# Patient Record
Sex: Female | Born: 1995 | Race: Black or African American | Hispanic: No | Marital: Single | Smoking: Never smoker
Health system: Southern US, Community
[De-identification: ages and names within clinical notes are randomized; demographics above are authoritative.]

## PROBLEM LIST (undated history)

## (undated) ENCOUNTER — Inpatient Hospital Stay (HOSPITAL_COMMUNITY): Payer: Self-pay

## (undated) DIAGNOSIS — Z789 Other specified health status: Secondary | ICD-10-CM

## (undated) DIAGNOSIS — R87629 Unspecified abnormal cytological findings in specimens from vagina: Secondary | ICD-10-CM

## (undated) DIAGNOSIS — F419 Anxiety disorder, unspecified: Secondary | ICD-10-CM

## (undated) DIAGNOSIS — Z8042 Family history of malignant neoplasm of prostate: Secondary | ICD-10-CM

## (undated) DIAGNOSIS — D649 Anemia, unspecified: Secondary | ICD-10-CM

## (undated) DIAGNOSIS — J45909 Unspecified asthma, uncomplicated: Secondary | ICD-10-CM

## (undated) HISTORY — PX: OTHER SURGICAL HISTORY: SHX169

## (undated) HISTORY — DX: Anemia, unspecified: D64.9

## (undated) HISTORY — DX: Anxiety disorder, unspecified: F41.9

## (undated) HISTORY — DX: Family history of malignant neoplasm of prostate: Z80.42

---
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
## 2013-02-11 ENCOUNTER — Inpatient Hospital Stay (HOSPITAL_COMMUNITY)
Admission: AD | Admit: 2013-02-11 | Discharge: 2013-02-12 | Disposition: A | Discharge status: Home / Self Care | Payer: Medicaid Other | Source: Ambulatory Visit | Attending: Obstetrics and Gynecology | Admitting: Obstetrics and Gynecology

## 2013-02-11 ENCOUNTER — Inpatient Hospital Stay (HOSPITAL_COMMUNITY): Payer: Medicaid Other

## 2013-02-11 DIAGNOSIS — N949 Unspecified condition associated with female genital organs and menstrual cycle: Secondary | ICD-10-CM | POA: Insufficient documentation

## 2013-02-11 DIAGNOSIS — O99891 Other specified diseases and conditions complicating pregnancy: Secondary | ICD-10-CM | POA: Insufficient documentation

## 2013-02-11 DIAGNOSIS — R109 Unspecified abdominal pain: Secondary | ICD-10-CM | POA: Insufficient documentation

## 2013-02-11 LAB — CBC
HCT: 35.2 % — ABNORMAL LOW (ref 36.0–46.0)
RDW: 14 % (ref 11.5–15.5)
WBC: 6.7 10*3/uL (ref 4.0–10.5)

## 2013-02-11 LAB — POCT PREGNANCY, URINE: Preg Test, Ur: POSITIVE — AB

## 2013-02-11 LAB — HCG, QUANTITATIVE, PREGNANCY: hCG, Beta Chain, Quant, S: 44210 m[IU]/mL — ABNORMAL HIGH (ref <5)

## 2013-02-11 NOTE — MAU Note (Signed)
Pt states she has pain on her left. Pt states pain started about 1 wk ago.

## 2013-02-11 NOTE — MAU Note (Signed)
Pt states she had a pos home UPT-and has pain on the left mid side area

## 2013-02-12 ENCOUNTER — Encounter (HOSPITAL_COMMUNITY): Payer: Self-pay | Admitting: *Deleted

## 2013-02-12 DIAGNOSIS — O9989 Other specified diseases and conditions complicating pregnancy, childbirth and the puerperium: Secondary | ICD-10-CM

## 2013-02-12 LAB — WET PREP, GENITAL

## 2013-02-12 NOTE — MAU Provider Note (Signed)
History     CSN: [id number]  Arrival date and time: 02/11/13 2209   None     Chief Complaint  Patient presents with  . Abdominal Pain   Abdominal Pain    GLENNYS SCHORSCH is a .17 y.o. G1P0000 at 5 weeks who presents today with cramping. She denies any vaginal bleeding. She is not sure where she is planning on going for [facility] and would like a list of providers.   Past Medical History  Diagnosis Date  . Medical history non-contributory     Past Surgical History  Procedure Laterality Date  . No past surgeries      No family history on file.  History  Substance Use Topics  . Smoking status: Never Smoker   . Smokeless tobacco: Not on file  . Alcohol Use: Not on file    Allergies: No Known Allergies  Prescriptions prior to admission  Medication Sig Dispense Refill  . ibuprofen (ADVIL,MOTRIN) 800 MG tablet Take 1 tablet (800 mg total) by mouth every 8 (eight) hours as needed for pain.  30 tablet  0    Review of Systems  Gastrointestinal: Positive for abdominal pain.   Physical Exam   Blood pressure 124/71, pulse 82, temperature 98.8 F (37.1 C), resp. rate 16, height 5\' 7"  (1.702 m), weight 78.926 kg (174 lb), last menstrual period 12/20/2012, SpO2 100.00%.  Physical Exam  Nursing note and vitals reviewed. Constitutional: She is oriented to person, place, and time. She appears well-developed and well-nourished. No distress.  Cardiovascular: Normal rate.   GI: Soft. There is no tenderness.  Genitourinary:  External: no lesion Vagina: small amount of white discharge Cervix: pink, smooth, no CMT Uterus: NSSC Adnexa: NT   Neurological: She is alert and oriented to person, place, and time.  Skin: Skin is warm and dry.  Psychiatric: She has a normal mood and affect.    MAU Course  Procedures Results for orders placed during the hospital encounter of 02/11/13 (from the past 24 hour(s))  POCT PREGNANCY, URINE     Status: Abnormal   Collection Time   02/11/13 10:40 PM      Result Value Range   Preg Test, Ur POSITIVE (*) NEGATIVE  CBC     Status: Abnormal   Collection Time    02/11/13 11:05 PM      Result Value Range   WBC 6.7  4.0 - 10.5 K/uL   RBC 4.21  3.87 - 5.11 MIL/uL   Hemoglobin 12.0  12.0 - 15.0 g/dL   HCT 40.9 (*) 81.1 - 91.4 %   MCV 83.6  78.0 - 100.0 fL   MCH 28.5  26.0 - 34.0 pg   MCHC 34.1  30.0 - 36.0 g/dL   RDW 78.2  95.6 - 21.3 %   Platelets 279  150 - 400 K/uL  ABO/RH     Status: None   Collection Time    02/11/13 11:05 PM      Result Value Range   ABO/RH(D) B POS    HCG, QUANTITATIVE, PREGNANCY     Status: Abnormal   Collection Time    02/11/13 11:05 PM      Result Value Range   hCG, Beta Chain, Quant, S 44210 (*) <5 mIU/mL  WET PREP, GENITAL     Status: Abnormal   Collection Time    02/12/13 12:20 AM      Result Value Range   Yeast Wet Prep HPF POC NONE SEEN  NONE SEEN  Trich, Wet Prep NONE SEEN  NONE SEEN   Clue Cells Wet Prep HPF POC FEW (*) NONE SEEN   WBC, Wet Prep HPF POC FEW (*) NONE SEEN   US Ob Comp Less 14 Wks  02/11/2013   *RADIOLOGY REPORT*  Clinical Data: Pain.  OBSTETRIC <14 WK ULTRASOUND  Technique:  Transabdominal ultrasound was performed for evaluation of the gestation as well as the maternal uterus and adnexal regions.  Comparison:  None.  Intrauterine gestational sac: Single intrauterine gestational sac. No concerning subchorionic hemorrhage.  Yolk sac: Present Embryo: Present Cardiac Activity: Not yet seen.  CRL:  2.6 mm  5 w  6 d        [place] EDC: 10/08/2013.  Maternal uterus/Adnexae: Symmetric and normal appearing ovaries.  There is a dominant follicle/cyst within the right ovary measuring 2.1 x 1.6 x 1.8 cm, compatible with corpus luteal cyst.  No significant free fluid.  IMPRESSION: 1. Single intrauterine gestation, 5 weeks 6 days gestational age by crown-rump length.  2. Normal appearing ovaries.   Original Report Authenticated By: Tiburcio Pea     Assessment and Plan   1.  Pelvic pain complicating pregnancy, antepartum, first trimester    Start [facility] as soon as possible List given First trimester danger signs reviewed Tawnya Crook 02/12/2013, 12:49 AM

## 2013-02-17 NOTE — MAU Provider Note (Signed)
Attestation of Attending Supervision of Advanced Practitioner (CNM/NP): Evaluation and management procedures were performed by the Advanced Practitioner under my supervision and collaboration.  I have reviewed the Advanced Practitioner's note and chart, and I agree with the management and plan.  Melbourne Jakubiak 02/17/2013 1:20 PM   

## 2013-03-09 ENCOUNTER — Inpatient Hospital Stay (HOSPITAL_COMMUNITY)
Admission: AD | Admit: 2013-03-09 | Discharge: 2013-03-09 | Disposition: A | Discharge status: Home / Self Care | Payer: Medicaid Other | Source: Ambulatory Visit | Attending: Obstetrics & Gynecology | Admitting: Obstetrics & Gynecology

## 2013-03-09 ENCOUNTER — Encounter (HOSPITAL_COMMUNITY): Payer: Self-pay | Admitting: *Deleted

## 2013-03-09 ENCOUNTER — Inpatient Hospital Stay (HOSPITAL_COMMUNITY): Payer: Medicaid Other

## 2013-03-09 DIAGNOSIS — O021 Missed abortion: Secondary | ICD-10-CM | POA: Insufficient documentation

## 2013-03-09 MED ORDER — HYDROCODONE-ACETAMINOPHEN 5-325 MG PO TABS: 1.0000 | ORAL_TABLET | ORAL | Status: DC | PRN

## 2013-03-09 MED ORDER — PROMETHAZINE HCL 25 MG PO TABS: ORAL_TABLET | ORAL | Status: DC

## 2013-03-09 MED ORDER — IBUPROFEN 800 MG PO TABS: 800.0000 mg | ORAL_TABLET | Freq: Three times a day (TID) | ORAL | Status: DC | PRN

## 2013-03-09 MED ORDER — MISOPROSTOL 200 MCG PO TABS: ORAL_TABLET | ORAL | Status: DC

## 2013-03-09 NOTE — MAU Provider Note (Signed)
CSN: [id number]     Arrival date & time 03/09/13  1256 History     None    Chief Complaint  Patient presents with  . Vaginal Bleeding   (Consider location/radiation/quality/duration/timing/severity/associated sxs/prior Treatment) HPI Erin Jacobs si a 17 y.o. G2P0000 at [redacted]w[redacted]d. She started having pink to dark spotting yesteday, has continued, is now bright red,still small amount. Has low abd cramping. No changes in discharge,odor or itching. No UTI S&S or GI changes. She had MAU visit 7/30 cultures were neg, U/S showed 5 6/7 wk IUGS with yolk sac. She has an appt 9/23 with [facility] to start [facility].   Past Medical History  Diagnosis Date  . Medical history non-contributory    Past Surgical History  Procedure Laterality Date  . No past surgeries     No family history on file. History  Substance Use Topics  . Smoking status: Never Smoker   . Smokeless tobacco: Not on file  . Alcohol Use: Not on file   OB History   Grav Para Term Preterm Abortions TAB SAB Ect Mult Living   2 0 0 0 0 0 0 0 0 0      Review of Systems  Constitutional: Positive for fatigue. Negative for fever and chills.  Gastrointestinal: Positive for abdominal pain. Negative for nausea, vomiting, diarrhea and constipation.  Genitourinary: Positive for vaginal bleeding. Negative for dysuria, urgency, frequency and vaginal discharge.    Allergies  Review of patient's allergies indicates no known allergies.  Home Medications  No current outpatient prescriptions on file. BP 117/72  Pulse 68  Temp(Src) 99.8 F (37.7 C)  Resp 18  SpO2 100%  LMP 12/20/2012 Physical Exam  Constitutional: She is oriented to person, place, and time. She appears well-developed and well-nourished.  Pulmonary/Chest: Effort normal.  Abdominal: Soft. There is no tenderness.  Genitourinary:  Pelvic exam- Ext genit- nl anatomy,skin intact Vagina- small amt dark red blood Cx- sl open, mucoid blood through os Ut- sl enlarged, non  tender Adn- no masses palp, nontender  Musculoskeletal: Normal range of motion.  Neurological: She is alert and oriented to person, place, and time.  Skin: Skin is warm and dry.  Psychiatric: She has a normal mood and affect. Her behavior is normal.    ED Course   Procedures (including critical care time)  Labs Reviewed - No data to display US Ob Transvaginal  03/09/2013   *RADIOLOGY REPORT*  Clinical Data: Pregnant, bleeding, cramping, no fetal heart tones  TRANSVAGINAL OB ULTRASOUND  Technique:  Transvaginal ultrasound was performed for evaluation of the gestation as well as the maternal uterus and adnexal regions.  Comparison: 02/11/2013  Findings: Intrauterine gestational sac:  Present, but irregular Yolk sac:  Present Embryo:  Present Cardiac activity:  Absent  CRL: 2.9 mm 6 wk  0 d  No subchorionic hemorrhage. Right ovary measures 4.6 x 2.0 x 2.2 cm and contains a small corpus luteal cyst. Left ovary normal size and morphology 2.8 x 2.0 x 0.9 cm. No adnexal masses. Small amount of nonspecific free pelvic fluid.  IMPRESSION: Intrauterine gestational sac and single embryo are again identified though the gestational sac is now irregular. No fetal cardiac activity is detected. In addition, there has been essentially no growth of the embryo since the prior ultrasound, 2.6 mm previously versus 2.9 mm currently. Findings are most consistent with a nonviable intrauterine pregnancy.   Original Report Authenticated By: Ulyses Southward, M.D.   No results found for this or any previous visit (from  the past 24 hour(s)).    MDM  ASSESSMENT: Missed abortion  PLAN: Options reviewed, pt will use Cytotec Return with heavy bleeding F/u GYN clinic

## 2013-03-09 NOTE — MAU Note (Addendum)
Bleeding since last night with wiping after void Will start [facility] Sept 23 at [facility]

## 2013-03-09 NOTE — MAU Note (Signed)
Phone given to pt to discuss with family her choice of cytotec or no intervention at this time

## 2013-03-11 ENCOUNTER — Encounter: Payer: Self-pay | Admitting: Advanced Practice Midwife

## 2013-03-11 ENCOUNTER — Telehealth: Payer: Self-pay | Admitting: Obstetrics & Gynecology

## 2013-03-11 NOTE — Telephone Encounter (Signed)
Called patient and she stated she had not called [facility], but was going to call them to follow up.

## 2013-03-27 ENCOUNTER — Encounter: Payer: Medicaid Other | Admitting: Obstetrics and Gynecology

## 2013-04-08 ENCOUNTER — Encounter: Payer: Self-pay | Admitting: Advanced Practice Midwife

## 2013-07-11 ENCOUNTER — Inpatient Hospital Stay (HOSPITAL_COMMUNITY): Payer: Self-pay

## 2013-07-11 ENCOUNTER — Inpatient Hospital Stay (HOSPITAL_COMMUNITY)
Admission: AD | Admit: 2013-07-11 | Discharge: 2013-07-11 | Disposition: A | Discharge status: Home / Self Care | Payer: Medicaid Other | Source: Ambulatory Visit | Attending: Obstetrics & Gynecology | Admitting: Obstetrics & Gynecology

## 2013-07-11 ENCOUNTER — Encounter (HOSPITAL_COMMUNITY): Payer: Self-pay | Admitting: *Deleted

## 2013-07-11 DIAGNOSIS — Z3490 Encounter for supervision of normal pregnancy, unspecified, unspecified trimester: Secondary | ICD-10-CM

## 2013-07-11 DIAGNOSIS — R1031 Right lower quadrant pain: Secondary | ICD-10-CM

## 2013-07-11 DIAGNOSIS — Z3201 Encounter for pregnancy test, result positive: Secondary | ICD-10-CM | POA: Insufficient documentation

## 2013-07-11 DIAGNOSIS — Z1389 Encounter for screening for other disorder: Secondary | ICD-10-CM

## 2013-07-11 DIAGNOSIS — R109 Unspecified abdominal pain: Secondary | ICD-10-CM | POA: Insufficient documentation

## 2013-07-11 DIAGNOSIS — N760 Acute vaginitis: Secondary | ICD-10-CM | POA: Insufficient documentation

## 2013-07-11 DIAGNOSIS — B9689 Other specified bacterial agents as the cause of diseases classified elsewhere: Secondary | ICD-10-CM | POA: Insufficient documentation

## 2013-07-11 DIAGNOSIS — A499 Bacterial infection, unspecified: Secondary | ICD-10-CM | POA: Insufficient documentation

## 2013-07-11 LAB — URINALYSIS, ROUTINE W REFLEX MICROSCOPIC
Glucose, UA: NEGATIVE mg/dL
Ketones, ur: NEGATIVE mg/dL
Leukocytes, UA: NEGATIVE
pH: 6 (ref 5.0–8.0)

## 2013-07-11 LAB — CBC
HCT: 35.5 % — ABNORMAL LOW (ref 36.0–46.0)
Hemoglobin: 11.8 g/dL — ABNORMAL LOW (ref 12.0–15.0)
MCH: 27.1 pg (ref 26.0–34.0)
MCHC: 33.2 g/dL (ref 30.0–36.0)
MCV: 81.4 fL (ref 78.0–100.0)
RDW: 14.4 % (ref 11.5–15.5)
WBC: 7 10*3/uL (ref 4.0–10.5)

## 2013-07-11 LAB — OB RESULTS CONSOLE GC/CHLAMYDIA
Chlamydia: NEGATIVE
Gonorrhea: NEGATIVE

## 2013-07-11 LAB — WET PREP, GENITAL
Trich, Wet Prep: NONE SEEN
Yeast Wet Prep HPF POC: NONE SEEN

## 2013-07-11 LAB — HCG, QUANTITATIVE, PREGNANCY: hCG, Beta Chain, Quant, S: 8587 m[IU]/mL — ABNORMAL HIGH (ref <5)

## 2013-07-11 LAB — POCT PREGNANCY, URINE: Preg Test, Ur: POSITIVE — AB

## 2013-07-11 MED ORDER — METRONIDAZOLE 500 MG PO TABS: 500.0000 mg | ORAL_TABLET | Freq: Two times a day (BID) | ORAL | Status: DC

## 2013-07-11 NOTE — MAU Provider Note (Signed)
History     CSN: [id number]  Arrival date and time: 07/11/13 1625   First Provider Initiated Contact with Patient 07/11/13 1757      Chief Complaint  Patient presents with  . Abdominal Pain  . Possible Pregnancy   HPI This is a 17 y.o. Jacobs at [redacted]w[redacted]d who presents with report of positive pregnancy test. Has pain in right middle abdomen.  Has been seen for this before with negative findings. Denies bleeding or pelvic pain. Was seen here this summer for an IUP which subsequently ended in miscarriage.  RN Note: Patient states she had a positive home pregnancy test. Has been having pain at the waist level on the right side and mid abdomen since she missed her last period. Denies bleeding, discharge, nausea or vomiting.       OB History   Grav Para Term Preterm Abortions TAB SAB Ect Mult Living   2 0 0 0 1 0 1 0 0 0       Past Medical History  Diagnosis Date  . Medical history non-contributory     Past Surgical History  Procedure Laterality Date  . No past surgeries      No family history on file.  History  Substance Use Topics  . Smoking status: Never Smoker   . Smokeless tobacco: Not on file  . Alcohol Use: No    Allergies: No Known Allergies  Prescriptions prior to admission  Medication Sig Dispense Refill  . HYDROcodone-acetaminophen (NORCO/VICODIN) 5-325 MG per tablet Take 1 tablet by mouth every 4 (four) hours as needed for pain.  10 tablet  0  . ibuprofen (ADVIL,MOTRIN) 800 MG tablet Take 1 tablet (800 mg total) by mouth every 8 (eight) hours as needed for pain.  21 tablet  0  . misoprostol (CYTOTEC) 200 MCG tablet 800 mcg placed intravaginally at night  4 tablet  0  . promethazine (PHENERGAN) 25 MG tablet 1 po Q 4-6 hr prn N&V  10 tablet  0    Review of Systems  Constitutional: Negative for fever, chills and malaise/fatigue.  Gastrointestinal: Positive for abdominal pain (intermittend, right middle abdomen). Negative for nausea, vomiting, diarrhea and  constipation.  Genitourinary: Negative for dysuria.  Neurological: Negative for dizziness and headaches.   Physical Exam   Blood pressure 117/69, pulse 77, temperature 99.6 F (37.6 C), temperature source Oral, resp. rate 16, height 5\' 6"  (1.676 m), weight 85.095 kg (187 lb 9.6 oz), last menstrual period 05/24/2013, SpO2 100.00%.  Physical Exam  Constitutional: She is oriented to person, place, and time. She appears well-developed and well-nourished. No distress.  HENT:  Head: Normocephalic.  Cardiovascular: Normal rate, regular rhythm and normal heart sounds.   Respiratory: Effort normal and breath sounds normal. No respiratory distress.  GI: Soft. She exhibits no distension and no mass. There is no tenderness (nontender now). There is no rebound and no guarding.  Genitourinary: Uterus normal. Vaginal discharge (thin white discharge) found.  Musculoskeletal: Normal range of motion.  Neurological: She is alert and oriented to person, place, and time.  Skin: Skin is warm and dry.  Psychiatric: She has a normal mood and affect.   Results for orders placed during the hospital encounter of 07/11/13 (from the past 24 hour(s))  URINALYSIS, ROUTINE W REFLEX MICROSCOPIC     Status: Abnormal   Collection Time    07/11/13  5:05 PM      Result Value Range   Color, Urine YELLOW  YELLOW   APPearance CLEAR  CLEAR   Specific Gravity, Urine >1.030 (*) 1.005 - 1.030   pH 6.0  5.0 - 8.0   Glucose, UA NEGATIVE  NEGATIVE mg/dL   Hgb urine dipstick NEGATIVE  NEGATIVE   Bilirubin Urine NEGATIVE  NEGATIVE   Ketones, ur NEGATIVE  NEGATIVE mg/dL   Protein, ur NEGATIVE  NEGATIVE mg/dL   Urobilinogen, UA 0.2  0.0 - 1.0 mg/dL   Nitrite NEGATIVE  NEGATIVE   Leukocytes, UA NEGATIVE  NEGATIVE  POCT PREGNANCY, URINE     Status: Abnormal   Collection Time    07/11/13  5:23 PM      Result Value Range   Preg Test, Ur POSITIVE (*) NEGATIVE  WET PREP, GENITAL     Status: Abnormal   Collection Time     07/11/13  6:05 PM      Result Value Range   Yeast Wet Prep HPF POC NONE SEEN  NONE SEEN   Trich, Wet Prep NONE SEEN  NONE SEEN   Clue Cells Wet Prep HPF POC FEW (*) NONE SEEN   WBC, Wet Prep HPF POC FEW (*) NONE SEEN  HCG, QUANTITATIVE, PREGNANCY     Status: Abnormal   Collection Time    07/11/13  6:07 PM      Result Value Range   hCG, Beta Chain, Quant, S 8587 (*) <5 mIU/mL  CBC     Status: Abnormal   Collection Time    07/11/13  6:07 PM      Result Value Range   WBC 7.0  4.0 - 10.5 K/uL   RBC 4.36  3.87 - 5.11 MIL/uL   Hemoglobin 11.8 (*) 12.0 - 15.0 g/dL   HCT 81.1 (*) 91.4 - 78.2 %   MCV 81.4  78.0 - 100.0 fL   MCH 27.1  26.0 - 34.0 pg   MCHC 33.2  30.0 - 36.0 g/dL   RDW 95.6  21.3 - 08.6 %   Platelets 296  150 - 400 K/uL    MAU Course  Procedures  MDM Will check quant HCG and [place]  [place]: SIUP at 6.2 weeks with [facility] 03/04/14 FHR 120 Right CLC Trace free fluid  Assessment and Plan  A:  SIUP at 6.2 weeks      Nonspecific abdominal pain with negative findings      Mild BV  P:  Discharge home       Start prenatal care        Rx Flagyl   Medication List         metroNIDAZOLE 500 MG tablet  Commonly known as:  FLAGYL  Take 1 tablet (500 mg total) by mouth 2 (two) times daily.         [facility] 07/11/2013, 6:38 PM

## 2013-07-11 NOTE — MAU Note (Signed)
Patient states she had a positive home pregnancy test. Has been having pain at the waist level on the right side and mid abdomen since she missed her last period. Denies bleeding, discharge, nausea or vomiting.

## 2013-07-13 LAB — GC/CHLAMYDIA PROBE AMP
CT Probe RNA: NEGATIVE
GC Probe RNA: NEGATIVE

## 2013-07-14 NOTE — MAU Provider Note (Signed)
Attestation of Attending Supervision of Advanced Practitioner (CNM/NP): Evaluation and management procedures were performed by the Advanced Practitioner under my supervision and collaboration.  I have reviewed the Advanced Practitioner's note and chart, and I agree with the management and plan.  HARRAWAY-SMITH, Nereyda Bowler 4:35 PM

## 2013-07-17 NOTE — L&D Delivery Note (Signed)
Delivery Note At 9:52 PM a viable female was delivered via Vaginal, Spontaneous Delivery (Presentation: ; Occiput Anterior).  APGAR: 8, 9; weight 6 lb 6.8 oz (2914 g).   Placenta status: Intact, Spontaneous.  Cord:  with the following complications: .  Cord pH: 7.40  Anesthesia: Epidural  Episiotomy: None Lacerations:None Suture Repair: None Est. Blood Loss (mL): 300  Mom to postpartum.  Baby to Couplet care / Skin to Skin.  Colbe Viviano A 03/10/2014, 10:38 PM

## 2013-07-29 ENCOUNTER — Encounter: Payer: Medicaid Other | Admitting: Obstetrics

## 2013-08-15 ENCOUNTER — Ambulatory Visit (INDEPENDENT_AMBULATORY_CARE_PROVIDER_SITE_OTHER): Payer: Medicaid Other | Admitting: Advanced Practice Midwife

## 2013-08-15 ENCOUNTER — Encounter: Payer: Self-pay | Admitting: Advanced Practice Midwife

## 2013-08-15 VITALS — BP 118/73 | Temp 97.5°F | Wt 189.0 lb

## 2013-08-15 DIAGNOSIS — Z3400 Encounter for supervision of normal first pregnancy, unspecified trimester: Secondary | ICD-10-CM | POA: Insufficient documentation

## 2013-08-15 NOTE — Progress Notes (Signed)
Pulse- 91  Subjective:    Erin Jacobs is being seen today for her first obstetrical visit.  This is not a planned pregnancy. She is at [redacted]w[redacted]d gestation. Her obstetrical history is significant for none. Relationship with FOB: significant other, not living together. Patient does intend to breast feed. Pregnancy history fully reviewed.  Patient had a SAB, 1 period and then became pregnant.   Menstrual History: OB History   Grav Para Term Preterm Abortions TAB SAB Ect Mult Living   2 0 0 0 1 0 1 0 0 0       Menarche age: 25  Patient's last menstrual period was 05/24/2013.    The following portions of the patient's history were reviewed and updated as appropriate: allergies, current medications, past family history, past medical history, past social history, past surgical history and problem list.  Review of Systems Pertinent items are noted in HPI.    Objective:    BP 118/73  Temp(Src) 97.5 F (36.4 C)  Wt 189 lb (85.73 kg)  LMP 05/24/2013 General appearance: alert and cooperative Head: Normocephalic, without obvious abnormality, atraumatic Eyes: conjunctivae/corneas clear. PERRL, EOM's intact. Fundi benign. Ears: normal TM's and external ear canals both ears Nose: Nares normal. Septum midline. Mucosa normal. No drainage or sinus tenderness. Throat: lips, mucosa, and tongue normal; teeth and gums normal Neck: no adenopathy, no carotid bruit, no JVD, supple, symmetrical, trachea midline and thyroid not enlarged, symmetric, no tenderness/mass/nodules Back: symmetric, no curvature. ROM normal. No CVA tenderness. Lungs: clear to auscultation bilaterally Breasts: normal appearance, no masses or tenderness Heart: regular rate and rhythm, S1, S2 normal, no murmur, click, rub or gallop Abdomen: soft, non-tender; bowel sounds normal; no masses,  no organomegaly Extremities: extremities normal, atraumatic, no cyanosis or edema Pulses: 2+ and symmetric Skin: Skin color, texture, turgor  normal. No rashes or lesions Lymph nodes: Cervical, supraclavicular, and axillary nodes normal. Neurologic: Alert and oriented X 3, normal strength and tone. Normal symmetric reflexes. Normal coordination and gait    Assessment:    Pregnancy at [redacted]w[redacted]d weeks  by 6 week [place]   Plan:    Initial labs drawn. Prenatal vitamins.  Counseling provided regarding continued use of seat belts, cessation of alcohol consumption, smoking or use of illicit drugs; infection precautions i.e., influenza/TDAP immunizations, toxoplasmosis,CMV, parvovirus, listeria and varicella; workplace safety, exercise during pregnancy; routine dental care, safe medications, sexual activity, hot tubs, saunas, pools, travel, caffeine use, fish and methlymercury, potential toxins, hair treatments, varicose veins Weight gain recommendations per IOM guidelines reviewed: underweight/BMI< 18.5--> gain 28 - 40 lbs; normal weight/BMI 18.5 - 24.9--> gain 25 - 35 lbs; overweight/BMI 25 - 29.9--> gain 15 - 25 lbs; obese/BMI >30->gain  11 - 20 lbs Problem list reviewed and updated. FIRST/CF mutation testing/NIPT/QUAD SCREEN discussed: plan NV. Role of ultrasound in pregnancy discussed; fetal survey: requested. Amniocentesis discussed: not indicated.  Follow up in 4 weeks. 80% of 50 min visit spent on counseling and coordination of care.    Amy Roni Bread CNM

## 2013-08-16 LAB — VARICELLA ZOSTER ANTIBODY, IGG: VARICELLA IGG: 77.68 {index} (ref <135.00)

## 2013-08-16 LAB — OBSTETRIC PANEL
Antibody Screen: NEGATIVE
BASOS ABS: 0 10*3/uL (ref 0.0–0.1)
Basophils Relative: 1 % (ref 0–1)
Eosinophils Absolute: 0.4 10*3/uL (ref 0.0–0.7)
Eosinophils Relative: 9 % — ABNORMAL HIGH (ref 0–5)
HCT: 37.3 % (ref 36.0–46.0)
HEMOGLOBIN: 12.5 g/dL (ref 12.0–15.0)
HEP B S AG: NEGATIVE
LYMPHS PCT: 41 % (ref 12–46)
Lymphs Abs: 2.1 10*3/uL (ref 0.7–4.0)
MCH: 27.7 pg (ref 26.0–34.0)
MCHC: 33.5 g/dL (ref 30.0–36.0)
MCV: 82.5 fL (ref 78.0–100.0)
MONOS PCT: 8 % (ref 3–12)
Monocytes Absolute: 0.4 10*3/uL (ref 0.1–1.0)
NEUTROS ABS: 2.1 10*3/uL (ref 1.7–7.7)
Neutrophils Relative %: 41 % — ABNORMAL LOW (ref 43–77)
Platelets: 363 10*3/uL (ref 150–400)
RBC: 4.52 MIL/uL (ref 3.87–5.11)
RDW: 15.4 % (ref 11.5–15.5)
Rh Type: POSITIVE
Rubella: 14 Index — ABNORMAL HIGH (ref <0.90)
WBC: 5.1 10*3/uL (ref 4.0–10.5)

## 2013-08-16 LAB — CULTURE, OB URINE
Colony Count: NO GROWTH
Organism ID, Bacteria: NO GROWTH

## 2013-08-16 LAB — HIV ANTIBODY (ROUTINE TESTING W REFLEX): HIV: NONREACTIVE

## 2013-08-16 LAB — VITAMIN D 25 HYDROXY (VIT D DEFICIENCY, FRACTURES): Vit D, 25-Hydroxy: 10 ng/mL — ABNORMAL LOW (ref 30–89)

## 2013-08-19 LAB — HEMOGLOBINOPATHY EVALUATION
HEMOGLOBIN OTHER: 0 %
HGB F QUANT: 0 % (ref 0.0–2.0)
Hgb A2 Quant: 2.1 % — ABNORMAL LOW (ref 2.2–3.2)
Hgb A: 97.9 % — ABNORMAL HIGH (ref 96.8–97.8)
Hgb S Quant: 0 %

## 2013-09-09 ENCOUNTER — Ambulatory Visit (INDEPENDENT_AMBULATORY_CARE_PROVIDER_SITE_OTHER): Payer: Medicaid Other | Admitting: Advanced Practice Midwife

## 2013-09-09 VITALS — BP 125/77 | Wt 188.0 lb

## 2013-09-09 DIAGNOSIS — Z3400 Encounter for supervision of normal first pregnancy, unspecified trimester: Secondary | ICD-10-CM

## 2013-09-09 LAB — POCT URINALYSIS DIPSTICK
BILIRUBIN UA: NEGATIVE
Glucose, UA: NEGATIVE
KETONES UA: NEGATIVE
Leukocytes, UA: NEGATIVE
Nitrite, UA: NEGATIVE
PH UA: 6
PROTEIN UA: NEGATIVE
RBC UA: NEGATIVE
SPEC GRAV UA: 1.02
Urobilinogen, UA: NEGATIVE

## 2013-09-09 MED ORDER — OB COMPLETE PETITE 35-5-1-200 MG PO CAPS: 1.0000 | ORAL_CAPSULE | Freq: Every day | ORAL | Status: DC

## 2013-09-09 NOTE — Progress Notes (Signed)
Subjective: Erin Jacobs is a 18 y.o. at 14 weeks by 6 week [place]  Patient denies vaginal leaking of fluid or bleeding, denies contractions.  Reports positive fetal movment.  Denies concerns today.  Objective: Filed Vitals:   09/09/13 1358  BP: 125/77   140 FHR 1/2 U and SP Fundal Height Fetal Position NA  Assessment: Patient Active Problem List   Diagnosis Date Noted  . Supervision of normal first pregnancy 08/15/2013    Plan: Patient to return to clinic in 4 weeks Quad today Rx for PNV sent Patient to have ROS [place] in 4-6 weeks Reviewed warning signs in pregnancy. Patient to call with concerns PRN. Reviewed triage location.  20 min spent with patient greater than 80% spent in counseling and coordination of care.  Tatanisha Cuthbert Roni Bread CNM

## 2013-09-09 NOTE — Progress Notes (Signed)
Pulse 92 Pt states that she is having some stress incontinence, when coughing or sneezing.

## 2013-09-10 LAB — AFP, QUAD SCREEN
AFP: 36.5 IU/mL
CURR GEST AGE: 14.6 wks.days
HCG, Total: 42515 m[IU]/mL
INH: 168.7 pg/mL
Interpretation-AFP: NEGATIVE
MOM FOR HCG: 1.51
MOM FOR INH: 0.9
MoM for AFP: 1.42
Open Spina bifida: NEGATIVE
TRI 18 SCR RISK EST: NEGATIVE
uE3 Mom: 1.44
uE3 Value: 0.4 ng/mL

## 2013-10-06 ENCOUNTER — Other Ambulatory Visit: Payer: Self-pay | Admitting: *Deleted

## 2013-10-06 DIAGNOSIS — O3680X0 Pregnancy with inconclusive fetal viability, not applicable or unspecified: Secondary | ICD-10-CM

## 2013-10-07 ENCOUNTER — Other Ambulatory Visit: Payer: Self-pay | Admitting: Obstetrics & Gynecology

## 2013-10-07 ENCOUNTER — Ambulatory Visit (INDEPENDENT_AMBULATORY_CARE_PROVIDER_SITE_OTHER): Payer: Medicaid Other | Admitting: Advanced Practice Midwife

## 2013-10-07 ENCOUNTER — Ambulatory Visit (INDEPENDENT_AMBULATORY_CARE_PROVIDER_SITE_OTHER): Payer: Medicaid Other

## 2013-10-07 VITALS — BP 115/74 | Temp 98.6°F | Wt 196.0 lb

## 2013-10-07 DIAGNOSIS — O99210 Obesity complicating pregnancy, unspecified trimester: Secondary | ICD-10-CM

## 2013-10-07 DIAGNOSIS — Z1389 Encounter for screening for other disorder: Secondary | ICD-10-CM

## 2013-10-07 DIAGNOSIS — Z363 Encounter for antenatal screening for malformations: Secondary | ICD-10-CM

## 2013-10-07 DIAGNOSIS — Z3400 Encounter for supervision of normal first pregnancy, unspecified trimester: Secondary | ICD-10-CM

## 2013-10-07 LAB — POCT URINALYSIS DIPSTICK
Bilirubin, UA: NEGATIVE
Blood, UA: NEGATIVE
GLUCOSE UA: NEGATIVE
Ketones, UA: NEGATIVE
NITRITE UA: NEGATIVE
Protein, UA: NEGATIVE
SPEC GRAV UA: 1.025
Urobilinogen, UA: NEGATIVE
pH, UA: 5

## 2013-10-07 MED ORDER — SELECT-OB 29-0.6-0.4 MG PO CHEW: 1.0000 | CHEWABLE_TABLET | Freq: Every day | ORAL | Status: DC

## 2013-10-07 NOTE — Progress Notes (Signed)
Subjective: Erin Jacobs is a 18 y.o. at 18 weeks by early ultrasound  Patient denies vaginal leaking of fluid or bleeding, denies contractions.  Reports negative fetal movment.  Denies concerns today.  Objective: Filed Vitals:   10/07/13 1503  BP: 115/74  Temp: 98.6 F (37 C)   160 FHR Below U Fundal Height Fetal Position unknown  Assessment: Patient Active Problem List   Diagnosis Date Noted  . Supervision of normal first pregnancy 08/15/2013    Plan: Patient to return to clinic in 4 weeks Schedule Glucose test NV Reviewed lab results today Encouraged patient to get vision screening Gave Rx for chewable vitamin and samples. Reviewed warning signs in pregnancy. Patient to call with concerns PRN. Reviewed triage location.  15 min spent with patient greater than 80% spent in counseling and coordination of care.   Ellouise Mcwhirter Roni Bread CNM

## 2013-10-07 NOTE — Progress Notes (Signed)
Pulse:89 Patient has been taking samples of Prenatal vitamins that we gave her. Patient did not know we sent Prenatals to her pharmacy but would like to know if we could prescribe her a gummy vitamin. Patient states the pill she is taking is hard to swallow and makes her want to throw it back up. Patient states she is having pressure in her lower abdomin. Patient states she is having lower back pain.

## 2013-11-04 ENCOUNTER — Encounter: Payer: Self-pay | Admitting: Advanced Practice Midwife

## 2013-11-04 ENCOUNTER — Ambulatory Visit (INDEPENDENT_AMBULATORY_CARE_PROVIDER_SITE_OTHER): Payer: Medicaid Other | Admitting: Advanced Practice Midwife

## 2013-11-04 VITALS — BP 137/73 | HR 95 | Temp 98.3°F | Wt 203.0 lb

## 2013-11-04 DIAGNOSIS — Z3400 Encounter for supervision of normal first pregnancy, unspecified trimester: Secondary | ICD-10-CM

## 2013-11-04 LAB — POCT URINALYSIS DIPSTICK
Bilirubin, UA: NEGATIVE
Blood, UA: NEGATIVE
Glucose, UA: NEGATIVE
KETONES UA: NEGATIVE
LEUKOCYTES UA: NEGATIVE
Nitrite, UA: NEGATIVE
PH UA: 5
Protein, UA: NEGATIVE
SPEC GRAV UA: 1.025
Urobilinogen, UA: NEGATIVE

## 2013-11-04 LAB — US OB COMP + 14 WK

## 2013-11-04 NOTE — Progress Notes (Signed)
Subjective: Erin Jacobs is a 18 y.o. at 22 weeks by early ultrasound  Patient denies vaginal leaking of fluid or bleeding, denies contractions.  Reports positive fetal movment.  Denies concerns today.  Objective: Filed Vitals:   11/04/13 0922  BP: 137/73  Pulse: 95  Temp: 98.3 F (36.8 C)   150 FHR 22 Fundal Height Fetal Position NA  Assessment: Patient Active Problem List   Diagnosis Date Noted  . Supervision of normal first pregnancy 08/15/2013    Plan: Patient to return to clinic in 4 weeks Repeat US NV for suboptimal view of spine. 2 hour GCT NV, explained to patient. Reviewed warning signs in pregnancy. Patient to call with concerns PRN. Reviewed triage location.  Mekhia Brogan Roni Bread CNM

## 2013-11-28 ENCOUNTER — Other Ambulatory Visit: Payer: Self-pay | Admitting: *Deleted

## 2013-11-28 DIAGNOSIS — O365990 Maternal care for other known or suspected poor fetal growth, unspecified trimester, not applicable or unspecified: Secondary | ICD-10-CM

## 2013-12-02 ENCOUNTER — Ambulatory Visit (INDEPENDENT_AMBULATORY_CARE_PROVIDER_SITE_OTHER): Payer: Medicaid Other | Admitting: Advanced Practice Midwife

## 2013-12-02 ENCOUNTER — Other Ambulatory Visit: Payer: Medicaid Other

## 2013-12-02 ENCOUNTER — Ambulatory Visit (INDEPENDENT_AMBULATORY_CARE_PROVIDER_SITE_OTHER): Payer: Medicaid Other

## 2013-12-02 ENCOUNTER — Encounter: Payer: Self-pay | Admitting: Advanced Practice Midwife

## 2013-12-02 VITALS — BP 125/70 | HR 102 | Temp 98.9°F | Wt 210.0 lb

## 2013-12-02 DIAGNOSIS — Z3400 Encounter for supervision of normal first pregnancy, unspecified trimester: Secondary | ICD-10-CM

## 2013-12-02 DIAGNOSIS — O365990 Maternal care for other known or suspected poor fetal growth, unspecified trimester, not applicable or unspecified: Secondary | ICD-10-CM

## 2013-12-02 DIAGNOSIS — Z3480 Encounter for supervision of other normal pregnancy, unspecified trimester: Secondary | ICD-10-CM

## 2013-12-02 LAB — POCT URINALYSIS DIPSTICK
Bilirubin, UA: NEGATIVE
GLUCOSE UA: NEGATIVE
KETONES UA: NEGATIVE
LEUKOCYTES UA: NEGATIVE
NITRITE UA: NEGATIVE
PH UA: 7
Protein, UA: NEGATIVE
Spec Grav, UA: 1.015
Urobilinogen, UA: NEGATIVE

## 2013-12-02 LAB — CBC
HCT: 31.2 % — ABNORMAL LOW (ref 36.0–46.0)
HEMOGLOBIN: 10.4 g/dL — AB (ref 12.0–15.0)
MCH: 26.9 pg (ref 26.0–34.0)
MCHC: 33.3 g/dL (ref 30.0–36.0)
MCV: 80.6 fL (ref 78.0–100.0)
Platelets: 355 10*3/uL (ref 150–400)
RBC: 3.87 MIL/uL (ref 3.87–5.11)
RDW: 15.1 % (ref 11.5–15.5)
WBC: 9.5 10*3/uL (ref 4.0–10.5)

## 2013-12-02 LAB — US OB FOLLOW UP

## 2013-12-02 NOTE — Progress Notes (Signed)
Subjective: Erin Jacobs is a 18 y.o. at 26 weeks by 6 week [place]  Patient denies vaginal leaking of fluid or bleeding, denies contractions.  Reports positive fetal movment.  Denies concerns today.  Objective: Filed Vitals:   12/02/13 0950  BP: 125/70  Pulse: 102  Temp: 98.9 F (37.2 C)   150 FHR 26 Fundal Height Fetal Position unknown  Assessment: Patient Active Problem List   Diagnosis Date Noted  . Supervision of normal first pregnancy 08/15/2013    Plan: Patient to return to clinic in 2 weeks GCT NV Repeat US done today, review NV Reviewed warning signs in pregnancy. Patient to call with concerns PRN. Reviewed triage location.  Chasady Longwell Roni Bread CNM

## 2013-12-03 LAB — GLUCOSE TOLERANCE, 2 HOURS W/ 1HR
GLUCOSE, 2 HOUR: 94 mg/dL (ref 70–139)
Glucose, 1 hour: 100 mg/dL (ref 70–170)
Glucose, Fasting: 75 mg/dL (ref 70–99)

## 2013-12-03 LAB — RPR

## 2013-12-03 LAB — HIV ANTIBODY (ROUTINE TESTING W REFLEX): HIV 1&2 Ab, 4th Generation: NONREACTIVE

## 2013-12-10 ENCOUNTER — Telehealth: Payer: Self-pay | Admitting: *Deleted

## 2013-12-30 ENCOUNTER — Encounter: Payer: Self-pay | Admitting: Advanced Practice Midwife

## 2014-01-02 ENCOUNTER — Encounter: Payer: Self-pay | Admitting: Advanced Practice Midwife

## 2014-01-09 ENCOUNTER — Ambulatory Visit (INDEPENDENT_AMBULATORY_CARE_PROVIDER_SITE_OTHER): Payer: Medicaid Other | Admitting: Obstetrics

## 2014-01-09 VITALS — BP 123/77 | HR 102 | Temp 98.8°F | Wt 216.0 lb

## 2014-01-09 DIAGNOSIS — Z3400 Encounter for supervision of normal first pregnancy, unspecified trimester: Secondary | ICD-10-CM

## 2014-01-09 DIAGNOSIS — Z3403 Encounter for supervision of normal first pregnancy, third trimester: Secondary | ICD-10-CM

## 2014-01-09 LAB — POCT URINALYSIS DIPSTICK
Bilirubin, UA: NEGATIVE
GLUCOSE UA: NEGATIVE
Ketones, UA: NEGATIVE
LEUKOCYTES UA: NEGATIVE
NITRITE UA: NEGATIVE
Protein, UA: NEGATIVE
RBC UA: NEGATIVE
Spec Grav, UA: 1.02
UROBILINOGEN UA: NEGATIVE
pH, UA: 5

## 2014-01-10 ENCOUNTER — Encounter: Payer: Self-pay | Admitting: Obstetrics

## 2014-01-10 NOTE — Progress Notes (Signed)
Subjective:    Erin Jacobs is a 18 y.o. female being seen today for her obstetrical visit. She is at [redacted]w[redacted]d gestation. Patient reports no complaints. Fetal movement: normal.  Problem List Items Addressed This Visit   Supervision of normal first pregnancy - Primary   Relevant Orders      POCT urinalysis dipstick (Completed)     Patient Active Problem List   Diagnosis Date Noted  . Supervision of normal first pregnancy 08/15/2013   Objective:    BP 123/77  Pulse 102  Temp(Src) 98.8 F (37.1 C)  Wt 216 lb (97.977 kg)  LMP 05/24/2013 FHT:  150 BPM  Uterine Size: size equals dates  Presentation: unsure     Assessment:    Pregnancy @ [redacted]w[redacted]d weeks   Plan:     labs reviewed, problem list updated Consent signed. GBS sent TDAP offered  Rhogam given for RH negative Pediatrician: discussed. Infant feeding: plans to breastfeed. Maternity leave: not discussed. Cigarette smoking: never smoked. Orders Placed This Encounter  Procedures  . POCT urinalysis dipstick   No orders of the defined types were placed in this encounter.   Follow up in 2 Weeks.

## 2014-01-27 ENCOUNTER — Ambulatory Visit (INDEPENDENT_AMBULATORY_CARE_PROVIDER_SITE_OTHER): Payer: Medicaid Other | Admitting: Advanced Practice Midwife

## 2014-01-27 VITALS — BP 115/77 | HR 101 | Temp 98.0°F

## 2014-01-27 DIAGNOSIS — Z3403 Encounter for supervision of normal first pregnancy, third trimester: Secondary | ICD-10-CM

## 2014-01-27 DIAGNOSIS — Z3400 Encounter for supervision of normal first pregnancy, unspecified trimester: Secondary | ICD-10-CM

## 2014-01-27 NOTE — Progress Notes (Signed)
Subjective: Erin Jacobs is a 18 y.o. at 34 weeks by 6 wk [place]  Patient denies vaginal leaking of fluid or bleeding, denies contractions.  Reports positive fetal movment.  Denies concerns today.  Objective: Filed Vitals:   01/27/14 1402  BP: 115/77  Pulse: 101  Temp: 98 F (36.7 C)   150 FHR 34 Fundal Height Fetal Position cephalic, confirmed by [place]  Assessment: Patient Active Problem List   Diagnosis Date Noted  . Supervision of normal first pregnancy 08/15/2013    Plan: Patient to return to clinic in 2 weeks GBS today Gave Nexplanon and pediatrician handouts Reviewed warning signs in pregnancy. Patient to call with concerns PRN. Reviewed triage location.  Terin Cragle Roni Bread CNM

## 2014-01-28 LAB — POCT URINALYSIS DIPSTICK
Bilirubin, UA: NEGATIVE
Blood, UA: NEGATIVE
Glucose, UA: NEGATIVE
KETONES UA: NEGATIVE
LEUKOCYTES UA: NEGATIVE
Nitrite, UA: NEGATIVE
PH UA: 6
Spec Grav, UA: 1.02
UROBILINOGEN UA: NEGATIVE

## 2014-01-28 NOTE — Addendum Note (Signed)
Addended by: Ladona Ridgel on: 01/28/2014 06:42 PM   Modules accepted: Orders

## 2014-01-29 LAB — STREP B DNA PROBE: GBSP: NOT DETECTED

## 2014-02-10 ENCOUNTER — Encounter: Payer: Self-pay | Admitting: Obstetrics

## 2014-02-10 ENCOUNTER — Ambulatory Visit (INDEPENDENT_AMBULATORY_CARE_PROVIDER_SITE_OTHER): Payer: Medicaid Other | Admitting: Obstetrics

## 2014-02-10 VITALS — BP 130/60 | Temp 98.2°F | Wt 218.0 lb

## 2014-02-10 DIAGNOSIS — Z3403 Encounter for supervision of normal first pregnancy, third trimester: Secondary | ICD-10-CM

## 2014-02-10 DIAGNOSIS — Z3400 Encounter for supervision of normal first pregnancy, unspecified trimester: Secondary | ICD-10-CM

## 2014-02-10 LAB — POCT URINALYSIS DIPSTICK
Glucose, UA: NEGATIVE
Ketones, UA: NEGATIVE
NITRITE UA: NEGATIVE
RBC UA: NEGATIVE
Spec Grav, UA: 1.02
Urobilinogen, UA: NEGATIVE
pH, UA: 6

## 2014-02-10 NOTE — Progress Notes (Signed)
Subjective:    Erin Jacobs is a 18 y.o. female being seen today for her obstetrical visit. She is at [redacted]w[redacted]d gestation. Patient reports no complaints. Fetal movement: normal.  Problem List Items Addressed This Visit   Supervision of normal first pregnancy - Primary   Relevant Orders      POCT urinalysis dipstick     Patient Active Problem List   Diagnosis Date Noted  . Supervision of normal first pregnancy 08/15/2013   Objective:    BP 130/60  Temp(Src) 98.2 F (36.8 C)  Wt 218 lb (98.884 kg)  LMP 05/24/2013 FHT:  150 BPM  Uterine Size: size equals dates  Presentation: unsure     Assessment:    Pregnancy @ [redacted]w[redacted]d weeks   Plan:     labs reviewed, problem list updated Consent signed. GBS sent TDAP offered  Rhogam given for RH negative Pediatrician: discussed. Infant feeding: plans to breastfeed. Maternity leave: discussed. Cigarette smoking: quit 2012. Orders Placed This Encounter  Procedures  . POCT urinalysis dipstick   No orders of the defined types were placed in this encounter.   Follow up in 1 Week.

## 2014-02-11 NOTE — Telephone Encounter (Signed)
Error

## 2014-02-15 ENCOUNTER — Inpatient Hospital Stay (HOSPITAL_COMMUNITY)
Admission: AD | Admit: 2014-02-15 | Discharge: 2014-02-16 | Disposition: A | Discharge status: Home / Self Care | Payer: Medicaid Other | Source: Ambulatory Visit | Attending: Obstetrics | Admitting: Obstetrics

## 2014-02-15 ENCOUNTER — Encounter (HOSPITAL_COMMUNITY): Payer: Self-pay

## 2014-02-15 DIAGNOSIS — O479 False labor, unspecified: Secondary | ICD-10-CM | POA: Insufficient documentation

## 2014-02-15 DIAGNOSIS — Z3403 Encounter for supervision of normal first pregnancy, third trimester: Secondary | ICD-10-CM

## 2014-02-15 NOTE — MAU Note (Signed)
Ctx since 630 pm tonight after going for a walk; doesn't know how frequent. Denies LOF or vaginal bleeding. Positive fetal movement.

## 2014-02-16 ENCOUNTER — Inpatient Hospital Stay (HOSPITAL_COMMUNITY)
Admission: AD | Admit: 2014-02-16 | Discharge: 2014-02-16 | Disposition: A | Discharge status: Home / Self Care | Payer: Medicaid Other | Source: Ambulatory Visit | Attending: Obstetrics | Admitting: Obstetrics

## 2014-02-16 ENCOUNTER — Encounter (HOSPITAL_COMMUNITY): Payer: Self-pay | Admitting: *Deleted

## 2014-02-16 DIAGNOSIS — O479 False labor, unspecified: Secondary | ICD-10-CM | POA: Diagnosis present

## 2014-02-16 MED ORDER — OXYCODONE-ACETAMINOPHEN 5-325 MG PO TABS
2.0000 | ORAL_TABLET | ORAL | Status: AC
  Administered 2014-02-16: 2 via ORAL
  Filled 2014-02-16: qty 2

## 2014-02-16 MED ORDER — OXYCODONE-ACETAMINOPHEN 5-325 MG PO TABS
2.0000 | ORAL_TABLET | Freq: Once | ORAL | Status: AC
  Administered 2014-02-16: 2 via ORAL
  Filled 2014-02-16: qty 2

## 2014-02-16 NOTE — Discharge Instructions (Signed)

## 2014-02-16 NOTE — Discharge Instructions (Signed)
Braxton Hicks Contractions Contractions of the uterus can occur throughout pregnancy. Contractions are not always a sign that you are in labor.  WHAT ARE BRAXTON HICKS CONTRACTIONS?  Contractions that occur before labor are called Braxton Hicks contractions, or false labor. Toward the end of pregnancy (32-34 weeks), these contractions can develop more often and may become more forceful. This is not true labor because these contractions do not result in opening (dilatation) and thinning of the cervix. They are sometimes difficult to tell apart from true labor because these contractions can be forceful and people have different pain tolerances. You should not feel embarrassed if you go to the hospital with false labor. Sometimes, the only way to tell if you are in true labor is for your health care provider to look for changes in the cervix. If there are no prenatal problems or other health problems associated with the pregnancy, it is completely safe to be sent home with false labor and await the onset of true labor. HOW CAN YOU TELL THE DIFFERENCE BETWEEN TRUE AND FALSE LABOR? False Labor  The contractions of false labor are usually shorter and not as hard as those of true labor.   The contractions are usually irregular.   The contractions are often felt in the front of the lower abdomen and in the groin.   The contractions may go away when you walk around or change positions while lying down.   The contractions get weaker and are shorter lasting as time goes on.   The contractions do not usually become progressively stronger, regular, and closer together as with true labor.  True Labor  Contractions in true labor last 30-70 seconds, become very regular, usually become more intense, and increase in frequency.   The contractions do not go away with walking.   The discomfort is usually felt in the top of the uterus and spreads to the lower abdomen and low back.   True labor can be  determined by your health care provider with an exam. This will show that the cervix is dilating and getting thinner.  WHAT TO REMEMBER  Keep up with your usual exercises and follow other instructions given by your health care provider.   Take medicines as directed by your health care provider.   Keep your regular prenatal appointments.   Eat and drink lightly if you think you are going into labor.   If Braxton Hicks contractions are making you uncomfortable:   Change your position from lying down or resting to walking, or from walking to resting.   Sit and rest in a tub of warm water.   Drink 2-3 glasses of water. Dehydration may cause these contractions.   Do slow and deep breathing several times an hour.  WHEN SHOULD I SEEK IMMEDIATE MEDICAL CARE? Seek immediate medical care if:  Your contractions become stronger, more regular, and closer together.   You have fluid leaking or gushing from your vagina.   You have a fever.   You pass blood-tinged mucus.   You have vaginal bleeding.   You have continuous abdominal pain.   You have low back pain that you never had before.   You feel your baby's head pushing down and causing pelvic pressure.   Your baby is not moving as much as it used to.  Document Released: 07/03/2005 Document Revised: 07/08/2013 Document Reviewed: 04/14/2013 [employer] Patient Information 2015 [employer]. This information is not intended to replace advice given to you by your health care   provider. Make sure you discuss any questions you have with your health care provider.  Fetal Movement Counts Patient Name: __________________________________________________ Patient Due Date: ____________________ Performing a fetal movement count is highly recommended in high-risk pregnancies, but it is good for every pregnant woman to do. Your health care provider may ask you to start counting fetal movements at 28 weeks of the pregnancy. Fetal  movements often increase:  After eating a full meal.  After physical activity.  After eating or drinking something sweet or cold.  At rest. Pay attention to when you feel the baby is most active. This will help you notice a pattern of your baby's sleep and wake cycles and what factors contribute to an increase in fetal movement. It is important to perform a fetal movement count at the same time each day when your baby is normally most active.  HOW TO COUNT FETAL MOVEMENTS 1. Find a quiet and comfortable area to sit or lie down on your left side. Lying on your left side provides the best blood and oxygen circulation to your baby. 2. Write down the day and time on a sheet of paper or in a journal. 3. Start counting kicks, flutters, swishes, rolls, or jabs in a 2-hour period. You should feel at least 10 movements within 2 hours. 4. If you do not feel 10 movements in 2 hours, wait 2-3 hours and count again. Look for a change in the pattern or not enough counts in 2 hours. SEEK MEDICAL CARE IF:  You feel less than 10 counts in 2 hours, tried twice.  There is no movement in over an hour.  The pattern is changing or taking longer each day to reach 10 counts in 2 hours.  You feel the baby is not moving as he or she usually does. Date: ____________ Movements: ____________ Start time: ____________ Finish time: ____________  Date: ____________ Movements: ____________ Start time: ____________ Finish time: ____________ Date: ____________ Movements: ____________ Start time: ____________ Finish time: ____________ Date: ____________ Movements: ____________ Start time: ____________ Finish time: ____________ Date: ____________ Movements: ____________ Start time: ____________ Finish time: ____________ Date: ____________ Movements: ____________ Start time: ____________ Finish time: ____________ Date: ____________ Movements: ____________ Start time: ____________ Finish time: ____________ Date: ____________  Movements: ____________ Start time: ____________ Finish time: ____________  Date: ____________ Movements: ____________ Start time: ____________ Finish time: ____________ Date: ____________ Movements: ____________ Start time: ____________ Finish time: ____________ Date: ____________ Movements: ____________ Start time: ____________ Finish time: ____________ Date: ____________ Movements: ____________ Start time: ____________ Finish time: ____________ Date: ____________ Movements: ____________ Start time: ____________ Finish time: ____________ Date: ____________ Movements: ____________ Start time: ____________ Finish time: ____________ Date: ____________ Movements: ____________ Start time: ____________ Finish time: ____________  Date: ____________ Movements: ____________ Start time: ____________ Finish time: ____________ Date: ____________ Movements: ____________ Start time: ____________ Finish time: ____________ Date: ____________ Movements: ____________ Start time: ____________ Finish time: ____________ Date: ____________ Movements: ____________ Start time: ____________ Finish time: ____________ Date: ____________ Movements: ____________ Start time: ____________ Finish time: ____________ Date: ____________ Movements: ____________ Start time: ____________ Finish time: ____________ Date: ____________ Movements: ____________ Start time: ____________ Finish time: ____________  Date: ____________ Movements: ____________ Start time: ____________ Finish time: ____________ Date: ____________ Movements: ____________ Start time: ____________ Finish time: ____________ Date: ____________ Movements: ____________ Start time: ____________ Finish time: ____________ Date: ____________ Movements: ____________ Start time: ____________ Finish time: ____________ Date: ____________ Movements: ____________ Start time: ____________ Finish time: ____________ Date: ____________ Movements: ____________ Start time:  ____________ Finish time: ____________ Date: ____________ Movements:   ____________ Start time: ____________ Finish time: ____________  Date: ____________ Movements: ____________ Start time: ____________ Finish time: ____________ Date: ____________ Movements: ____________ Start time: ____________ Finish time: ____________ Date: ____________ Movements: ____________ Start time: ____________ Finish time: ____________ Date: ____________ Movements: ____________ Start time: ____________ Finish time: ____________ Date: ____________ Movements: ____________ Start time: ____________ Finish time: ____________ Date: ____________ Movements: ____________ Start time: ____________ Finish time: ____________ Date: ____________ Movements: ____________ Start time: ____________ Finish time: ____________  Date: ____________ Movements: ____________ Start time: ____________ Finish time: ____________ Date: ____________ Movements: ____________ Start time: ____________ Finish time: ____________ Date: ____________ Movements: ____________ Start time: ____________ Finish time: ____________ Date: ____________ Movements: ____________ Start time: ____________ Finish time: ____________ Date: ____________ Movements: ____________ Start time: ____________ Finish time: ____________ Date: ____________ Movements: ____________ Start time: ____________ Finish time: ____________ Date: ____________ Movements: ____________ Start time: ____________ Finish time: ____________  Date: ____________ Movements: ____________ Start time: ____________ Finish time: ____________ Date: ____________ Movements: ____________ Start time: ____________ Finish time: ____________ Date: ____________ Movements: ____________ Start time: ____________ Finish time: ____________ Date: ____________ Movements: ____________ Start time: ____________ Finish time: ____________ Date: ____________ Movements: ____________ Start time: ____________ Finish time: ____________ Date:  ____________ Movements: ____________ Start time: ____________ Finish time: ____________ Date: ____________ Movements: ____________ Start time: ____________ Finish time: ____________  Date: ____________ Movements: ____________ Start time: ____________ Finish time: ____________ Date: ____________ Movements: ____________ Start time: ____________ Finish time: ____________ Date: ____________ Movements: ____________ Start time: ____________ Finish time: ____________ Date: ____________ Movements: ____________ Start time: ____________ Finish time: ____________ Date: ____________ Movements: ____________ Start time: ____________ Finish time: ____________ Date: ____________ Movements: ____________ Start time: ____________ Finish time: ____________ Document Released: 08/02/2006 Document Revised: 11/17/2013 Document Reviewed: 04/29/2012 [employer] Patient Information 2015 [employer]. This information is not intended to replace advice given to you by your health care provider. Make sure you discuss any questions you have with your health care provider.  

## 2014-02-16 NOTE — MAU Note (Signed)
Pt presented with contractions that had started earlier this morning. Pt denies bleeding and leaking of fluid.

## 2014-02-17 ENCOUNTER — Encounter: Payer: Self-pay | Admitting: Obstetrics

## 2014-02-17 ENCOUNTER — Ambulatory Visit (INDEPENDENT_AMBULATORY_CARE_PROVIDER_SITE_OTHER): Payer: Medicaid Other | Admitting: Obstetrics

## 2014-02-17 VITALS — Temp 98.0°F | Wt 224.0 lb

## 2014-02-17 DIAGNOSIS — Z3400 Encounter for supervision of normal first pregnancy, unspecified trimester: Secondary | ICD-10-CM

## 2014-02-17 DIAGNOSIS — Z3403 Encounter for supervision of normal first pregnancy, third trimester: Secondary | ICD-10-CM

## 2014-02-17 NOTE — Progress Notes (Signed)
Subjective:    Erin Jacobs is a 18 y.o. female being seen today for her obstetrical visit. She is at [redacted]w[redacted]d gestation. Patient reports contractions since yesterday. Fetal movement: normal.  Problem List Items Addressed This Visit   Supervision of normal first pregnancy - Primary   Relevant Orders      POCT urinalysis dipstick     Patient Active Problem List   Diagnosis Date Noted  . Supervision of normal first pregnancy 08/15/2013    Objective:    Temp(Src) 98 F (36.7 C)  Wt 224 lb (101.606 kg)  LMP 05/24/2013 FHT: 150 BPM  Uterine Size: size equals dates  Presentations: cephalic  Pelvic Exam:              Dilation: 1cm       Effacement: 50%             Station:  -3    Consistency: firm            Position: posterior     Assessment:    Pregnancy @ [redacted]w[redacted]d weeks   Plan:   Plans for delivery: Vaginal anticipated; labs reviewed; problem list updated Counseling: Consent signed. Infant feeding: plans to breastfeed. Cigarette smoking: never smoked. L&D discussion: symptoms of labor, discussed when to call, discussed what number to call, anesthetic/analgesic options reviewed and delivering clinician:  plans Physician. Postpartum supports and preparation: circumcision discussed and contraception plans discussed.  Follow up in 1 Week.

## 2014-02-24 ENCOUNTER — Ambulatory Visit (INDEPENDENT_AMBULATORY_CARE_PROVIDER_SITE_OTHER): Payer: Medicaid Other | Admitting: Obstetrics

## 2014-02-24 VITALS — BP 124/74 | HR 103 | Temp 97.9°F | Wt 223.0 lb

## 2014-02-24 DIAGNOSIS — Z3400 Encounter for supervision of normal first pregnancy, unspecified trimester: Secondary | ICD-10-CM

## 2014-02-24 DIAGNOSIS — Z3403 Encounter for supervision of normal first pregnancy, third trimester: Secondary | ICD-10-CM

## 2014-02-27 ENCOUNTER — Encounter: Payer: Self-pay | Admitting: Obstetrics

## 2014-02-27 NOTE — Progress Notes (Signed)
Subjective:    Erin Jacobs is a 18 y.o. female being seen today for her obstetrical visit. She is at [redacted]w[redacted]d gestation. Patient reports no complaints. Fetal movement: normal.  Problem List Items Addressed This Visit   None     Patient Active Problem List   Diagnosis Date Noted  . Supervision of normal first pregnancy 08/15/2013    Objective:    BP 124/74  Pulse 103  Temp(Src) 97.9 F (36.6 C)  Wt 223 lb (101.152 kg)  LMP 05/24/2013 FHT: 150 BPM  Uterine Size: size equals dates  Presentations: cephalic  Pelvic Exam:              Dilation: 1cm       Effacement: 50%             Station:  -3    Consistency: firm            Position: posterior     Assessment:    Pregnancy @ [redacted]w[redacted]d weeks   Plan:   Plans for delivery: Vaginal anticipated; labs reviewed; problem list updated Counseling: Consent signed. Infant feeding: plans to breastfeed. Cigarette smoking: never smoked. L&D discussion: symptoms of labor, discussed when to call, discussed what number to call, anesthetic/analgesic options reviewed and delivering clinician:  plans Physician. Postpartum supports and preparation: circumcision discussed and contraception plans discussed.  Follow up in 1 Week.

## 2014-03-04 ENCOUNTER — Ambulatory Visit (INDEPENDENT_AMBULATORY_CARE_PROVIDER_SITE_OTHER): Payer: Medicaid Other | Admitting: Obstetrics

## 2014-03-04 VITALS — BP 137/75 | HR 110 | Temp 98.2°F | Wt 225.0 lb

## 2014-03-04 DIAGNOSIS — Z3403 Encounter for supervision of normal first pregnancy, third trimester: Secondary | ICD-10-CM

## 2014-03-04 DIAGNOSIS — Z3400 Encounter for supervision of normal first pregnancy, unspecified trimester: Secondary | ICD-10-CM

## 2014-03-05 ENCOUNTER — Inpatient Hospital Stay (HOSPITAL_COMMUNITY)
Admission: AD | Admit: 2014-03-05 | Discharge: 2014-03-05 | Disposition: A | Discharge status: Home / Self Care | Payer: Medicaid Other | Source: Ambulatory Visit | Attending: Obstetrics | Admitting: Obstetrics

## 2014-03-05 ENCOUNTER — Encounter: Payer: Self-pay | Admitting: Obstetrics

## 2014-03-05 ENCOUNTER — Encounter (HOSPITAL_COMMUNITY): Payer: Self-pay | Admitting: *Deleted

## 2014-03-05 DIAGNOSIS — O9989 Other specified diseases and conditions complicating pregnancy, childbirth and the puerperium: Principal | ICD-10-CM

## 2014-03-05 DIAGNOSIS — Z3403 Encounter for supervision of normal first pregnancy, third trimester: Secondary | ICD-10-CM

## 2014-03-05 DIAGNOSIS — O99891 Other specified diseases and conditions complicating pregnancy: Secondary | ICD-10-CM | POA: Insufficient documentation

## 2014-03-05 LAB — AMNISURE RUPTURE OF MEMBRANE (ROM) NOT AT ARMC: Amnisure ROM: NEGATIVE

## 2014-03-05 NOTE — Progress Notes (Signed)
PT c/o SROM. Scant amount of fluid noted. Had patient cough with little fluid noted. Fern NEG. Will collect amnisure per MD order.

## 2014-03-05 NOTE — Progress Notes (Signed)
Subjective:    Erin Jacobs is a 18 y.o. female being seen today for her obstetrical visit. She is at [redacted]w[redacted]d gestation. Patient reports no complaints. Fetal movement: normal.  Problem List Items Addressed This Visit   Supervision of normal first pregnancy - Primary   Relevant Orders      POCT urinalysis dipstick     Patient Active Problem List   Diagnosis Date Noted  . Supervision of normal first pregnancy 08/15/2013    Objective:    BP 137/75  Pulse 110  Temp(Src) 98.2 F (36.8 C)  Wt 225 lb (102.059 kg)  LMP 05/24/2013 FHT:  150 BPM  Uterine Size: size equals dates  Presentation: cephalic  Pelvic Exam:              Dilation: 2cm       Effacement: 50%   Station:  -2     Consistency: soft            Position: middle     Assessment:    Pregnancy @ [redacted]w[redacted]d  weeks   Plan:    Postdates management: discussed fetal surveillance and induction, discussed fetal movement, NST reactive, biophysical profile ordered. Induction: scheduled for 1 week, written information given.  Follow up in 1 week.

## 2014-03-05 NOTE — Discharge Instructions (Signed)
Braxton Hicks Contractions °Contractions of the uterus can occur throughout pregnancy. Contractions are not always a sign that you are in labor.  °WHAT ARE BRAXTON HICKS CONTRACTIONS?  °Contractions that occur before labor are called Braxton Hicks contractions, or false labor. Toward the end of pregnancy (32-34 weeks), these contractions can develop more often and may become more forceful. This is not true labor because these contractions do not result in opening (dilatation) and thinning of the cervix. They are sometimes difficult to tell apart from true labor because these contractions can be forceful and people have different pain tolerances. You should not feel embarrassed if you go to the hospital with false labor. Sometimes, the only way to tell if you are in true labor is for your health care provider to look for changes in the cervix. °If there are no prenatal problems or other health problems associated with the pregnancy, it is completely safe to be sent home with false labor and await the onset of true labor. °HOW CAN YOU TELL THE DIFFERENCE BETWEEN TRUE AND FALSE LABOR? °False Labor °· The contractions of false labor are usually shorter and not as hard as those of true labor.   °· The contractions are usually irregular.   °· The contractions are often felt in the front of the lower abdomen and in the groin.   °· The contractions may go away when you walk around or change positions while lying down.   °· The contractions get weaker and are shorter lasting as time goes on.   °· The contractions do not usually become progressively stronger, regular, and closer together as with true labor.   °True Labor °· Contractions in true labor last 30-70 seconds, become very regular, usually become more intense, and increase in frequency.   °· The contractions do not go away with walking.   °· The discomfort is usually felt in the top of the uterus and spreads to the lower abdomen and low back.   °· True labor can be  determined by your health care provider with an exam. This will show that the cervix is dilating and getting thinner.   °WHAT TO REMEMBER °· Keep up with your usual exercises and follow other instructions given by your health care provider.   °· Take medicines as directed by your health care provider.   °· Keep your regular prenatal appointments.   °· Eat and drink lightly if you think you are going into labor.   °· If Braxton Hicks contractions are making you uncomfortable:   °¨ Change your position from lying down or resting to walking, or from walking to resting.   °¨ Sit and rest in a tub of warm water.   °¨ Drink 2-3 glasses of water. Dehydration may cause these contractions.   °¨ Do slow and deep breathing several times an hour.   °WHEN SHOULD I SEEK IMMEDIATE MEDICAL CARE? °Seek immediate medical care if: °· Your contractions become stronger, more regular, and closer together.   °· You have fluid leaking or gushing from your vagina.   °· You have a fever.   °· You pass blood-tinged mucus.   °· You have vaginal bleeding.   °· You have continuous abdominal pain.   °· You have low back pain that you never had before.   °· You feel your baby's head pushing down and causing pelvic pressure.   °· Your baby is not moving as much as it used to.   °Document Released: 07/03/2005 Document Revised: 07/08/2013 Document Reviewed: 04/14/2013 °[employer]® Patient Information ©2015 [employer]. This information is not intended to replace advice given to you by your health care   provider. Make sure you discuss any questions you have with your health care provider. ° °

## 2014-03-06 ENCOUNTER — Encounter (HOSPITAL_COMMUNITY): Payer: Self-pay | Admitting: *Deleted

## 2014-03-06 ENCOUNTER — Telehealth (HOSPITAL_COMMUNITY): Payer: Self-pay | Admitting: *Deleted

## 2014-03-06 NOTE — Telephone Encounter (Signed)
Preadmission screen  

## 2014-03-09 ENCOUNTER — Encounter: Payer: Medicaid Other | Admitting: Obstetrics

## 2014-03-09 ENCOUNTER — Encounter (HOSPITAL_COMMUNITY): Payer: Self-pay

## 2014-03-09 ENCOUNTER — Inpatient Hospital Stay (HOSPITAL_COMMUNITY)
Admission: RE | Admit: 2014-03-09 | Discharge: 2014-03-09 | Disposition: A | Discharge status: Home / Self Care | Payer: Medicaid Other | Source: Ambulatory Visit | Attending: Obstetrics | Admitting: Obstetrics

## 2014-03-09 ENCOUNTER — Inpatient Hospital Stay (HOSPITAL_COMMUNITY)
Admission: AD | Admit: 2014-03-09 | Discharge: 2014-03-12 | DRG: 775 | Disposition: A | Discharge status: Home / Self Care | Payer: Medicaid Other | Source: Ambulatory Visit | Attending: Obstetrics | Admitting: Obstetrics

## 2014-03-09 DIAGNOSIS — D509 Iron deficiency anemia, unspecified: Secondary | ICD-10-CM | POA: Diagnosis present

## 2014-03-09 DIAGNOSIS — O9902 Anemia complicating childbirth: Secondary | ICD-10-CM | POA: Diagnosis present

## 2014-03-09 DIAGNOSIS — O480 Post-term pregnancy: Secondary | ICD-10-CM | POA: Diagnosis present

## 2014-03-09 DIAGNOSIS — O9081 Anemia of the puerperium: Secondary | ICD-10-CM | POA: Diagnosis not present

## 2014-03-09 LAB — CBC
HEMATOCRIT: 31.6 % — AB (ref 36.0–46.0)
Hemoglobin: 10 g/dL — ABNORMAL LOW (ref 12.0–15.0)
MCH: 24.2 pg — ABNORMAL LOW (ref 26.0–34.0)
MCHC: 31.6 g/dL (ref 30.0–36.0)
MCV: 76.5 fL — ABNORMAL LOW (ref 78.0–100.0)
PLATELETS: 311 10*3/uL (ref 150–400)
RBC: 4.13 MIL/uL (ref 3.87–5.11)
RDW: 16.4 % — ABNORMAL HIGH (ref 11.5–15.5)
WBC: 9.5 10*3/uL (ref 4.0–10.5)

## 2014-03-09 LAB — TYPE AND SCREEN
ABO/RH(D): B POS
ANTIBODY SCREEN: NEGATIVE

## 2014-03-09 MED ORDER — OXYTOCIN 40 UNITS IN LACTATED RINGERS INFUSION - SIMPLE MED
62.5000 mL/h | INTRAVENOUS | Status: DC
  Administered 2014-03-10: 62.5 mL/h via INTRAVENOUS

## 2014-03-09 MED ORDER — ACETAMINOPHEN 325 MG PO TABS: 650.0000 mg | ORAL_TABLET | ORAL | Status: DC | PRN

## 2014-03-09 MED ORDER — CITRIC ACID-SODIUM CITRATE 334-500 MG/5ML PO SOLN: 30.0000 mL | ORAL | Status: DC | PRN

## 2014-03-09 MED ORDER — LACTATED RINGERS IV SOLN: 500.0000 mL | INTRAVENOUS | Status: DC | PRN

## 2014-03-09 MED ORDER — ONDANSETRON HCL 4 MG/2ML IJ SOLN: 4.0000 mg | Freq: Four times a day (QID) | INTRAMUSCULAR | Status: DC | PRN

## 2014-03-09 MED ORDER — ZOLPIDEM TARTRATE 5 MG PO TABS
5.0000 mg | ORAL_TABLET | Freq: Every evening | ORAL | Status: DC | PRN
  Administered 2014-03-10: 5 mg via ORAL
  Filled 2014-03-09: qty 1

## 2014-03-09 MED ORDER — BUTORPHANOL TARTRATE 1 MG/ML IJ SOLN
1.0000 mg | INTRAMUSCULAR | Status: DC | PRN
  Administered 2014-03-09 – 2014-03-10 (×4): 1 mg via INTRAVENOUS
  Filled 2014-03-09 (×4): qty 1

## 2014-03-09 MED ORDER — LIDOCAINE HCL (PF) 1 % IJ SOLN
30.0000 mL | INTRAMUSCULAR | Status: DC | PRN
  Filled 2014-03-09: qty 30

## 2014-03-09 MED ORDER — OXYCODONE-ACETAMINOPHEN 5-325 MG PO TABS: 1.0000 | ORAL_TABLET | ORAL | Status: DC | PRN

## 2014-03-09 MED ORDER — OXYTOCIN BOLUS FROM INFUSION: 500.0000 mL | INTRAVENOUS | Status: DC

## 2014-03-09 MED ORDER — IBUPROFEN 600 MG PO TABS: 600.0000 mg | ORAL_TABLET | Freq: Four times a day (QID) | ORAL | Status: DC | PRN

## 2014-03-09 MED ORDER — OXYTOCIN 40 UNITS IN LACTATED RINGERS INFUSION - SIMPLE MED
1.0000 m[IU]/min | INTRAVENOUS | Status: DC
  Administered 2014-03-09: 1 m[IU]/min via INTRAVENOUS
  Filled 2014-03-09: qty 1000

## 2014-03-09 MED ORDER — LACTATED RINGERS IV SOLN
INTRAVENOUS | Status: DC
  Administered 2014-03-09 – 2014-03-10 (×2): via INTRAVENOUS

## 2014-03-09 MED ORDER — TERBUTALINE SULFATE 1 MG/ML IJ SOLN: 0.2500 mg | Freq: Once | INTRAMUSCULAR | Status: AC | PRN

## 2014-03-10 ENCOUNTER — Encounter (HOSPITAL_COMMUNITY): Payer: Medicaid Other | Admitting: Anesthesiology

## 2014-03-10 ENCOUNTER — Inpatient Hospital Stay (HOSPITAL_COMMUNITY): Payer: Medicaid Other | Admitting: Anesthesiology

## 2014-03-10 ENCOUNTER — Encounter (HOSPITAL_COMMUNITY): Payer: Self-pay | Admitting: *Deleted

## 2014-03-10 LAB — RPR

## 2014-03-10 MED ORDER — EPHEDRINE 5 MG/ML INJ
10.0000 mg | INTRAVENOUS | Status: DC | PRN
  Filled 2014-03-10: qty 2

## 2014-03-10 MED ORDER — FENTANYL 2.5 MCG/ML BUPIVACAINE 1/10 % EPIDURAL INFUSION (WH - ANES)
14.0000 mL/h | INTRAMUSCULAR | Status: DC | PRN
  Administered 2014-03-10 (×2): 14 mL/h via EPIDURAL
  Filled 2014-03-10 (×2): qty 125

## 2014-03-10 MED ORDER — EPHEDRINE 5 MG/ML INJ
10.0000 mg | INTRAVENOUS | Status: DC | PRN
  Filled 2014-03-10: qty 4
  Filled 2014-03-10: qty 2

## 2014-03-10 MED ORDER — PHENYLEPHRINE 40 MCG/ML (10ML) SYRINGE FOR IV PUSH (FOR BLOOD PRESSURE SUPPORT)
80.0000 ug | PREFILLED_SYRINGE | INTRAVENOUS | Status: DC | PRN
  Filled 2014-03-10: qty 10
  Filled 2014-03-10: qty 2

## 2014-03-10 MED ORDER — LACTATED RINGERS IV SOLN
INTRAVENOUS | Status: DC
Start: 2014-03-10 — End: 2014-03-11

## 2014-03-10 MED ORDER — LIDOCAINE HCL (PF) 1 % IJ SOLN
INTRAMUSCULAR | Status: DC | PRN
  Administered 2014-03-10 (×4): 4 mL

## 2014-03-10 MED ORDER — OXYTOCIN 40 UNITS IN LACTATED RINGERS INFUSION - SIMPLE MED: 1.0000 m[IU]/min | INTRAVENOUS | Status: DC

## 2014-03-10 MED ORDER — LACTATED RINGERS IV SOLN: 500.0000 mL | Freq: Once | INTRAVENOUS | Status: DC

## 2014-03-10 MED ORDER — PHENYLEPHRINE 40 MCG/ML (10ML) SYRINGE FOR IV PUSH (FOR BLOOD PRESSURE SUPPORT)
80.0000 ug | PREFILLED_SYRINGE | INTRAVENOUS | Status: DC | PRN
  Filled 2014-03-10: qty 2

## 2014-03-10 MED ORDER — DIPHENHYDRAMINE HCL 50 MG/ML IJ SOLN: 12.5000 mg | INTRAMUSCULAR | Status: DC | PRN

## 2014-03-10 NOTE — Anesthesia Procedure Notes (Signed)
Epidural Patient location during procedure: OB Start time: 03/10/2014 9:57 AM  Staffing Performed by: anesthesiologist   Preanesthetic Checklist Completed: patient identified, site marked, surgical consent, pre-op evaluation, timeout performed, IV checked, risks and benefits discussed and monitors and equipment checked  Epidural Patient position: sitting Prep: site prepped and draped and DuraPrep Patient monitoring: continuous pulse ox and blood pressure Approach: midline Injection technique: LOR air  Needle:  Needle type: Tuohy  Needle gauge: 17 G Needle length: 9 cm and 9 Needle insertion depth: 7 cm Catheter type: closed end flexible Catheter size: 19 Gauge Catheter at skin depth: 12 cm Test dose: negative  Assessment Events: blood not aspirated, injection not painful, no injection resistance, negative IV test and no paresthesia  Additional Notes Discussed risk of headache, infection, bleeding, nerve injury and failed or incomplete block.  Patient voices understanding and wishes to proceed.  Epidural placed easily on first attempt.  No paresthesia.  Patient tolerated procedure well with no apparent complications.  A> Maceo Hernan, MDReason for block:procedure for pain

## 2014-03-10 NOTE — Anesthesia Preprocedure Evaluation (Signed)
Anesthesia Evaluation  Patient identified by MRN, date of birth, ID band Patient awake    Reviewed: Allergy & Precautions, H&P , NPO status , Patient's Chart, lab work & pertinent test results, reviewed documented beta blocker date and time   History of Anesthesia Complications Negative for: history of anesthetic complications  Airway Mallampati: III TM Distance: >3 FB Neck ROM: full    Dental  (+) Teeth Intact   Pulmonary neg pulmonary ROS,  breath sounds clear to auscultation        Cardiovascular negative cardio ROS  Rhythm:regular Rate:Normal     Neuro/Psych negative neurological ROS  negative psych ROS   GI/Hepatic negative GI ROS, Neg liver ROS,   Endo/Other  BMI 38  Renal/GU negative Renal ROS     Musculoskeletal   Abdominal   Peds  Hematology  (+) anemia ,   Anesthesia Other Findings   Reproductive/Obstetrics (+) Pregnancy                           Anesthesia Physical Anesthesia Plan  ASA: II  Anesthesia Plan: Epidural   Post-op Pain Management:    Induction:   Airway Management Planned:   Additional Equipment:   Intra-op Plan:   Post-operative Plan:   Informed Consent: I have reviewed the patients History and Physical, chart, labs and discussed the procedure including the risks, benefits and alternatives for the proposed anesthesia with the patient or authorized representative who has indicated his/her understanding and acceptance.     Plan Discussed with:   Anesthesia Plan Comments:         Anesthesia Quick Evaluation

## 2014-03-10 NOTE — Progress Notes (Signed)
MARYKATHLEEN RUSSI is a 18 y.o. G2P0010 at [redacted]w[redacted]d by LMP admitted for induction of labor due to Post dates. Due date 03-04-14.  Subjective:   Objective: BP 123/66  Pulse 102  Temp(Src) 98.5 F (36.9 C) (Oral)  Resp 16  Ht 5\' 6"  (1.676 m)  Wt 235 lb (106.595 kg)  BMI 37.95 kg/m2  SpO2 97%  LMP 05/24/2013      FHT:  FHR: 140 bpm, variability: moderate,  accelerations:  Present,  decelerations:  Absent UC:   regular, every 2-4 minutes SVE:   Dilation: 5.5 Effacement (%): 100 Station: +1 Exam by:: lee  Labs: Lab Results  Component Value Date   WBC 9.5 03/09/2014   HGB 10.0* 03/09/2014   HCT 31.6* 03/09/2014   MCV 76.5* 03/09/2014   PLT 311 03/09/2014    Assessment / Plan: 40.5 weeks.  2 stage IOL.  Increase pitocin prn.  Labor: Slow active phase.  Increase pitocin prn. Preeclampsia:  n/a Fetal Wellbeing:  Category I Pain Control:  Epidural I/D:  n/a Anticipated MOD:  NSVD  HARPER,CHARLES A 03/10/2014, 7:30 PM

## 2014-03-10 NOTE — H&P (Signed)
Erin Jacobs is a 18 y.o. female presenting for IOL for postdates. Maternal Medical History:  Fetal activity: Perceived fetal activity is normal.   Last perceived fetal movement was within the past hour.    Prenatal complications: no prenatal complications Prenatal Complications - Diabetes: none.    OB History   Grav Para Term Preterm Abortions TAB SAB Ect Mult Living   2 0 0 0 1 0 1 0 0 0      Past Medical History  Diagnosis Date  . Medical history non-contributory    Past Surgical History  Procedure Laterality Date  . No past surgeries     Family History: family history is not on file. Social History:  reports that she has never smoked. She has never used smokeless tobacco. She reports that she does not drink alcohol or use illicit drugs.   Prenatal Transfer Tool  Maternal Diabetes: No Genetic Screening: Normal Maternal Ultrasounds/Referrals: Normal Fetal Ultrasounds or other Referrals:  None Maternal Substance Abuse:  No Significant Maternal Medications:  None Significant Maternal Lab Results:  None Other Comments:  None  Review of Systems  All other systems reviewed and are negative.   Dilation: 2.5 Effacement (%): 90 Station: -2 Exam by:: h stone rnc Blood pressure 123/66, pulse 102, temperature 99.1 F (37.3 C), temperature source Oral, resp. rate 20, height 5\' 6"  (1.676 m), weight 235 lb (106.595 kg), last menstrual period 05/24/2013. Maternal Exam:  Abdomen: Patient reports no abdominal tenderness. Fetal presentation: vertex  Cervix: Cervix evaluated by digital exam.     Physical Exam  Nursing note and vitals reviewed. Constitutional: She is oriented to person, place, and time. She appears well-developed and well-nourished.  HENT:  Head: Normocephalic and atraumatic.  Eyes: Conjunctivae are normal. Pupils are equal, round, and reactive to light.  Neck: Normal range of motion. Neck supple.  Cardiovascular: Normal rate and regular rhythm.    Respiratory: Effort normal.  GI: Soft.  Genitourinary: Vagina normal and uterus normal.  Musculoskeletal: Normal range of motion.  Neurological: She is alert and oriented to person, place, and time.  Skin: Skin is warm and dry.  Psychiatric: She has a normal mood and affect. Her behavior is normal. Judgment and thought content normal.    Prenatal labs: ABO, Rh: --/--/B POS (08/24 2110) Antibody: NEG (08/24 2110) Rubella: 14.00 (01/30 1138) RPR: NON REAC (08/24 2110)  HBsAg: NEGATIVE (01/30 1138)  HIV: NONREACTIVE (05/19 1145)  GBS: NOT DETECTED (07/14 1653)   Assessment/Plan: Postdates.  2 stage IOL.   Erin Jacobs A 03/10/2014, 5:35 AM

## 2014-03-10 NOTE — Progress Notes (Signed)
Erin Jacobs is a 18 y.o. G2P0010 at [redacted]w[redacted]d by LMP admitted for induction of labor due to Post dates. Due date 03-04-14.  Subjective:   Objective: BP 126/56  Pulse 103  Temp(Src) 98.5 F (36.9 C) (Oral)  Resp 16  Ht 5\' 6"  (1.676 m)  Wt 235 lb (106.595 kg)  BMI 37.95 kg/m2  SpO2 97%  LMP 05/24/2013      FHT:  FHR: 145 bpm, variability: moderate,  accelerations:  Present,  decelerations:  Present variable UC:   regular, every 2-3 minutes SVE:   Dilation: 10 Effacement (%): 100 Station: +3 Exam by:: JDaley  Labs: Lab Results  Component Value Date   WBC 9.5 03/09/2014   HGB 10.0* 03/09/2014   HCT 31.6* 03/09/2014   MCV 76.5* 03/09/2014   PLT 311 03/09/2014    Assessment / Plan: Induction of labor due to postterm,  progressing well on pitocin  Labor: Progressing normally Preeclampsia:  n/a Fetal Wellbeing:  Category I Pain Control:  Epidural I/D:  n/a Anticipated MOD:  NSVD  HARPER,CHARLES A 03/10/2014, 9:36 PM

## 2014-03-10 NOTE — Progress Notes (Signed)
ATARA PATERSON is a 18 y.o. G2P0010 at [redacted]w[redacted]d by LMP admitted for induction of labor due to Post dates. Due date 03-04-14.  Subjective:   Objective: BP 114/64  Pulse 116  Temp(Src) 98.8 F (37.1 C) (Oral)  Resp 18  Ht 5\' 6"  (1.676 m)  Wt 235 lb (106.595 kg)  BMI 37.95 kg/m2  SpO2 97%  LMP 05/24/2013      FHT:  FHR: 145 bpm, variability: moderate,  accelerations:  Present,  decelerations:  Absent UC:   regular, every 3-5 minutes SVE:   Dilation: 4 Effacement (%): 100 Station: 0 Exam by:: lee  Labs: Lab Results  Component Value Date   WBC 9.5 03/09/2014   HGB 10.0* 03/09/2014   HCT 31.6* 03/09/2014   MCV 76.5* 03/09/2014   PLT 311 03/09/2014    Assessment / Plan: Induction of labor due to postterm,  progressing well on pitocin  Labor: Progressing normally Preeclampsia:  n/a Fetal Wellbeing:  Category I Pain Control:  Epidural I/D:  n/a Anticipated MOD:  NSVD  HARPER,CHARLES A 03/10/2014, 10:47 AM

## 2014-03-11 LAB — CBC
HCT: 27.5 % — ABNORMAL LOW (ref 36.0–46.0)
HEMOGLOBIN: 8.5 g/dL — AB (ref 12.0–15.0)
MCH: 23.7 pg — AB (ref 26.0–34.0)
MCHC: 30.9 g/dL (ref 30.0–36.0)
MCV: 76.6 fL — ABNORMAL LOW (ref 78.0–100.0)
Platelets: 287 10*3/uL (ref 150–400)
RBC: 3.59 MIL/uL — ABNORMAL LOW (ref 3.87–5.11)
RDW: 16.7 % — AB (ref 11.5–15.5)
WBC: 12 10*3/uL — ABNORMAL HIGH (ref 4.0–10.5)

## 2014-03-11 MED ORDER — OXYTOCIN 40 UNITS IN LACTATED RINGERS INFUSION - SIMPLE MED: 62.5000 mL/h | INTRAVENOUS | Status: DC | PRN

## 2014-03-11 MED ORDER — ZOLPIDEM TARTRATE 5 MG PO TABS: 5.0000 mg | ORAL_TABLET | Freq: Every evening | ORAL | Status: DC | PRN

## 2014-03-11 MED ORDER — LANOLIN HYDROUS EX OINT: TOPICAL_OINTMENT | CUTANEOUS | Status: DC | PRN

## 2014-03-11 MED ORDER — DIBUCAINE 1 % RE OINT: 1.0000 "application " | TOPICAL_OINTMENT | RECTAL | Status: DC | PRN

## 2014-03-11 MED ORDER — SENNOSIDES-DOCUSATE SODIUM 8.6-50 MG PO TABS
2.0000 | ORAL_TABLET | ORAL | Status: DC
  Administered 2014-03-11 (×2): 2 via ORAL
  Filled 2014-03-11: qty 2
  Filled 2014-03-11: qty 1

## 2014-03-11 MED ORDER — IBUPROFEN 600 MG PO TABS
600.0000 mg | ORAL_TABLET | Freq: Four times a day (QID) | ORAL | Status: DC
  Administered 2014-03-11 – 2014-03-12 (×6): 600 mg via ORAL
  Filled 2014-03-11 (×6): qty 1

## 2014-03-11 MED ORDER — TETANUS-DIPHTH-ACELL PERTUSSIS 5-2.5-18.5 LF-MCG/0.5 IM SUSP
0.5000 mL | Freq: Once | INTRAMUSCULAR | Status: AC
  Administered 2014-03-12: 0.5 mL via INTRAMUSCULAR
  Filled 2014-03-11: qty 0.5

## 2014-03-11 MED ORDER — WITCH HAZEL-GLYCERIN EX PADS: 1.0000 "application " | MEDICATED_PAD | CUTANEOUS | Status: DC | PRN

## 2014-03-11 MED ORDER — ONDANSETRON HCL 4 MG PO TABS: 4.0000 mg | ORAL_TABLET | ORAL | Status: DC | PRN

## 2014-03-11 MED ORDER — OXYCODONE-ACETAMINOPHEN 5-325 MG PO TABS
1.0000 | ORAL_TABLET | ORAL | Status: DC | PRN
  Administered 2014-03-11: 1 via ORAL
  Filled 2014-03-11: qty 1

## 2014-03-11 MED ORDER — BENZOCAINE-MENTHOL 20-0.5 % EX AERO: 1.0000 "application " | INHALATION_SPRAY | CUTANEOUS | Status: DC | PRN

## 2014-03-11 MED ORDER — ONDANSETRON HCL 4 MG/2ML IJ SOLN: 4.0000 mg | INTRAMUSCULAR | Status: DC | PRN

## 2014-03-11 MED ORDER — DIPHENHYDRAMINE HCL 25 MG PO CAPS: 25.0000 mg | ORAL_CAPSULE | Freq: Four times a day (QID) | ORAL | Status: DC | PRN

## 2014-03-11 MED ORDER — PRENATAL MULTIVITAMIN CH
1.0000 | ORAL_TABLET | Freq: Every day | ORAL | Status: DC
  Administered 2014-03-11 – 2014-03-12 (×2): 1 via ORAL
  Filled 2014-03-11 (×2): qty 1

## 2014-03-11 MED ORDER — SIMETHICONE 80 MG PO CHEW: 80.0000 mg | CHEWABLE_TABLET | ORAL | Status: DC | PRN

## 2014-03-11 NOTE — Progress Notes (Signed)
Ur chart review completed.  

## 2014-03-11 NOTE — Progress Notes (Signed)
Post Partum Day 1 Subjective: no complaints  Objective: Blood pressure 117/61, pulse 104, temperature 99.1 F (37.3 C), temperature source Oral, resp. rate 20, height 5\' 6"  (1.676 m), weight 235 lb (106.595 kg), last menstrual period 05/24/2013, SpO2 97.00%, unknown if currently breastfeeding.  Physical Exam:  General: alert and no distress Lochia: appropriate Uterine Fundus: firm Incision: none DVT Evaluation: No evidence of DVT seen on physical exam.   Recent Labs  03/09/14 2110 03/11/14 0615  HGB 10.0* 8.5*  HCT 31.6* 27.5*    Assessment/Plan: Anemia.  Chronic iron deficiency.  Clinically stable.  Iron Rx. Plan for discharge tomorrow   LOS: 2 days   HARPER,CHARLES A 03/11/2014, 8:49 AM

## 2014-03-11 NOTE — Anesthesia Postprocedure Evaluation (Signed)
  Anesthesia Post-op Note  Patient: Erin Jacobs  Procedure(s) Performed: * No procedures listed *  Patient Location: Mother/Baby  Anesthesia Type:Epidural  Level of Consciousness: awake  Airway and Oxygen Therapy: Patient Spontanous Breathing  Post-op Pain: none  Post-op Assessment: Post-op Vital signs reviewed, Patient's Cardiovascular Status Stable, Respiratory Function Stable, Patent Airway, No signs of Nausea or vomiting, Adequate PO intake, Pain level controlled, No headache, No backache, No residual numbness and No residual motor weakness  Post-op Vital Signs: Reviewed and stable  Last Vitals:  Filed Vitals:   03/11/14 0540  BP: 117/61  Pulse: 104  Temp: 37.3 C  Resp: 20    Complications: No apparent anesthesia complications

## 2014-03-12 ENCOUNTER — Encounter: Payer: Medicaid Other | Admitting: Obstetrics

## 2014-03-12 DIAGNOSIS — O9081 Anemia of the puerperium: Secondary | ICD-10-CM | POA: Diagnosis not present

## 2014-03-12 MED ORDER — FERROUS SULFATE 325 (65 FE) MG PO TABS: 325.0000 mg | ORAL_TABLET | Freq: Two times a day (BID) | ORAL | Status: DC

## 2014-03-12 NOTE — Discharge Summary (Signed)
  Obstetric Discharge Summary Reason for Admission: induction of labor Prenatal Procedures: none Intrapartum Procedures: spontaneous vaginal delivery Postpartum Procedures: none Complications-Operative and Postpartum: none  Hemoglobin  Date Value Ref Range Status  03/11/2014 8.5* 12.0 - 15.0 g/dL Final     HCT  Date Value Ref Range Status  03/11/2014 27.5* 36.0 - 46.0 % Final    Physical Exam:  General: alert Lochia: appropriate Uterine: firm Incision: n/a DVT Evaluation: No evidence of DVT seen on physical exam.  Discharge Diagnoses: Active Problems:   Post-dates pregnancy   Normal delivery   Discharge Information: Date: 03/12/2014 Activity: pelvic rest Diet: routine Medications:  Prior to Admission medications   Medication Sig Start Date End Date Taking? Authorizing Provider  Prenat Vit-FePoly-Methylfol-FA (SELECT-OB) 29-0.6-0.4 MG CHEW Chew 1 tablet by mouth daily. 10/07/13  Yes Amy Thereasa Parkin, CNM  ferrous sulfate 325 (65 FE) MG tablet Take 1 tablet (325 mg total) by mouth 2 (two) times daily before a meal. 03/12/14   Lahoma Crocker, MD    Condition: stable Instructions: refer to routine discharge instructions Discharge to: home Follow-up Information   Follow up with HARPER,CHARLES A, MD. Schedule an appointment as soon as possible for a visit in 2 weeks. (Call to schedule circumcision)    Specialty:  Obstetrics and Gynecology   Contact information:   [place] [address] [phone]       Newborn Data:  Live born female  Birth Weight: 6 lb 6.8 oz (2914 g) APGAR: 8, 9   Home with mother.  Jacobs,Erin Bensch A 03/12/2014, 9:05 AM

## 2014-03-12 NOTE — Discharge Instructions (Signed)
Postpartum Care After Vaginal Delivery After you deliver your baby, you will stay in the hospital for 24 to 72 hours, unless there were problems with the labor or delivery, or you have medical problems. While you are in the hospital, you will receive help and instructions on how to care for yourself and your baby. Your doctor will order pain medicine, in case you need it. You will have a small amount of bleeding from your vagina and should change your sanitary pad frequently. Wash your hands thoroughly with soap and water for at least 20 seconds after changing pads and using the toilet. Let the nurses know if you begin to pass blood clots or your bleeding increases. Do not flush blood clots down the toilet before having the nurse look at them, to make sure there is no placental tissue with them. If you had an intravenous, it will be removed within 24 hours, if there are no problems. The first time you get out of bed or take a shower, call the nurse to help you because you may get weak, lightheaded, or even faint. If you are breastfeeding, you may feel painful contractions of your uterus for a couple of weeks. This is normal. The contractions help your uterus get back to normal size. If you are not breastfeeding, wear a tight, binding bra and decrease your fluid intake. You may be given a medicine to dry up the milk in your breasts. Hormones should not be given to dry up the breasts, because they can cause blood clots. You will be given your normal diet, unless you have diabetes or other medical problems.  The nurses may put an ice pack on your episiotomy (surgically enlarged opening), if you have one, to reduce the pain and puffiness (swelling). On rare occasions, you may not be able to urinate and the nurse will need to empty your bladder with a catheter. If you had a postpartum tubal ligation ("tying tubes," female sterilization), it should not make your stay in the hospital longer. You may have your baby in  your room with you as much as you like, unless you or the baby has a problem. Use the bassinet (basket) for the baby when going to and from the nursery. Do not carry the baby. Do not leave the postpartum area. If the mother is Rh negative (lacks a protein on the red blood cells) and the baby is Rh positive, the mother should get a Rho-gam shot to prevent Rh problems with future pregnancies. You may be given written instructions for you and your baby, and necessary medicines, when you are discharged from the hospital. Be sure you understand and follow the instructions as advised. HOME CARE INSTRUCTIONS AFTER YOUR DELIVERY:  Follow instructions and take the medicines given to you.   Only take over-the-counter or prescription medicines for pain, discomfort, or fever as directed by your caregiver.   Do not take aspirin, because it can cause bleeding.   Increase your activities a little bit every day to build up your strength and endurance.   Do not drink alcohol, especially if you are breastfeeding or taking pain medicine.   Take your temperature twice a day and record it.   You may have a small amount of bleeding or spotting for 2 to 4 weeks. This is normal.   Do not use tampons or douche. Use sanitary pads.   Try to have someone stay and help you for a few days when you go home.     Try to rest or take a nap when the baby is sleeping.   If you are breastfeeding, wear a good support bra. If you are not breastfeeding, wear a tight bra, do not stimulate your nipples, and decrease your fluid intake.   Eat a healthy, nutritious diet and continue to take your prenatal vitamins.   Do not drive, do any heavy activities or travel until your caregiver tells you it is OK.   Do not have intercourse until your caregiver gives you permission to do so.   Ask your caregiver when you can begin to exercise and what type of exercises to do.   Call your caregiver if you think you are having a problem from  your delivery.   Call your pediatrician if you are having a problem with the baby.   Schedule your postpartum visit and keep it.  SEEK MEDICAL CARE IF:  You have a temperature of 100 F (37.8 C) or higher.   You have increased vaginal bleeding or are passing clots. Save any clots to show your caregiver.   You have bloody urine, or pain when you urinate.   You have a bad smelling vaginal discharge.   You have increasing pain or swelling on your episiotomy (surgically enlarged opening).   You develop a severe headache.   You feel depressed.   The episiotomy is separating.   You become dizzy or lightheaded.   You develop a rash.   You have a reaction or problems with your medicine.   You have pain, redness, and/or swelling at the intravenous site.  SEEK IMMEDIATE MEDICAL CARE IF:  You have chest pain.   You develop shortness of breath.   You pass out.   You develop pain, with or without swelling or redness in your leg.   You develop heavy vaginal bleeding, with or without blood clots.   You develop stomach pain.   You develop a bad smelling vaginal discharge.  MAKE SURE YOU:   Understand these instructions.   Will watch your condition.   Will get help right away if you are not doing well or get worse.  Document Released: 04/30/2007 Document Re-Released: 06/21/2009 [employer] Patient Information 2011 [place]. Iron Deficiency Anemia There are many types of anemia. Iron deficiency anemia is the most common. Iron deficiency anemia is a decrease in the number of red blood cells caused by too little iron. Without enough iron, your body does not produce enough hemoglobin. Hemoglobin is a substance in red blood cells that carries oxygen to the body's tissues. Iron deficiency anemia may leave you tired and short of breath. CAUSES   Lack of iron in the diet.   This may be seen in infants and children, because there is little iron in milk.   This may be seen in  adults who do not eat enough iron-rich foods.   This may be seen in pregnant or breastfeeding women who do not take iron supplements. There is a much higher need for iron intake at these times.   Poor absorption of iron, as seen with intestinal disorders.   Intestinal bleeding.   Heavy periods.  SYMPTOMS  Mild anemia may not be noticeable. Symptoms may include:  Fatigue.   Headache.   Pale skin.   Weakness.   Shortness of breath.   Dizziness.   Cold hands and feet.   Fast or irregular heartbeat.  DIAGNOSIS  Diagnosis requires a thorough evaluation and physical exam by your caregiver.  Blood tests  are generally used to confirm iron deficiency anemia.   Additional tests may be done to find the underlying cause of your anemia. These may include:   Testing for blood in the stool (fecal occult blood test).   A procedure to see inside the colon and rectum (colonoscopy).   A procedure to see inside the esophagus and stomach (endoscopy).  TREATMENT   Correcting the cause of the iron deficiency is the first step.   Medicines, such as oral contraceptives, can make heavy menstrual flows lighter.   Antibiotics and other medicines can be used to treat peptic ulcers.   Surgery may be needed to remove a bleeding polyp, tumor, or fibroid.   Often, iron supplements (ferrous sulfate) are taken.   For the best iron absorption, take these supplements with an empty stomach.   You may need to take the supplements with food if you cannot tolerate them on an empty stomach. Vitamin C improves the absorption of iron. Your caregiver may recommend taking your iron tablets with a glass of orange juice or vitamin C supplement.   Milk and antacids should not be taken at the same time as iron supplements. They may interfere with the absorption of iron.   Iron supplements can cause constipation. A stool softener is often recommended.   Pregnant and breastfeeding women will need to take  extra iron, because their normal diet usually will not provide the required amount.   Patients who cannot tolerate iron by mouth can take it through a vein (intravenously) or by an injection into the muscle.  HOME CARE INSTRUCTIONS   Ask your dietitian for help with diet questions.   Take iron and vitamins as directed by your caregiver.   Eat a diet rich in iron. Eat liver, lean beef, whole-grain bread, eggs, dried fruit, and dark green leafy vegetables.  SEEK IMMEDIATE MEDICAL CARE IF:   You have a fainting episode. Do not drive yourself. Call your local emergency services (911 in [place] if no other help is available.   You have chest pain, nausea, or vomiting.   You develop severe or increased shortness of breath with activities.   You develop weakness or increased thirst.   You have a rapid heartbeat.   You develop unexplained sweating or become lightheaded when getting up from a chair or bed.  MAKE SURE YOU:   Understand these instructions.   Will watch your condition.   Will get help right away if you are not doing well or get worse.  Document Released: 06/30/2000 Document Revised: 03/15/2011 Document Reviewed: 11/09/2009 [facility] Patient Information 2012 [place].

## 2014-04-28 ENCOUNTER — Ambulatory Visit: Payer: Medicaid Other | Admitting: Obstetrics

## 2014-05-01 ENCOUNTER — Ambulatory Visit (INDEPENDENT_AMBULATORY_CARE_PROVIDER_SITE_OTHER): Payer: Medicaid Other | Admitting: Obstetrics

## 2014-05-01 ENCOUNTER — Encounter: Payer: Self-pay | Admitting: Obstetrics

## 2014-05-01 DIAGNOSIS — Z30018 Encounter for initial prescription of other contraceptives: Secondary | ICD-10-CM

## 2014-05-01 NOTE — Progress Notes (Signed)
Subjective:     Erin Jacobs is a 18 y.o. female who presents for a postpartum visit. She is 6 weeks postpartum following a spontaneous vaginal delivery. I have fully reviewed the prenatal and intrapartum course. The delivery was at 40.6 gestational weeks. Outcome: spontaneous vaginal delivery. Anesthesia: epidural. Postpartum course has been normal. Baby's course has been normal. Baby is feeding by bottle Dory Horn. Bleeding no bleeding. Bowel function is normal. Bladder function is normal. Patient is sexually active. Contraception method is none. Postpartum depression screening: negative.  Tobacco, alcohol and substance abuse history reviewed.  Adult immunizations reviewed including TDAP, rubella and varicella.  The following portions of the patient's history were reviewed and updated as appropriate: allergies, current medications, past family history, past medical history, past social history, past surgical history and problem list.  Review of Systems A comprehensive review of systems was negative.   Objective:    BP 109/77  Pulse 80  Temp(Src) 98.4 F (36.9 C)  Wt 198 lb (89.812 kg)  LMP 04/10/2014  General:  alert and no distress   Breasts:  inspection negative, no nipple discharge or bleeding, no masses or nodularity palpable  Lungs: clear to auscultation bilaterally  Heart:  regular rate and rhythm, S1, S2 normal, no murmur, click, rub or gallop  Abdomen: normal findings: soft, non-tender   Vulva:  normal  Vagina: normal vagina  Cervix:  no cervical motion tenderness  Corpus: normal size, contour, position, consistency, mobility, non-tender  Adnexa:  no mass, fullness, tenderness  Rectal Exam: Not performed.           Assessment:     Normal postpartum exam. Pap smear not done at today's visit.  Plan:    1. Contraception: Nexplanon 2. Nexplanon Rx. 3. Follow up in: 2 weeks for Nexplanon.  Healthy lifestyle practices reviewed

## 2014-05-07 ENCOUNTER — Ambulatory Visit: Payer: Medicaid Other | Admitting: Obstetrics

## 2014-05-11 ENCOUNTER — Telehealth: Payer: Self-pay | Admitting: *Deleted

## 2014-05-11 NOTE — Telephone Encounter (Signed)
Patient is interested in the Nexplanon for birth control. Attempted to contact patient and left message for patient to contact the office.

## 2014-05-15 ENCOUNTER — Ambulatory Visit: Payer: Medicaid Other | Admitting: Obstetrics

## 2014-05-18 ENCOUNTER — Encounter: Payer: Self-pay | Admitting: Obstetrics

## 2014-05-19 ENCOUNTER — Ambulatory Visit: Payer: Medicaid Other | Admitting: Obstetrics

## 2014-06-12 NOTE — Telephone Encounter (Signed)
Patient states she is currently on her menstrual cycle and has not been sexually active in over a month. Patient scheduled for her Nexplanon Insertion for 06-18-14 @4 :15p. Patient advised not to have intercourse until after Nexplanon Insertion.

## 2014-06-18 ENCOUNTER — Ambulatory Visit: Payer: Medicaid Other | Admitting: Obstetrics

## 2014-06-23 ENCOUNTER — Ambulatory Visit: Payer: Medicaid Other | Admitting: Obstetrics

## 2014-07-13 ENCOUNTER — Encounter: Payer: Self-pay | Admitting: *Deleted

## 2014-07-14 ENCOUNTER — Encounter: Payer: Self-pay | Admitting: Obstetrics & Gynecology

## 2016-02-12 ENCOUNTER — Encounter (HOSPITAL_COMMUNITY): Payer: Self-pay | Admitting: Emergency Medicine

## 2016-02-12 ENCOUNTER — Ambulatory Visit (HOSPITAL_COMMUNITY)
Admission: EM | Admit: 2016-02-12 | Discharge: 2016-02-12 | Disposition: A | Discharge status: Home / Self Care | Payer: Medicaid Other | Attending: Emergency Medicine | Admitting: Emergency Medicine

## 2016-02-12 DIAGNOSIS — N898 Other specified noninflammatory disorders of vagina: Secondary | ICD-10-CM

## 2016-02-12 DIAGNOSIS — R109 Unspecified abdominal pain: Secondary | ICD-10-CM | POA: Insufficient documentation

## 2016-02-12 DIAGNOSIS — N9489 Other specified conditions associated with female genital organs and menstrual cycle: Secondary | ICD-10-CM

## 2016-02-12 DIAGNOSIS — M545 Low back pain, unspecified: Secondary | ICD-10-CM

## 2016-02-12 DIAGNOSIS — R252 Cramp and spasm: Secondary | ICD-10-CM

## 2016-02-12 LAB — POCT URINALYSIS DIP (DEVICE)
Bilirubin Urine: NEGATIVE
GLUCOSE, UA: NEGATIVE mg/dL
HGB URINE DIPSTICK: NEGATIVE
KETONES UR: NEGATIVE mg/dL
Leukocytes, UA: NEGATIVE
Nitrite: NEGATIVE
PROTEIN: NEGATIVE mg/dL
Specific Gravity, Urine: >=1.03 (ref 1.005–1.030)
UROBILINOGEN UA: 0.2 mg/dL (ref 0.0–1.0)
pH: 6 (ref 5.0–8.0)

## 2016-02-12 LAB — POCT I-STAT, CHEM 8
BUN: 7 mg/dL (ref 6–20)
CALCIUM ION: 1.15 mmol/L (ref 1.13–1.30)
CHLORIDE: 109 mmol/L (ref 101–111)
CREATININE: 0.8 mg/dL (ref 0.44–1.00)
GLUCOSE: 79 mg/dL (ref 65–99)
HCT: 35 % — ABNORMAL LOW (ref 36.0–46.0)
Hemoglobin: 11.9 g/dL — ABNORMAL LOW (ref 12.0–15.0)
Potassium: 3.5 mmol/L (ref 3.5–5.1)
SODIUM: 141 mmol/L (ref 135–145)
TCO2: 26 mmol/L (ref 0–100)

## 2016-02-12 LAB — POCT PREGNANCY, URINE: Preg Test, Ur: NEGATIVE

## 2016-02-12 MED ORDER — IBUPROFEN 600 MG PO TABS: 600.0000 mg | ORAL_TABLET | Freq: Three times a day (TID) | ORAL | 0 refills | Status: DC

## 2016-02-12 NOTE — Discharge Instructions (Signed)
Your back pain is coming from where your hip bone joints her back bone. Be careful when you lift up your son to avoid putting strain on this joint. Take ibuprofen 600 mg 3 times a day for 1 week. After that, you can use as needed for pain.  It sounds like you are having cramping in your thigh at night. Your blood work is normal. Get tonic water that has quinine in it and drink a glass before bedtime.  We have collected swabs for the odor. We will call you with the results and treat based on the results.  Follow-up as needed.

## 2016-02-12 NOTE — ED Provider Notes (Signed)
[place]    CSN: [id number] Arrival date & time: 02/12/16  1321  First Provider Contact:  First MD Initiated Contact with Patient 02/12/16 1516        History   Chief Complaint Chief Complaint  Patient presents with  . Flank Pain  . Leg Pain    HPI Erin Jacobs is a 20 y.o. female.   She is a 20 year old woman here with several concerns. She reports a 2 to three-day history of vaginal odor. She does have some discharge, but states it is her normal discharge. Was associated with a little bit of itching, but no discomfort or pain. No dysuria or urinary symptoms. No abdominal pain. No new sexual partners. She also states for the last 4-5 days she has had an intermittent pain in her right lower back. It occurs when she moves a certain way. It does not radiate. No numbness, tingling, weakness of the lower extremities. No known injury or trauma, but she does have a 12-month-old son that she picks up frequently. Last, she reports intermittent pains in her left thigh. She states that sent to occur at night. It will last for 2-3 minutes and is described as someone twisting and knotting the muscle. It resolves spontaneously. Her boyfriend has tried massaging it during the events and this is very uncomfortable for her. This occurs every other night.    Past Medical History:  Diagnosis Date  . Medical history non-contributory     Patient Active Problem List   Diagnosis Date Noted  . Anemia, postpartum 03/12/2014  . Normal delivery 03/11/2014  . Post-dates pregnancy 03/09/2014  . Supervision of normal first pregnancy 08/15/2013    Past Surgical History:  Procedure Laterality Date  . NO PAST SURGERIES      OB History    Gravida Para Term Preterm AB Living   2 1 1  0 1 1   SAB TAB Ectopic Multiple Live Births   1 0 0 0         Home Medications    Prior to Admission medications   Medication Sig Start Date End Date Taking? Authorizing Provider  ibuprofen  (ADVIL,MOTRIN) 600 MG tablet Take 1 tablet (600 mg total) by mouth 3 (three) times daily. For 1 week, then as needed for pain 02/12/16   Melony Overly, MD    Family History Family History  Problem Relation Age of Onset  . Diabetes Maternal Aunt   . Diabetes Maternal Uncle     Social History Social History  Substance Use Topics  . Smoking status: Never Smoker  . Smokeless tobacco: Never Used  . Alcohol use No     Allergies   Review of patient's allergies indicates no known allergies.   Review of Systems Review of Systems  Constitutional: Negative for fever.  Gastrointestinal: Negative for abdominal pain, nausea and vomiting.  Genitourinary: Negative for dysuria, hematuria, pelvic pain, vaginal bleeding, vaginal discharge and vaginal pain.       Vaginal odor  Musculoskeletal: Positive for back pain.       Leg cramps     Physical Exam Triage Vital Signs ED Triage Vitals  Enc Vitals Group     BP 02/12/16 1506 117/70     Pulse Rate 02/12/16 1506 60     Resp 02/12/16 1506 16     Temp 02/12/16 1506 97.5 F (36.4 C)     Temp Source 02/12/16 1506 Oral     SpO2 02/12/16 1506 100 %  Weight --      Height --      Head Circumference --      Peak Flow --      Pain Score 02/12/16 1515 6     Pain Loc --      Pain Edu? --      Excl. in [place]? --    No data found.   Updated Vital Signs BP 117/70 (BP Location: Left Arm)   Pulse 60   Temp 97.5 F (36.4 C) (Oral)   Resp 16   LMP 02/07/2016   SpO2 100%   Visual Acuity Right Eye Distance:   Left Eye Distance:   Bilateral Distance:    Right Eye Near:   Left Eye Near:    Bilateral Near:     Physical Exam  Constitutional: She is oriented to person, place, and time. She appears well-developed and well-nourished. No distress.  Cardiovascular: Normal rate.   Pulmonary/Chest: Effort normal.  Genitourinary: Vagina normal. There is no rash on the right labia. There is no rash on the left labia. Cervix exhibits no motion  tenderness and no discharge. No bleeding in the vagina. No foreign body in the vagina. No vaginal discharge found.  Musculoskeletal:  Back: No erythema or edema. No vertebral tenderness or step-offs. She is point tender at the superior aspect of the right SI joint. Left leg: No erythema or edema. No tenderness. Full active range of motion.  Neurological: She is alert and oriented to person, place, and time.     UC Treatments / Results  Labs (all labs ordered are listed, but only abnormal results are displayed) Labs Reviewed  POCT I-STAT, CHEM 8 - Abnormal; Notable for the following:       Result Value   Hemoglobin 11.9 (*)    HCT 35.0 (*)    All other components within normal limits  POCT URINALYSIS DIP (DEVICE)  POCT PREGNANCY, URINE  CERVICOVAGINAL ANCILLARY ONLY    EKG  EKG Interpretation None       Radiology No results found.  Procedures Procedures (including critical care time)  Medications Ordered in UC Medications - No data to display   Initial Impression / Assessment and Plan / UC Course  I have reviewed the triage vital signs and the nursing notes.  Pertinent labs & imaging results that were available during my care of the patient were reviewed by me and considered in my medical decision making (see chart for details).  Clinical Course    We'll treat back pain with ibuprofen. Recommended tonic water to help with nighttime muscle cramps. Vaginal swabs collected for the vaginal odor. Will await results before treating. Follow-up as needed.  Final Clinical Impressions(s) / UC Diagnoses   Final diagnoses:  Right-sided low back pain without sciatica  Cramps of left lower extremity  Vaginal odor    New Prescriptions New Prescriptions   IBUPROFEN (ADVIL,MOTRIN) 600 MG TABLET    Take 1 tablet (600 mg total) by mouth 3 (three) times daily. For 1 week, then as needed for pain     Melony Overly, MD 02/12/16 1606

## 2016-02-12 NOTE — ED Triage Notes (Signed)
Patient c/o right side flank pain and left leg pain intermittently. Patient reports that in regards to her flank pain she has been irregular with menstrual periods. Patient also reports vaginal odor.

## 2016-02-14 LAB — CERVICOVAGINAL ANCILLARY ONLY
Chlamydia: NEGATIVE
Neisseria Gonorrhea: NEGATIVE

## 2016-02-15 LAB — CERVICOVAGINAL ANCILLARY ONLY: WET PREP (BD AFFIRM): NEGATIVE

## 2016-02-19 ENCOUNTER — Telehealth (HOSPITAL_COMMUNITY): Payer: Self-pay | Admitting: Emergency Medicine

## 2016-02-19 NOTE — Telephone Encounter (Addendum)
-----   Message from Melony Overly, MD sent at 02/17/2016  2:57 PM EDT ----- Please notify patient of negative STD testing.  No BV or yeast.  Vaginal odor is likely normal variant and should improve on its own.  Follow up as needed. 02/19/2016 verified identity.  Notified patient that tests were all negative and concerns for odor were a normal occurrence that would improve over time.  Instructed to return for any new symptoms.

## 2016-02-23 NOTE — Progress Notes (Signed)
02/19/2016 10:20 am identity verified.  Talked to patient, notified of results

## 2016-07-01 ENCOUNTER — Inpatient Hospital Stay (HOSPITAL_COMMUNITY): Payer: Self-pay

## 2016-07-01 ENCOUNTER — Inpatient Hospital Stay (HOSPITAL_COMMUNITY)
Admission: AD | Admit: 2016-07-01 | Discharge: 2016-07-01 | Disposition: A | Discharge status: Home / Self Care | Payer: Self-pay | Source: Ambulatory Visit | Attending: Obstetrics & Gynecology | Admitting: Obstetrics & Gynecology

## 2016-07-01 ENCOUNTER — Encounter (HOSPITAL_COMMUNITY): Payer: Self-pay | Admitting: *Deleted

## 2016-07-01 DIAGNOSIS — R102 Pelvic and perineal pain: Secondary | ICD-10-CM

## 2016-07-01 DIAGNOSIS — B9689 Other specified bacterial agents as the cause of diseases classified elsewhere: Secondary | ICD-10-CM

## 2016-07-01 DIAGNOSIS — O26891 Other specified pregnancy related conditions, first trimester: Secondary | ICD-10-CM | POA: Insufficient documentation

## 2016-07-01 DIAGNOSIS — R109 Unspecified abdominal pain: Secondary | ICD-10-CM | POA: Insufficient documentation

## 2016-07-01 DIAGNOSIS — N760 Acute vaginitis: Secondary | ICD-10-CM

## 2016-07-01 DIAGNOSIS — Z3491 Encounter for supervision of normal pregnancy, unspecified, first trimester: Secondary | ICD-10-CM

## 2016-07-01 DIAGNOSIS — Z3A01 Less than 8 weeks gestation of pregnancy: Secondary | ICD-10-CM | POA: Insufficient documentation

## 2016-07-01 LAB — URINALYSIS, ROUTINE W REFLEX MICROSCOPIC
Bilirubin Urine: NEGATIVE
Glucose, UA: NEGATIVE mg/dL
Hgb urine dipstick: NEGATIVE
Ketones, ur: NEGATIVE mg/dL
Nitrite: NEGATIVE
PH: 5 (ref 5.0–8.0)
Protein, ur: NEGATIVE mg/dL
SPECIFIC GRAVITY, URINE: 1.028 (ref 1.005–1.030)

## 2016-07-01 LAB — WET PREP, GENITAL
Sperm: NONE SEEN
TRICH WET PREP: NONE SEEN
YEAST WET PREP: NONE SEEN

## 2016-07-01 LAB — CBC
HCT: 35.1 % — ABNORMAL LOW (ref 36.0–46.0)
Hemoglobin: 11.7 g/dL — ABNORMAL LOW (ref 12.0–15.0)
MCH: 27.3 pg (ref 26.0–34.0)
MCHC: 33.3 g/dL (ref 30.0–36.0)
MCV: 81.8 fL (ref 78.0–100.0)
PLATELETS: 316 10*3/uL (ref 150–400)
RBC: 4.29 MIL/uL (ref 3.87–5.11)
RDW: 15.2 % (ref 11.5–15.5)
WBC: 5.5 10*3/uL (ref 4.0–10.5)

## 2016-07-01 LAB — HCG, QUANTITATIVE, PREGNANCY: hCG, Beta Chain, Quant, S: 15026 m[IU]/mL — ABNORMAL HIGH (ref <5)

## 2016-07-01 LAB — POCT PREGNANCY, URINE: Preg Test, Ur: POSITIVE — AB

## 2016-07-01 MED ORDER — METRONIDAZOLE 500 MG PO TABS: 500.0000 mg | ORAL_TABLET | Freq: Two times a day (BID) | ORAL | 0 refills | Status: DC

## 2016-07-01 MED ORDER — GI COCKTAIL ~~LOC~~
30.0000 mL | Freq: Once | ORAL | Status: AC
  Administered 2016-07-01: 30 mL via ORAL
  Filled 2016-07-01: qty 30

## 2016-07-01 NOTE — MAU Note (Signed)
Been having some pain in upper abd, sharp pain and burning since the end of November.  On Wed, had 2 +HPT.  Diarrhea started last Tues, states is watery. Only once today. Has only vomited once

## 2016-07-01 NOTE — MAU Provider Note (Signed)
Chief Complaint: Abdominal Pain and Possible Pregnancy   None     SUBJECTIVE HPI: Erin Jacobs is a 20 y.o. G3P1011 [redacted]w[redacted]d by unsure LMP who presents to maternity admissions reporting positive pregnancy test and abdominal pain x 3 weeks.  She reports her pain is mostly burning in her upper abdomen that is improved with food intake but she also has lower abdominal cramping that is intermittent. She has not tried any treatments, nothing seems to make it worse, eating makes it a little better.  She denies vaginal bleeding, vaginal itching/burning, urinary symptoms, h/a, dizziness, n/v, or fever/chills.     HPI  Past Medical History:  Diagnosis Date  . Medical history non-contributory    Past Surgical History:  Procedure Laterality Date  . NO PAST SURGERIES     Social History   Social History  . Marital status: Single    Spouse name: N/A  . Number of children: N/A  . Years of education: N/A   Occupational History  . Not on file.   Social History Main Topics  . Smoking status: Never Smoker  . Smokeless tobacco: Never Used  . Alcohol use No  . Drug use: No  . Sexual activity: Yes    Birth control/ protection: None   Other Topics Concern  . Not on file   Social History Narrative  . No narrative on file   No current facility-administered medications on file prior to encounter.    No current outpatient prescriptions on file prior to encounter.   No Known Allergies  ROS:  Review of Systems  Constitutional: Negative for chills, fatigue and fever.  Respiratory: Negative for shortness of breath.   Cardiovascular: Negative for chest pain.  Gastrointestinal: Positive for abdominal pain.  Genitourinary: Positive for pelvic pain. Negative for difficulty urinating, dysuria, flank pain, vaginal bleeding, vaginal discharge and vaginal pain.  Neurological: Negative for dizziness and headaches.  Psychiatric/Behavioral: Negative.      I have reviewed patient's Past Medical Hx,  Surgical Hx, Family Hx, Social Hx, medications and allergies.   Physical Exam  Patient Vitals for the past 24 hrs:  BP Temp Temp src Pulse Resp Height Weight  07/01/16 1438 118/72 98.4 F (36.9 C) Oral 63 16 5\' 5"  (1.651 m) 188 lb 9.6 oz (85.5 kg)   Constitutional: Well-developed, well-nourished female in no acute distress.  Cardiovascular: normal rate Respiratory: normal effort GI: Abd soft, non-tender. Pos BS x 4 MS: Extremities nontender, no edema, normal ROM Neurologic: Alert and oriented x 4.  GU: Neg CVAT.  PELVIC EXAM: Cervix pink, visually closed, without lesion, scant white creamy discharge, vaginal walls and external genitalia normal Bimanual exam: Cervix 0/long/high, firm, anterior, neg CMT, uterus nontender, nonenlarged, adnexa without tenderness, enlargement, or mass   LAB RESULTS Results for orders placed or performed during the hospital encounter of 07/01/16 (from the past 24 hour(s))  Urinalysis, Routine w reflex microscopic     Status: Abnormal   Collection Time: 07/01/16  2:38 PM  Result Value Ref Range   Color, Urine YELLOW YELLOW   APPearance HAZY (A) CLEAR   Specific Gravity, Urine 1.028 1.005 - 1.030   pH 5.0 5.0 - 8.0   Glucose, UA NEGATIVE NEGATIVE mg/dL   Hgb urine dipstick NEGATIVE NEGATIVE   Bilirubin Urine NEGATIVE NEGATIVE   Ketones, ur NEGATIVE NEGATIVE mg/dL   Protein, ur NEGATIVE NEGATIVE mg/dL   Nitrite NEGATIVE NEGATIVE   Leukocytes, UA TRACE (A) NEGATIVE   RBC / HPF 0-5 0 -  5 RBC/hpf   WBC, UA 0-5 0 - 5 WBC/hpf   Bacteria, UA RARE (A) NONE SEEN   Squamous Epithelial / LPF 0-5 (A) NONE SEEN   Mucous PRESENT   Pregnancy, urine POC     Status: Abnormal   Collection Time: 07/01/16  2:55 PM  Result Value Ref Range   Preg Test, Ur POSITIVE (A) NEGATIVE       IMAGING No results found.  Pt to OB US for ectopic evaluation.  Report to Joycelyn Rua, NP   Fatima Blank Certified Nurse-Midwife 07/01/2016  3:45 PM  `1720 Pt  back from U/S, labs back. Results for orders placed or performed during the hospital encounter of 07/01/16 (from the past 24 hour(s))  Urinalysis, Routine w reflex microscopic     Status: Abnormal   Collection Time: 07/01/16  2:38 PM  Result Value Ref Range   Color, Urine YELLOW YELLOW   APPearance HAZY (A) CLEAR   Specific Gravity, Urine 1.028 1.005 - 1.030   pH 5.0 5.0 - 8.0   Glucose, UA NEGATIVE NEGATIVE mg/dL   Hgb urine dipstick NEGATIVE NEGATIVE   Bilirubin Urine NEGATIVE NEGATIVE   Ketones, ur NEGATIVE NEGATIVE mg/dL   Protein, ur NEGATIVE NEGATIVE mg/dL   Nitrite NEGATIVE NEGATIVE   Leukocytes, UA TRACE (A) NEGATIVE   RBC / HPF 0-5 0 - 5 RBC/hpf   WBC, UA 0-5 0 - 5 WBC/hpf   Bacteria, UA RARE (A) NONE SEEN   Squamous Epithelial / LPF 0-5 (A) NONE SEEN   Mucous PRESENT   Pregnancy, urine POC     Status: Abnormal   Collection Time: 07/01/16  2:55 PM  Result Value Ref Range   Preg Test, Ur POSITIVE (A) NEGATIVE  Wet prep, genital     Status: Abnormal   Collection Time: 07/01/16  3:50 PM  Result Value Ref Range   Yeast Wet Prep HPF POC NONE SEEN NONE SEEN   Trich, Wet Prep NONE SEEN NONE SEEN   Clue Cells Wet Prep HPF POC PRESENT (A) NONE SEEN   WBC, Wet Prep HPF POC MANY (A) NONE SEEN   Sperm NONE SEEN   CBC     Status: Abnormal   Collection Time: 07/01/16  3:51 PM  Result Value Ref Range   WBC 5.5 4.0 - 10.5 K/uL   RBC 4.29 3.87 - 5.11 MIL/uL   Hemoglobin 11.7 (L) 12.0 - 15.0 g/dL   HCT 35.1 (L) 36.0 - 46.0 %   MCV 81.8 78.0 - 100.0 fL   MCH 27.3 26.0 - 34.0 pg   MCHC 33.3 30.0 - 36.0 g/dL   RDW 15.2 11.5 - 15.5 %   Platelets 316 150 - 400 K/uL  hCG, quantitative, pregnancy     Status: Abnormal   Collection Time: 07/01/16  3:51 PM  Result Value Ref Range   hCG, Beta Chain, Quant, S 15,026 (H) <5 mIU/mL    US Ob Comp Less 14 Wks  Result Date: 07/01/2016 CLINICAL DATA:  Pelvic cramping for the past 3 weeks. 6 weeks and 2 days pregnant by estimated last  menstrual period. EXAM: OBSTETRIC <14 WK [place] AND TRANSVAGINAL OB US TECHNIQUE: Both transabdominal and transvaginal ultrasound examinations were performed for complete evaluation of the gestation as well as the maternal uterus, adnexal regions, and pelvic cul-de-sac. Transvaginal technique was performed to assess early pregnancy. COMPARISON:  07/11/2013. FINDINGS: Intrauterine gestational sac: Visualized. Yolk sac:  Visualized Embryo:  Visualized Cardiac Activity: Visualized Heart Rate: 89  bpm CRL:  2.3  mm   5 w   5 d                  [place] EDC: 02/25/2017 Subchorionic hemorrhage:  Small Maternal uterus/adnexae: Normal appearing maternal ovaries. Trace amount of free peritoneal fluid. IMPRESSION: 1. Single live intrauterine gestation with an estimated gestational age of [redacted] weeks and 5 days. 2. Small subchorionic hemorrhage. Electronically Signed   By: Claudie Revering M.D.   On: 07/01/2016 17:06   US Ob Transvaginal  Result Date: 07/01/2016 CLINICAL DATA:  Pelvic cramping for the past 3 weeks. 6 weeks and 2 days pregnant by estimated last menstrual period. EXAM: OBSTETRIC <14 WK [place] AND TRANSVAGINAL OB US TECHNIQUE: Both transabdominal and transvaginal ultrasound examinations were performed for complete evaluation of the gestation as well as the maternal uterus, adnexal regions, and pelvic cul-de-sac. Transvaginal technique was performed to assess early pregnancy. COMPARISON:  07/11/2013. FINDINGS: Intrauterine gestational sac: Visualized. Yolk sac:  Visualized Embryo:  Visualized Cardiac Activity: Visualized Heart Rate: 89  bpm CRL:  2.3  mm   5 w   5 d                  [place] EDC: 02/25/2017 Subchorionic hemorrhage:  Small Maternal uterus/adnexae: Normal appearing maternal ovaries. Trace amount of free peritoneal fluid. IMPRESSION: 1. Single live intrauterine gestation with an estimated gestational age of [redacted] weeks and 5 days. 2. Small subchorionic hemorrhage. Electronically Signed   By: Claudie Revering M.D.   On: 07/01/2016  17:06    A/P Results reviewed with the pt  5 5/7 wks IUP, EDC 8/12. 6 2/7 by dates BV- tx with Flagyl GC/CT pending Pregnancy confirmation letter to pt OTC tx for reflux reviewed Plans care with [facility]

## 2016-07-01 NOTE — Discharge Instructions (Signed)
Abdominal Pain During Pregnancy  Abdominal pain is common in pregnancy. Most of the time, it does not cause harm. There are many causes of abdominal pain. Some causes are more serious than others and sometimes the cause is not known. Abdominal pain can be a sign that something is very wrong with the pregnancy or the pain may have nothing to do with the pregnancy. Always tell your health care provider if you have any abdominal pain.  Follow these instructions at home:  · Do not have sex or put anything in your vagina until your symptoms go away completely.  · Watch your abdominal pain for any changes.  · Get plenty of rest until your pain improves.  · Drink enough fluid to keep your urine clear or pale yellow.  · Take over-the-counter or prescription medicines only as told by your health care provider.  · Keep all follow-up visits as told by your health care provider. This is important.  Contact a health care provider if:  · You have a fever.  · Your pain gets worse or you have cramping.  · Your pain continues after resting.  Get help right away if:  · You are bleeding, leaking fluid, or passing tissue from the vagina.  · You have vomiting or diarrhea that does not go away.  · You have painful or bloody urination.  · You notice a decrease in your baby's movements.  · You feel very weak or faint.  · You have shortness of breath.  · You develop a severe headache with abdominal pain.  · You have abnormal vaginal discharge with abdominal pain.  This information is not intended to replace advice given to you by your health care provider. Make sure you discuss any questions you have with your health care provider.  Document Released: 07/03/2005 Document Revised: 04/13/2016 Document Reviewed: 01/30/2013  Elsevier Interactive Patient Education © 2017 [employer]

## 2016-07-02 LAB — HIV ANTIBODY (ROUTINE TESTING W REFLEX): HIV SCREEN 4TH GENERATION: NONREACTIVE

## 2016-07-03 LAB — GC/CHLAMYDIA PROBE AMP (~~LOC~~) NOT AT ARMC
CHLAMYDIA, DNA PROBE: NEGATIVE
NEISSERIA GONORRHEA: NEGATIVE

## 2016-07-17 NOTE — L&D Delivery Note (Signed)
Patient is 21 y.o. G3P1011 [redacted]w[redacted]d admitted for SOL.Augmentation with Pitocin. AROM at 1500.  Prenatal course uncomplicated  Delivery Note At 7:08 PM a viable female was delivered via Vaginal, Spontaneous Delivery (Presentation:ROA ; Cephalic ).  APGAR: 9, 9; weight pending .   Placenta status: intact , spontaneous.  Cord 3v:  with the no complications.   Anesthesia: N2O Episiotomy: None Lacerations:  Right sulcal  Suture Repair: Monocryl Est. Blood Loss (mL):  200  Mom to postpartum.  Baby to Couplet care / Skin to Skin.  Georgina Snell P Cox 02/23/2017, 7:50 PM     Upon arrival patient was complete and pushing. She pushed with good maternal effort to deliver a viable female infant in cephalic, ROA position. No nuchal cord presentBaby delivered without difficulty was noted to have good tone and place on maternal abdomen for oral suctioning, drying and stimulation. Delayed cord clamping performed. Placenta delivered spontaneously with gentle cord traction. Fundus firm with massage and Pitocin. Perineum inspected and found to have right sulcal laceration, which was repaired with monocryl with good hemostasis achieved. Counts of sharps, instruments, and lap pads were all correct.  Patient is a G3P1011 at [redacted]w[redacted]d who was admitted in latent labor, essentially uncomplicated prenatal course.  She progressed with augmentation via AROM/Pit.  I was gloved and present for delivery in its entirety.  Second stage of labor progressed, baby delivered after a few contractions.  Mild decels during second stage noted.  Complications: none  Lacerations: right sulcus  EBL: 200cc  SHAW, KIMBERLY, CNM 10:02 PM 02/23/2017

## 2016-08-16 ENCOUNTER — Other Ambulatory Visit (HOSPITAL_COMMUNITY)
Admission: RE | Admit: 2016-08-16 | Discharge: 2016-08-16 | Disposition: A | Discharge status: Home / Self Care | Payer: Medicaid Other | Source: Ambulatory Visit | Attending: Obstetrics and Gynecology | Admitting: Obstetrics and Gynecology

## 2016-08-16 ENCOUNTER — Ambulatory Visit (INDEPENDENT_AMBULATORY_CARE_PROVIDER_SITE_OTHER): Payer: Medicaid Other | Admitting: Obstetrics

## 2016-08-16 ENCOUNTER — Encounter: Payer: Self-pay | Admitting: Obstetrics and Gynecology

## 2016-08-16 ENCOUNTER — Encounter: Payer: Self-pay | Admitting: Obstetrics

## 2016-08-16 VITALS — BP 119/79 | HR 108 | Wt 189.0 lb

## 2016-08-16 DIAGNOSIS — Z01419 Encounter for gynecological examination (general) (routine) without abnormal findings: Secondary | ICD-10-CM | POA: Diagnosis not present

## 2016-08-16 DIAGNOSIS — Z3481 Encounter for supervision of other normal pregnancy, first trimester: Secondary | ICD-10-CM

## 2016-08-16 DIAGNOSIS — Z3480 Encounter for supervision of other normal pregnancy, unspecified trimester: Secondary | ICD-10-CM

## 2016-08-16 DIAGNOSIS — Z3490 Encounter for supervision of normal pregnancy, unspecified, unspecified trimester: Secondary | ICD-10-CM | POA: Insufficient documentation

## 2016-08-16 DIAGNOSIS — Z3400 Encounter for supervision of normal first pregnancy, unspecified trimester: Secondary | ICD-10-CM

## 2016-08-16 MED ORDER — VITAFOL ULTRA 29-0.6-0.4-200 MG PO CAPS: 1.0000 | ORAL_CAPSULE | Freq: Every day | ORAL | 4 refills | Status: DC

## 2016-08-16 NOTE — Progress Notes (Signed)
Subjective:  Erin Jacobs is a 21 y.o. G3P1011 at [redacted]w[redacted]d being seen today for ongoing prenatal care.  She is currently monitored for the following issues for this low-risk pregnancy and has Supervision of normal first pregnancy; Post-dates pregnancy; Normal delivery; Anemia, postpartum; and Supervision of normal pregnancy, antepartum on her problem list.  Patient reports no complaints.  Contractions: Not present. Vag. Bleeding: None.  Movement: Present. Denies leaking of fluid.   The following portions of the patient's history were reviewed and updated as appropriate: allergies, current medications, past family history, past medical history, past social history, past surgical history and problem list. Problem list updated.  Objective:   Vitals:   08/16/16 1411  BP: 119/79  Pulse: (!) 108  Weight: 189 lb (85.7 kg)    Fetal Status: Fetal Heart Rate (bpm): 150   Movement: Present     General:  Alert, oriented and cooperative. Patient is in no acute distress.  Skin: Skin is warm and dry. No rash noted.   Cardiovascular: Normal heart rate noted  Respiratory: Normal respiratory effort, no problems with respiration noted  Abdomen: Soft, gravid, appropriate for gestational age. Pain/Pressure: Absent     Pelvic:  Cervical exam deferred        Extremities: Normal range of motion.  Edema: None  Mental Status: Normal mood and affect. Normal behavior. Normal judgment and thought content.   Urinalysis:      Assessment and Plan:  Pregnancy: G3P1011 at [redacted]w[redacted]d  1. Supervision of normal first pregnancy, antepartum Labs: - Cytology - PAP - Hemoglobinopathy evaluation - Varicella zoster antibody, IgG - Culture, OB Urine - Prenatal Profile I - ToxASSURE Select 13 (MW), Urine - Prenat-Fe Poly-Methfol-FA-DHA (VITAFOL ULTRA) 29-0.6-0.4-200 MG CAPS; Take 1 capsule by mouth daily before breakfast.  Dispense: 90 capsule; Refill: 4  Preterm labor symptoms and general obstetric precautions including  but not limited to vaginal bleeding, contractions, leaking of fluid and fetal movement were reviewed in detail with the patient. Please refer to After Visit Summary for other counseling recommendations.  No Follow-up on file.   Shelly Bombard, MDPatient ID: Erin Jacobs, female   DOB: 1996-05-03, 21 y.o.   MRN: [id number]

## 2016-08-16 NOTE — Progress Notes (Signed)
Patient reports she is doing well today. 

## 2016-08-18 LAB — URINE CULTURE, OB REFLEX: Organism ID, Bacteria: NO GROWTH

## 2016-08-18 LAB — CULTURE, OB URINE

## 2016-08-19 LAB — HEMOGLOBINOPATHY EVALUATION
HEMOGLOBIN A2 QUANTITATION: 2.1 % (ref 1.8–3.2)
HEMOGLOBIN F QUANTITATION: 0 % (ref 0.0–2.0)
HGB C: 0 %
HGB S: 0 %
HGB VARIANT: 0 %
Hgb A: 97.9 % (ref 96.4–98.8)

## 2016-08-19 LAB — PRENATAL PROFILE I(LABCORP)
ANTIBODY SCREEN: NEGATIVE
Basophils Absolute: 0 10*3/uL (ref 0.0–0.2)
Basos: 0 %
EOS (ABSOLUTE): 0.2 10*3/uL (ref 0.0–0.4)
EOS: 3 %
Hematocrit: 36.5 % (ref 34.0–46.6)
Hemoglobin: 11.5 g/dL (ref 11.1–15.9)
Hepatitis B Surface Ag: NEGATIVE
IMMATURE GRANS (ABS): 0 10*3/uL (ref 0.0–0.1)
Immature Granulocytes: 0 %
Lymphocytes Absolute: 2.5 10*3/uL (ref 0.7–3.1)
Lymphs: 37 %
MCH: 27.4 pg (ref 26.6–33.0)
MCHC: 31.5 g/dL (ref 31.5–35.7)
MCV: 87 fL (ref 79–97)
Monocytes Absolute: 0.8 10*3/uL (ref 0.1–0.9)
Monocytes: 12 %
NEUTROS PCT: 48 %
Neutrophils Absolute: 3.2 10*3/uL (ref 1.4–7.0)
PLATELETS: 334 10*3/uL (ref 150–379)
RBC: 4.2 x10E6/uL (ref 3.77–5.28)
RDW: 15.8 % — ABNORMAL HIGH (ref 12.3–15.4)
RH TYPE: POSITIVE
RPR Ser Ql: NONREACTIVE
RUBELLA: 12.9 {index} (ref >0.99)
WBC: 6.7 10*3/uL (ref 3.4–10.8)

## 2016-08-19 LAB — VARICELLA ZOSTER ANTIBODY, IGG

## 2016-08-21 LAB — CYTOLOGY - PAP: Diagnosis: NEGATIVE

## 2016-08-24 LAB — TOXASSURE SELECT 13 (MW), URINE

## 2016-09-06 ENCOUNTER — Ambulatory Visit (INDEPENDENT_AMBULATORY_CARE_PROVIDER_SITE_OTHER): Payer: Medicaid Other | Admitting: Obstetrics

## 2016-09-06 ENCOUNTER — Encounter: Payer: Self-pay | Admitting: Obstetrics

## 2016-09-06 ENCOUNTER — Encounter: Payer: Medicaid Other | Admitting: Obstetrics

## 2016-09-06 VITALS — BP 105/70 | HR 101 | Wt 191.8 lb

## 2016-09-06 DIAGNOSIS — Z3480 Encounter for supervision of other normal pregnancy, unspecified trimester: Secondary | ICD-10-CM

## 2016-09-06 DIAGNOSIS — Z3482 Encounter for supervision of other normal pregnancy, second trimester: Secondary | ICD-10-CM

## 2016-09-06 NOTE — Progress Notes (Signed)
Patient is concerned about the lack of movement at this point- she  Was feeling more with the last pregnancy.

## 2016-09-06 NOTE — Progress Notes (Signed)
Subjective:  Erin Jacobs is a 21 y.o. G3P1011 at [redacted]w[redacted]d being seen today for ongoing prenatal care.  She is currently monitored for the following issues for this low-risk pregnancy and has Supervision of normal first pregnancy; Post-dates pregnancy; Normal delivery; Anemia, postpartum; and Supervision of normal pregnancy, antepartum on her problem list.  Patient reports no complaints.  Contractions: Not present. Vag. Bleeding: None.  Movement: Absent. Denies leaking of fluid.   The following portions of the patient's history were reviewed and updated as appropriate: allergies, current medications, past family history, past medical history, past social history, past surgical history and problem list. Problem list updated.  Objective:   Vitals:   09/06/16 1424  BP: 105/70  Pulse: (!) 101  Weight: 191 lb 12.8 oz (87 kg)    Fetal Status: Fetal Heart Rate (bpm): 150   Movement: Absent     General:  Alert, oriented and cooperative. Patient is in no acute distress.  Skin: Skin is warm and dry. No rash noted.   Cardiovascular: Normal heart rate noted  Respiratory: Normal respiratory effort, no problems with respiration noted  Abdomen: Soft, gravid, appropriate for gestational age. Pain/Pressure: Absent     Pelvic:  Cervical exam deferred        Extremities: Normal range of motion.  Edema: None  Mental Status: Normal mood and affect. Normal behavior. Normal judgment and thought content.   Urinalysis:      Assessment and Plan:  Pregnancy: G3P1011 at [redacted]w[redacted]d  1. Supervision of other normal pregnancy, antepartum Rx: - AFP, Quad Screen - [place] MFM OB COMP + 14 WK; Future  Preterm labor symptoms and general obstetric precautions including but not limited to vaginal bleeding, contractions, leaking of fluid and fetal movement were reviewed in detail with the patient. Please refer to After Visit Summary for other counseling recommendations.  No Follow-up on file.   Shelly Bombard, MDPatient  ID: [name], female   DOB: October 01, 1995, 21 y.o.   MRN: [id number]

## 2016-09-14 LAB — AFP, QUAD SCREEN
DIA Mom Value: 0.46
DIA Value (EIA): 72.82 pg/mL
DSR (By Age)    1 IN: 1106
DSR (SECOND TRIMESTER) 1 IN: 7083
GESTATIONAL AGE AFP: 15.9 wk
MSAFP MOM: 0.78
MSAFP: 23.7 ng/mL
MSHCG Mom: 0.88
MSHCG: 30573 m[IU]/mL
Maternal Age At EDD: 22.9 YEARS
OSB RISK: 10000
Test Results:: NEGATIVE
UE3 MOM: 0.68
UE3 VALUE: 0.5 ng/mL
Weight: 191 [lb_av]

## 2016-09-22 ENCOUNTER — Encounter (HOSPITAL_COMMUNITY): Payer: Self-pay | Admitting: Obstetrics

## 2016-09-28 ENCOUNTER — Ambulatory Visit (HOSPITAL_COMMUNITY)
Admission: RE | Admit: 2016-09-28 | Discharge: 2016-09-28 | Disposition: A | Discharge status: Home / Self Care | Payer: Medicaid Other | Source: Ambulatory Visit | Attending: Obstetrics | Admitting: Obstetrics

## 2016-09-28 DIAGNOSIS — Z3A19 19 weeks gestation of pregnancy: Secondary | ICD-10-CM | POA: Diagnosis not present

## 2016-09-28 DIAGNOSIS — Z3480 Encounter for supervision of other normal pregnancy, unspecified trimester: Secondary | ICD-10-CM

## 2016-09-28 DIAGNOSIS — Z363 Encounter for antenatal screening for malformations: Secondary | ICD-10-CM | POA: Insufficient documentation

## 2016-10-04 ENCOUNTER — Encounter: Payer: Medicaid Other | Admitting: Obstetrics

## 2016-10-11 ENCOUNTER — Ambulatory Visit (INDEPENDENT_AMBULATORY_CARE_PROVIDER_SITE_OTHER): Payer: Medicaid Other | Admitting: Obstetrics

## 2016-10-11 VITALS — BP 107/70 | HR 97 | Wt 201.0 lb

## 2016-10-11 DIAGNOSIS — Z3490 Encounter for supervision of normal pregnancy, unspecified, unspecified trimester: Secondary | ICD-10-CM

## 2016-10-11 DIAGNOSIS — Z3482 Encounter for supervision of other normal pregnancy, second trimester: Secondary | ICD-10-CM

## 2016-10-11 DIAGNOSIS — Z3480 Encounter for supervision of other normal pregnancy, unspecified trimester: Secondary | ICD-10-CM

## 2016-10-11 NOTE — Progress Notes (Signed)
Pt presents for ROB. Fell down the stairs on Monday, landing on her side and knees c/o DEC FM since the fall. No abnormal pain/cramping or VB per pt.

## 2016-10-12 ENCOUNTER — Encounter: Payer: Self-pay | Admitting: Obstetrics

## 2016-10-12 NOTE — Progress Notes (Signed)
Subjective:  Erin Jacobs is a 21 y.o. G3P1011 at [redacted]w[redacted]d being seen today for ongoing prenatal care.  She is currently monitored for the following issues for this low-risk pregnancy and has Supervision of normal first pregnancy; Post-dates pregnancy; Normal delivery; Anemia, postpartum; and Supervision of normal pregnancy, antepartum on her problem list.  Patient reports no complaints.  Contractions: Not present. Vag. Bleeding: None.  Movement: Absent. Denies leaking of fluid.   The following portions of the patient's history were reviewed and updated as appropriate: allergies, current medications, past family history, past medical history, past social history, past surgical history and problem list. Problem list updated.  Objective:   Vitals:   10/11/16 1621  BP: 107/70  Pulse: 97  Weight: 201 lb (91.2 kg)    Fetal Status: Fetal Heart Rate (bpm): 150   Movement: Absent     General:  Alert, oriented and cooperative. Patient is in no acute distress.  Skin: Skin is warm and dry. No rash noted.   Cardiovascular: Normal heart rate noted  Respiratory: Normal respiratory effort, no problems with respiration noted  Abdomen: Soft, gravid, appropriate for gestational age. Pain/Pressure: Absent     Pelvic:  Cervical exam deferred        Extremities: Normal range of motion.  Edema: None  Mental Status: Normal mood and affect. Normal behavior. Normal judgment and thought content.   Urinalysis:      Assessment and Plan:  Pregnancy: G3P1011 at [redacted]w[redacted]d  1. Encounter for supervision of normal pregnancy, antepartum, unspecified gravidity   Preterm labor symptoms and general obstetric precautions including but not limited to vaginal bleeding, contractions, leaking of fluid and fetal movement were reviewed in detail with the patient. Please refer to After Visit Summary for other counseling recommendations.  No Follow-up on file.   Shelly Bombard, MDPatient ID: Erin Jacobs, female   DOB:  [birth year], 21 y.o.   MRN: [id number]

## 2016-10-29 ENCOUNTER — Inpatient Hospital Stay (HOSPITAL_COMMUNITY)
Admission: AD | Admit: 2016-10-29 | Discharge: 2016-10-29 | Disposition: A | Discharge status: Home / Self Care | Payer: Medicaid Other | Source: Ambulatory Visit | Attending: Obstetrics & Gynecology | Admitting: Obstetrics & Gynecology

## 2016-10-29 ENCOUNTER — Encounter (HOSPITAL_COMMUNITY): Payer: Self-pay | Admitting: *Deleted

## 2016-10-29 DIAGNOSIS — O26892 Other specified pregnancy related conditions, second trimester: Secondary | ICD-10-CM | POA: Insufficient documentation

## 2016-10-29 DIAGNOSIS — K0889 Other specified disorders of teeth and supporting structures: Secondary | ICD-10-CM

## 2016-10-29 DIAGNOSIS — O9989 Other specified diseases and conditions complicating pregnancy, childbirth and the puerperium: Secondary | ICD-10-CM | POA: Diagnosis not present

## 2016-10-29 DIAGNOSIS — Z3A23 23 weeks gestation of pregnancy: Secondary | ICD-10-CM | POA: Diagnosis not present

## 2016-10-29 DIAGNOSIS — Z3490 Encounter for supervision of normal pregnancy, unspecified, unspecified trimester: Secondary | ICD-10-CM

## 2016-10-29 DIAGNOSIS — K029 Dental caries, unspecified: Secondary | ICD-10-CM | POA: Insufficient documentation

## 2016-10-29 MED ORDER — TRAMADOL HCL 50 MG PO TABS: 50.0000 mg | ORAL_TABLET | Freq: Four times a day (QID) | ORAL | 0 refills | Status: DC | PRN

## 2016-10-29 MED ORDER — OXYCODONE-ACETAMINOPHEN 5-325 MG PO TABS
1.0000 | ORAL_TABLET | Freq: Once | ORAL | Status: AC
  Administered 2016-10-29: 1 via ORAL
  Filled 2016-10-29: qty 1

## 2016-10-29 MED ORDER — AMOXICILLIN 500 MG PO CAPS: 500.0000 mg | ORAL_CAPSULE | Freq: Three times a day (TID) | ORAL | 2 refills | Status: DC

## 2016-10-29 NOTE — MAU Provider Note (Signed)
Chief Complaint:  Dental Pain   First Provider Initiated Contact with Patient 10/29/16 1106     HPI: Erin Jacobs is a 21 y.o. G3P1011 at 41w3dwho presents to maternity admissions reporting severe pain in tooth on lower back of mouth.  Has been hurting for 3 days. Did not think she was allowed to have dental work while pregnant.  No fever but does hurt to swallow.. She reports good fetal movement, denies LOF, vaginal bleeding, vaginal itching/burning, urinary symptoms, h/a, dizziness, n/v, diarrhea, constipation or fever/chills.    Dental Pain   This is a new problem. The current episode started in the past 7 days. The problem occurs constantly. The problem has been gradually worsening. The pain is severe. Associated symptoms include difficulty swallowing and facial pain. Pertinent negatives include no fever, oral bleeding or sinus pressure. She has tried acetaminophen for the symptoms. The treatment provided no relief.    RN Note: c/o tooth pain for past 3 days; denies any abdominal pain or vaginal bleeding or vaginal discharge; having problems eating;  Past Medical History: Past Medical History:  Diagnosis Date  . Medical history non-contributory     Past obstetric history: OB History  Gravida Para Term Preterm AB Living  3 1 1  0 1 1  SAB TAB Ectopic Multiple Live Births  1 0 0 0 1    # Outcome Date GA Lbr Len/2nd Weight Sex Delivery Anes PTL Lv  3 Current           2 Term 03/10/14 [redacted]w[redacted]d 10:18 / 00:34 6 lb 6.8 oz (2.914 kg) M Vag-Spont EPI  LIV     Birth Comments: WNL, small abraision on roof of mouth  1 SAB 2014 [redacted]w[redacted]d       DEC      Past Surgical History: Past Surgical History:  Procedure Laterality Date  . leg suregry- childhood    . NO PAST SURGERIES      Family History: Family History  Problem Relation Age of Onset  . Diabetes Maternal Aunt   . Diabetes Maternal Uncle     Social History: Social History  Substance Use Topics  . Smoking status: Never Smoker   . Smokeless tobacco: Never Used  . Alcohol use No    Allergies: No Known Allergies  Meds:  Prescriptions Prior to Admission  Medication Sig Dispense Refill Last Dose  . acetaminophen (TYLENOL) 500 MG tablet Take 1,000 mg by mouth every 6 (six) hours as needed for mild pain, moderate pain or headache.   Taking  . Prenat-Fe Poly-Methfol-FA-DHA (VITAFOL ULTRA) 29-0.6-0.4-200 MG CAPS Take 1 capsule by mouth daily before breakfast. 90 capsule 4 Taking    I have reviewed patient's Past Medical Hx, Surgical Hx, Family Hx, Social Hx, medications and allergies.   ROS:  Review of Systems  Constitutional: Negative for chills and fever.  HENT: Positive for dental problem, facial swelling and trouble swallowing (just pain). Negative for congestion and sinus pressure.   Respiratory: Negative for shortness of breath.   Gastrointestinal: Negative for abdominal pain.  Neurological: Negative for dizziness, speech difficulty and light-headedness.   Other systems negative  Physical Exam   Vitals:   10/29/16 1107 10/29/16 1311  BP: 119/60 115/63  Pulse: 98 80  Resp: 18 16  Temp: 98.9 F (37.2 C)   TempSrc: Oral     Constitutional: Well-developed, well-nourished female in no acute distress.  Mouth:  Left rear molar with decay visible on lingual side, Moderate edema of gum, mild  facial swelling.Tender cervical lymph nodes on left.  Cardiovascular: normal rate and rhythm Respiratory: normal effort, clear to auscultation bilaterally GI: Abd soft, non-tender, gravid appropriate for gestational age.   No rebound or guarding. MS: Extremities nontender, no edema, normal ROM Neurologic: Alert and oriented x 4.  GU: Neg CVAT.  PELVIC EXAM: deferred   FHT:  Baseline 140 , moderate variability, accelerations present, no decelerations Contractions: none   Labs: No results found for this or any previous visit (from the past 24 hour(s)). B/Positive/-- (01/31 1522)  Imaging:  No results  found.  MAU Course/MDM: I have not ordered labs  NST reviewed   Treatments in MAU included Percocet x 1 tab with excellent relief of pain Discussed dental work is safe in pregnancy, Provided letter Discussed not treating decay can contribute to PTL Will give antibiotic to combat infection, Tramadol for pain, may take occasional ibuprofen before 28 wks.    Assessment: Single IUP at [redacted]w[redacted]d Active dental caries - Plan: Discharge patient  Encounter for supervision of normal pregnancy, antepartum, unspecified gravidity  Pain in tooth - Plan: Discharge patient   Plan: Discharge home Rx amoxicillin for dental infection Rx Tramadol for pain Preterm Labor precautions and fetal kick counts Follow up in Office for prenatal visits and recheck of status  Encouraged to return here or to other Urgent Care/ED if she develops worsening of symptoms, increase in pain, fever, or other concerning symptoms.   Pt stable at time of discharge.  Hansel Feinstein CNM, MSN Certified Nurse-Midwife 10/29/2016 11:06 AM

## 2016-10-29 NOTE — MAU Note (Signed)
c/o tooth pain for past 3 days; denies any abdominal pain or vaginal bleeding or vaginal discharge; having problems eating;

## 2016-10-29 NOTE — Discharge Instructions (Signed)
Dental Caries, Adult Dental caries are spots of decay (cavities) in the outer layer of your tooth (enamel). The natural bacteria in your mouth produce acid when breaking down sugary foods and drinks. When you eat or drink a lot of sugary foods and liquids, a lot of acid is produced. The acid destroys the protective enamel of your tooth, leading to tooth decay. It is important to treat your tooth decay as soon as possible. Untreated dental caries can spread decay and lead to painful infection. Brushing regularly with fluoride toothpaste (oral hygiene) and getting regular dental checkups can help prevent dental caries. What are the causes? Dental caries are caused by the acid that is produced when bacteria break down sugary or acidic foods and drinks. What increases the risk? This condition is more likely to develop in young adults. This condition is also more likely to develop in people who:  Drink a lot of sugary liquids, including alcoholic drinks, such as champagne.  Eat a lot of sweets and carbohydrates.  Drink water that is not treated with fluoride.  Have poor oral hygiene.  Have deep grooves in their teeth.  Take certain medicines that decrease saliva. What are the signs or symptoms? Symptoms of dental caries include:  White, brown, or black spots on the teeth.  Pain.  Swollen or bleeding gums. How is this diagnosed? Your dentist may suspect dental caries from your signs and symptoms. The dentist will also do an oral exam. This may include X-rays to confirm the diagnosis. Sometimes lights, a thin probe, and dyes are used to find dental caries (using electrical conductivity or using laser reflection). How is this treated? Treatment for dental caries usually involves a procedure to remove the decay and restore the tooth with a filling or a sealant. Follow these instructions at home:  Practice good oral hygiene. This keeps your mouth and gums healthy. Use fluoride toothpaste to  brush your teeth twice a day, and floss once a day.  If your dentist prescribed an antibiotic medicine to treat an infection, take it as told by your dentist. Do not stop taking the antibiotic even if your condition improves.  Keep all follow-up visits as told by your dentist. This is important. This includes all cleanings. How is this prevented? To prevent dental caries:  Brush your teeth every morning and night with fluoride toothpaste.  Get regular dental cleanings.  If you are at risk of dental caries, wash your mouth with prescription mouthwash (chlorhexidine) and apply topical fluoride to your teeth.  Drink fluoridated water regularly.  Drink water instead of sugary drinks.  Eat healthy meals and snacks. Contact a health care provider if:  You have symptoms of tooth decay. This information is not intended to replace advice given to you by your health care provider. Make sure you discuss any questions you have with your health care provider. Document Released: 03/25/2002 Document Revised: 03/01/2016 Document Reviewed: 01/14/2016 Elsevier Interactive Patient Education  2017 [employer].

## 2016-11-08 ENCOUNTER — Encounter: Payer: Medicaid Other | Admitting: Obstetrics

## 2016-11-18 ENCOUNTER — Encounter (HOSPITAL_COMMUNITY): Payer: Self-pay | Admitting: *Deleted

## 2016-11-18 ENCOUNTER — Inpatient Hospital Stay (HOSPITAL_COMMUNITY)
Admission: AD | Admit: 2016-11-18 | Discharge: 2016-11-18 | Disposition: A | Discharge status: Home / Self Care | Payer: Medicaid Other | Source: Ambulatory Visit | Attending: Obstetrics & Gynecology | Admitting: Obstetrics & Gynecology

## 2016-11-18 DIAGNOSIS — O9989 Other specified diseases and conditions complicating pregnancy, childbirth and the puerperium: Secondary | ICD-10-CM | POA: Diagnosis not present

## 2016-11-18 DIAGNOSIS — Z3A26 26 weeks gestation of pregnancy: Secondary | ICD-10-CM | POA: Diagnosis not present

## 2016-11-18 DIAGNOSIS — B373 Candidiasis of vulva and vagina: Secondary | ICD-10-CM | POA: Diagnosis not present

## 2016-11-18 DIAGNOSIS — O98812 Other maternal infectious and parasitic diseases complicating pregnancy, second trimester: Secondary | ICD-10-CM | POA: Diagnosis not present

## 2016-11-18 DIAGNOSIS — B3731 Acute candidiasis of vulva and vagina: Secondary | ICD-10-CM

## 2016-11-18 MED ORDER — TERCONAZOLE 0.4 % VA CREA: 1.0000 | TOPICAL_CREAM | Freq: Every day | VAGINAL | 0 refills | Status: DC

## 2016-11-18 NOTE — MAU Note (Signed)
Pt states she started having vaginal itching on Wednesday night, began having more itching & burning on Thursday, is worse today.  Denies discharge or dysuria.  No contractions, bleeding or LOF.

## 2016-11-18 NOTE — Discharge Instructions (Signed)

## 2016-11-18 NOTE — MAU Provider Note (Signed)
  History     CSN: [id number]  Arrival date and time: 11/18/16 1220   First Provider Initiated Contact with Patient 11/18/16 1300      Chief Complaint  Patient presents with  . vaginal irritation   HPI Ms. Erin Jacobs is a 21 y.o. G3P1011 at [redacted]w[redacted]d who presents to MAU today with complaint of vaginal irritation, itching and burning since Thursday. She tried Vasolin without relief. She denies bleeding, LOF, vaginal discharge, abdominal pain or contractions. She states some redness and rash on the labia. She reports good fetal movement.   OB History    Gravida Para Term Preterm AB Living   3 1 1  0 1 1   SAB TAB Ectopic Multiple Live Births   1 0 0 0 1      Past Medical History:  Diagnosis Date  . Medical history non-contributory     Past Surgical History:  Procedure Laterality Date  . leg suregry- childhood      Family History  Problem Relation Age of Onset  . Diabetes Maternal Aunt   . Diabetes Maternal Uncle     Social History  Substance Use Topics  . Smoking status: Never Smoker  . Smokeless tobacco: Never Used  . Alcohol use No    Allergies: No Known Allergies  No prescriptions prior to admission.    Review of Systems  Gastrointestinal: Negative for abdominal pain.  Genitourinary: Negative for dysuria, frequency, urgency, vaginal bleeding and vaginal discharge.   Physical Exam   Blood pressure 107/66, pulse 89, temperature 97.3 F (36.3 C), temperature source Oral, resp. rate 18, last menstrual period 05/18/2016, unknown if currently breastfeeding.  Physical Exam  Nursing note and vitals reviewed. Constitutional: She is oriented to person, place, and time. She appears well-developed and well-nourished. No distress.  HENT:  Head: Normocephalic and atraumatic.  Cardiovascular: Normal rate.   Respiratory: Effort normal.  GI: Soft. She exhibits no distension and no mass. There is no tenderness. There is no rebound and no guarding.  Genitourinary:  Uterus is enlarged (gravid). Uterus is not tender. Cervix exhibits no friability. No bleeding in the vagina. Vaginal discharge (copious amount of thick, white discharge coating the vaginal walls) found.  Neurological: She is alert and oriented to person, place, and time.  Skin: Skin is warm and dry. No erythema.  Psychiatric: She has a normal mood and affect.    Fetal Monitoring: Baseline: 130 bpm Variability: moderate Accelerations: 10 x 10  Decelerations: none Contractions: none  MAU Course  Procedures None  MDM Clinical picture of yeast vulvovaginitis noted on exam.   Assessment and Plan  A: [facility]UP at [redacted]w[redacted]d Yeast vulvovaginitis   P:  Discharge home Rx for Terazol 7 sent to patient's pharmacy  Preterm labor  precautions discussed Patient advised to follow-up with [facility] as scheduled for routine prenatal care or sooner PRN Patient may return to MAU as needed or if her condition were to change or worsen   Kerry Hough, PA-C 11/18/2016, 1:33 PM

## 2016-11-29 ENCOUNTER — Ambulatory Visit (INDEPENDENT_AMBULATORY_CARE_PROVIDER_SITE_OTHER): Payer: Medicaid Other | Admitting: Obstetrics

## 2016-11-29 ENCOUNTER — Encounter: Payer: Self-pay | Admitting: Obstetrics

## 2016-11-29 DIAGNOSIS — Z3492 Encounter for supervision of normal pregnancy, unspecified, second trimester: Secondary | ICD-10-CM

## 2016-11-29 DIAGNOSIS — Z3490 Encounter for supervision of normal pregnancy, unspecified, unspecified trimester: Secondary | ICD-10-CM

## 2016-11-29 NOTE — Progress Notes (Signed)
Subjective:  Erin Jacobs is a 21 y.o. G3P1011 at 106w6d being seen today for ongoing prenatal care.  She is currently monitored for the following issues for this low-risk pregnancy and has Supervision of normal first pregnancy; Post-dates pregnancy; Normal delivery; Anemia, postpartum; and Supervision of normal pregnancy, antepartum on her problem list.  Patient reports no complaints.  Contractions: Not present. Vag. Bleeding: None.  Movement: Present. Denies leaking of fluid.   The following portions of the patient's history were reviewed and updated as appropriate: allergies, current medications, past family history, past medical history, past social history, past surgical history and problem list. Problem list updated.  Objective:   Vitals:   11/29/16 1614  BP: 128/67  Pulse: (!) 101  Weight: 209 lb (94.8 kg)    Fetal Status: Fetal Heart Rate (bpm): 150   Movement: Present     General:  Alert, oriented and cooperative. Patient is in no acute distress.  Skin: Skin is warm and dry. No rash noted.   Cardiovascular: Normal heart rate noted  Respiratory: Normal respiratory effort, no problems with respiration noted  Abdomen: Soft, gravid, appropriate for gestational age. Pain/Pressure: Absent     Pelvic:  Cervical exam deferred        Extremities: Normal range of motion.  Edema: Trace  Mental Status: Normal mood and affect. Normal behavior. Normal judgment and thought content.   Urinalysis:      Assessment and Plan:  Pregnancy: G3P1011 at [redacted]w[redacted]d  1. Encounter for supervision of normal pregnancy, antepartum, unspecified gravidity   Preterm labor symptoms and general obstetric precautions including but not limited to vaginal bleeding, contractions, leaking of fluid and fetal movement were reviewed in detail with the patient. Please refer to After Visit Summary for other counseling recommendations.  Return in 2 weeks (on 12/13/2016) for ROB.   Shelly Bombard, MD  Patient ID:  Erin Jacobs, female   DOB: 03/26/1996, 21 y.o.   MRN: [id number]

## 2016-11-30 ENCOUNTER — Other Ambulatory Visit: Payer: Medicaid Other

## 2016-11-30 DIAGNOSIS — Z3403 Encounter for supervision of normal first pregnancy, third trimester: Secondary | ICD-10-CM

## 2016-12-01 LAB — CBC
Hematocrit: 31.5 % — ABNORMAL LOW (ref 34.0–46.6)
Hemoglobin: 10 g/dL — ABNORMAL LOW (ref 11.1–15.9)
MCH: 25.3 pg — ABNORMAL LOW (ref 26.6–33.0)
MCHC: 31.7 g/dL (ref 31.5–35.7)
MCV: 80 fL (ref 79–97)
Platelets: 332 10*3/uL (ref 150–379)
RBC: 3.96 x10E6/uL (ref 3.77–5.28)
RDW: 14.9 % (ref 12.3–15.4)
WBC: 9.2 10*3/uL (ref 3.4–10.8)

## 2016-12-01 LAB — GLUCOSE TOLERANCE, 2 HOURS W/ 1HR
GLUCOSE, FASTING: 71 mg/dL (ref 65–91)
Glucose, 1 hour: 99 mg/dL (ref 65–179)
Glucose, 2 hour: 90 mg/dL (ref 65–152)

## 2016-12-01 LAB — HIV ANTIBODY (ROUTINE TESTING W REFLEX): HIV SCREEN 4TH GENERATION: NONREACTIVE

## 2016-12-01 LAB — RPR: RPR: NONREACTIVE

## 2016-12-08 ENCOUNTER — Other Ambulatory Visit: Payer: Self-pay | Admitting: Obstetrics

## 2016-12-08 ENCOUNTER — Telehealth: Payer: Self-pay | Admitting: *Deleted

## 2016-12-08 NOTE — Telephone Encounter (Signed)
Patient states she may have lost her mucus plug- she was in the shower last night and she had a mucus,clear discharge with white in it come out. She has not had any other symptoms when asked: no other discharge that she considers not normal( no watery discharge), no contractions, not increase in pressure other than when her baby is moving.Patient next appointment is Wednesday. In the absence of any other symptoms- patient advised to abstain form intercourse- monitor for discharge change, contractions, or increase in pressure- if she should have any of these symptoms before her appointment- go to MAU for evaluation. Otherwise we will check her at her appointment just to make sure she is stable.

## 2016-12-13 ENCOUNTER — Ambulatory Visit (INDEPENDENT_AMBULATORY_CARE_PROVIDER_SITE_OTHER): Payer: Medicaid Other | Admitting: Obstetrics

## 2016-12-13 ENCOUNTER — Encounter: Payer: Self-pay | Admitting: Obstetrics

## 2016-12-13 DIAGNOSIS — Z3490 Encounter for supervision of normal pregnancy, unspecified, unspecified trimester: Secondary | ICD-10-CM

## 2016-12-13 DIAGNOSIS — Z3483 Encounter for supervision of other normal pregnancy, third trimester: Secondary | ICD-10-CM

## 2016-12-13 NOTE — Progress Notes (Signed)
Subjective:  Erin Jacobs is a 21 y.o. G3P1011 at [redacted]w[redacted]d being seen today for ongoing prenatal care.  She is currently monitored for the following issues for this low-risk pregnancy and has Supervision of normal first pregnancy; Post-dates pregnancy; Normal delivery; Anemia, postpartum; and Supervision of normal pregnancy, antepartum on her problem list.  Patient reports no complaints.  Contractions: Not present. Vag. Bleeding: None.  Movement: Present. Denies leaking of fluid.   The following portions of the patient's history were reviewed and updated as appropriate: allergies, current medications, past family history, past medical history, past social history, past surgical history and problem list. Problem list updated.  Objective:   Vitals:   12/13/16 1525  BP: 127/73  Pulse: 99  Weight: 211 lb 3.2 oz (95.8 kg)    Fetal Status: Fetal Heart Rate (bpm): 150   Movement: Present     General:  Alert, oriented and cooperative. Patient is in no acute distress.  Skin: Skin is warm and dry. No rash noted.   Cardiovascular: Normal heart rate noted  Respiratory: Normal respiratory effort, no problems with respiration noted  Abdomen: Soft, gravid, appropriate for gestational age. Pain/Pressure: Present     Pelvic:  Cervical exam deferred        Extremities: Normal range of motion.  Edema: Trace  Mental Status: Normal mood and affect. Normal behavior. Normal judgment and thought content.   Urinalysis:      Assessment and Plan:  Pregnancy: G3P1011 at [redacted]w[redacted]d  1. Encounter for supervision of normal pregnancy, antepartum, unspecified gravidity   Preterm labor symptoms and general obstetric precautions including but not limited to vaginal bleeding, contractions, leaking of fluid and fetal movement were reviewed in detail with the patient. Please refer to After Visit Summary for other counseling recommendations.  Return in 2 weeks (on 12/27/2016) for ROB.   Shelly Bombard, MDPatient ID:  Erin Jacobs, female   DOB: [birth year], 21 y.o.   MRN: [id number]

## 2016-12-13 NOTE — Progress Notes (Signed)
Patient reports good fetal movement and occasional pressure, denies contractions and bleeding.

## 2016-12-27 ENCOUNTER — Encounter: Payer: Medicaid Other | Admitting: Obstetrics

## 2017-01-08 ENCOUNTER — Encounter: Payer: Self-pay | Admitting: Obstetrics

## 2017-01-08 ENCOUNTER — Ambulatory Visit (INDEPENDENT_AMBULATORY_CARE_PROVIDER_SITE_OTHER): Payer: Medicaid Other | Admitting: Obstetrics

## 2017-01-08 VITALS — BP 115/73 | HR 110 | Wt 213.0 lb

## 2017-01-08 DIAGNOSIS — Z3490 Encounter for supervision of normal pregnancy, unspecified, unspecified trimester: Secondary | ICD-10-CM

## 2017-01-08 DIAGNOSIS — Z3483 Encounter for supervision of other normal pregnancy, third trimester: Secondary | ICD-10-CM

## 2017-01-08 NOTE — Progress Notes (Signed)
Pt states that she is having increase in pelvic pain. Pt states that pain is increase when up with activity, walking and some while sleeping.

## 2017-01-08 NOTE — Progress Notes (Signed)
Subjective:  Erin Jacobs is a 21 y.o. G3P1011 at [redacted]w[redacted]d being seen today for ongoing prenatal care.  She is currently monitored for the following issues for this low-risk pregnancy and has Supervision of normal first pregnancy; Post-dates pregnancy; Normal delivery; Anemia, postpartum; and Supervision of normal pregnancy, antepartum on her problem list.  Patient reports no complaints.  Contractions: Not present. Vag. Bleeding: None.  Movement: Present. Denies leaking of fluid.   The following portions of the patient's history were reviewed and updated as appropriate: allergies, current medications, past family history, past medical history, past social history, past surgical history and problem list. Problem list updated.  Objective:   Vitals:   01/08/17 1406  BP: 115/73  Pulse: (!) 110  Weight: 213 lb (96.6 kg)    Fetal Status:     Movement: Present     General:  Alert, oriented and cooperative. Patient is in no acute distress.  Skin: Skin is warm and dry. No rash noted.   Cardiovascular: Normal heart rate noted  Respiratory: Normal respiratory effort, no problems with respiration noted  Abdomen: Soft, gravid, appropriate for gestational age. Pain/Pressure: Present     Pelvic:  Cervical exam deferred        Extremities: Normal range of motion.     Mental Status: Normal mood and affect. Normal behavior. Normal judgment and thought content.   Urinalysis:      Assessment and Plan:  Pregnancy: G3P1011 at [redacted]w[redacted]d  There are no diagnoses linked to this encounter. Preterm labor symptoms and general obstetric precautions including but not limited to vaginal bleeding, contractions, leaking of fluid and fetal movement were reviewed in detail with the patient. Please refer to After Visit Summary for other counseling recommendations.  No Follow-up on file.   Shelly Bombard, MDPatient ID: Erin Jacobs, female   DOB: 31-Aug-1995, 21 y.o.   MRN: [id number]

## 2017-01-12 ENCOUNTER — Encounter (HOSPITAL_COMMUNITY): Payer: Self-pay | Admitting: *Deleted

## 2017-01-12 ENCOUNTER — Inpatient Hospital Stay (HOSPITAL_COMMUNITY)
Admission: AD | Admit: 2017-01-12 | Discharge: 2017-01-12 | Disposition: A | Discharge status: Home / Self Care | Payer: Medicaid Other | Source: Ambulatory Visit | Attending: Obstetrics & Gynecology | Admitting: Obstetrics & Gynecology

## 2017-01-12 DIAGNOSIS — Z9889 Other specified postprocedural states: Secondary | ICD-10-CM | POA: Insufficient documentation

## 2017-01-12 DIAGNOSIS — Z8759 Personal history of other complications of pregnancy, childbirth and the puerperium: Secondary | ICD-10-CM | POA: Diagnosis not present

## 2017-01-12 DIAGNOSIS — R109 Unspecified abdominal pain: Secondary | ICD-10-CM | POA: Diagnosis present

## 2017-01-12 DIAGNOSIS — Z3A34 34 weeks gestation of pregnancy: Secondary | ICD-10-CM | POA: Insufficient documentation

## 2017-01-12 DIAGNOSIS — O4703 False labor before 37 completed weeks of gestation, third trimester: Secondary | ICD-10-CM

## 2017-01-12 DIAGNOSIS — Z833 Family history of diabetes mellitus: Secondary | ICD-10-CM | POA: Insufficient documentation

## 2017-01-12 LAB — URINALYSIS, ROUTINE W REFLEX MICROSCOPIC
BILIRUBIN URINE: NEGATIVE
Glucose, UA: NEGATIVE mg/dL
Hgb urine dipstick: NEGATIVE
KETONES UR: NEGATIVE mg/dL
Nitrite: NEGATIVE
Protein, ur: NEGATIVE mg/dL
SPECIFIC GRAVITY, URINE: 1.016 (ref 1.005–1.030)
pH: 7 (ref 5.0–8.0)

## 2017-01-12 MED ORDER — NIFEDIPINE ER OSMOTIC RELEASE 30 MG PO TB24: 30.0000 mg | ORAL_TABLET | Freq: Every day | ORAL | 2 refills | Status: DC

## 2017-01-12 MED ORDER — NIFEDIPINE 10 MG PO CAPS
10.0000 mg | ORAL_CAPSULE | ORAL | Status: AC
  Administered 2017-01-12: 10 mg via ORAL
  Filled 2017-01-12: qty 1

## 2017-01-12 MED ORDER — NIFEDIPINE 10 MG PO CAPS: 10.0000 mg | ORAL_CAPSULE | Freq: Once | ORAL | Status: DC

## 2017-01-12 NOTE — Discharge Instructions (Signed)
Preventing Preterm Birth °Preterm birth is when your baby is delivered between 20 weeks and 37 weeks of pregnancy. A full-term pregnancy lasts for at least 37 weeks. Preterm birth can be dangerous for your baby because the last few weeks of pregnancy are an important time for your baby's brain and lungs to grow. Many things can cause a baby to be born early. Sometimes the cause is not known. There are certain factors that make you more likely to experience preterm birth, such as: °· Having a previous baby born preterm. °· Being pregnant with twins or other multiples. °· Having had fertility treatment. °· Being overweight or underweight at the start of your pregnancy. °· Having any of the following during pregnancy: °? An infection, including a urinary tract infection (UTI) or an STI (sexually transmitted infection). °? High blood pressure. °? Diabetes. °? Vaginal bleeding. °· Being age 35 or older. °· Being age 18 or younger. °· Getting pregnant within 6 months of a previous pregnancy. °· Suffering extreme stress or physical or emotional abuse during pregnancy. °· Standing for long periods of time during pregnancy, such as working at a job that requires standing. ° °What are the risks? °The most serious risk of preterm birth is that the baby may not survive. This is more likely to happen if a baby is born before 34 weeks. Other risks and complications of preterm birth may include your baby having: °· Breathing problems. °· Brain damage that affects movement and coordination (cerebral palsy). °· Feeding difficulties. °· Vision or hearing problems. °· Infections or inflammation of the digestive tract (colitis). °· Developmental delays. °· Learning disabilities. °· Higher risk for diabetes, heart disease, and high blood pressure later in life. ° °What can I do to lower my risk? °Medical care ° °The most important thing you can do to lower your risk for preterm birth is to get routine medical care during pregnancy  (prenatal care). If you have a high risk of preterm birth, you may be referred to a health care provider who specializes in managing high-risk pregnancies (perinatologist). You may be given medicine to help prevent preterm birth. °Lifestyle changes °Certain lifestyle changes can also lower your risk of preterm birth: °· Wait at least 6 months after a pregnancy to become pregnant again. °· Try to plan pregnancy for when you are between 19 and 35 years old. °· Get to a healthy weight before getting pregnant. If you are overweight, work with your health care provider to safely lose weight. °· Do not use any products that contain nicotine or tobacco, such as cigarettes and e-cigarettes. If you need help quitting, ask your health care provider. °· Do not drink alcohol. °· Do not use drugs. ° °Where to find support: °For more support, consider: °· Talking with your health care provider. °· Talking with a therapist or substance abuse counselor, if you need help quitting. °· Working with a diet and nutrition specialist (dietitian) or a personal trainer to maintain a healthy weight. °· Joining a support group. ° °Where to find more information: °Learn more about preventing preterm birth from: °· Centers for Disease Control and Prevention: cdc.gov/reproductivehealth/maternalinfanthealth/pretermbirth.htm °· March of Dimes: marchofdimes.org/complications/premature-babies.aspx °· American Pregnancy Association: americanpregnancy.org/labor-and-birth/premature-labor ° °Contact a health care provider if: °· You have any of the following signs of preterm labor before 37 weeks: °? A change or increase in vaginal discharge. °? Fluid leaking from your vagina. °? Pressure or cramps in your lower abdomen. °? A backache that does not   go away or gets worse. °? Regular tightening (contractions) in your lower abdomen. °Summary °· Preterm birth means having your baby during weeks 20-37 of pregnancy. °· Preterm birth may put your baby at risk  for physical and mental problems. °· Getting good prenatal care can help prevent preterm birth. °· You can lower your risk of preterm birth by making certain lifestyle changes, such as not smoking and not using alcohol. °This information is not intended to replace advice given to you by your health care provider. Make sure you discuss any questions you have with your health care provider. °Document Released: 08/17/2015 Document Revised: 03/11/2016 Document Reviewed: 03/11/2016 °Elsevier Interactive Patient Education © 2018 [employer] ° °

## 2017-01-12 NOTE — MAU Note (Signed)
Pt presents to MAU with complaints of constant, sharp shooting pains in her lower right abdomen radiating into her leg since last night. Denies any VB or abnormal discharge

## 2017-01-12 NOTE — MAU Provider Note (Signed)
Obstetric Attending MAU Note  Chief Complaint:  Abdominal Cramping   First Provider Initiated Contact with Patient 01/12/17 1237     HPI: Erin Jacobs is a 21 y.o. G3P1011 at [redacted]w[redacted]d who presents to maternity admissions reporting irregular preterm contractions since last night. Denies  leakage of fluid or vaginal bleeding. Good fetal movement.   Pregnancy Course: Receives care at [facility] Patient Active Problem List   Diagnosis Date Noted  . Supervision of normal pregnancy, antepartum 08/16/2016  . Anemia, postpartum 03/12/2014  . Normal delivery 03/11/2014  . Post-dates pregnancy 03/09/2014  . Supervision of normal first pregnancy 08/15/2013    Past Medical History:  Diagnosis Date  . Medical history non-contributory     OB History  Gravida Para Term Preterm AB Living  3 1 1  0 1 1  SAB TAB Ectopic Multiple Live Births  1 0 0 0 1    # Outcome Date GA Lbr Len/2nd Weight Sex Delivery Anes PTL Lv  3 Current           2 Term 03/10/14 [redacted]w[redacted]d 10:18 / 00:34 6 lb 6.8 oz (2.914 kg) M Vag-Spont EPI  LIV     Birth Comments: WNL, small abraision on roof of mouth  1 SAB 2014 [redacted]w[redacted]d       DEC      Past Surgical History:  Procedure Laterality Date  . leg suregry- childhood      Family History: Family History  Problem Relation Age of Onset  . Diabetes Maternal Aunt   . Diabetes Maternal Uncle     Social History: Social History  Substance Use Topics  . Smoking status: Never Smoker  . Smokeless tobacco: Never Used  . Alcohol use No    Allergies: No Known Allergies  Prescriptions Prior to Admission  Medication Sig Dispense Refill Last Dose  . acetaminophen (TYLENOL) 500 MG tablet Take 1,000 mg by mouth every 6 (six) hours as needed for mild pain, moderate pain or headache.   01/11/2017 at Unknown time  . Prenat-Fe Poly-Methfol-FA-DHA (VITAFOL ULTRA) 29-0.6-0.4-200 MG CAPS Take 1 capsule by mouth daily before breakfast. 90 capsule 4 01/11/2017 at Unknown time  . terconazole  (TERAZOL 7) 0.4 % vaginal cream Place 1 applicator vaginally at bedtime. (Patient not taking: Reported on 11/29/2016) 45 g 0 Not Taking  . traMADol (ULTRAM) 50 MG tablet Take 1 tablet (50 mg total) by mouth every 6 (six) hours as needed. (Patient not taking: Reported on 11/29/2016) 20 tablet 0 Not Taking    ROS: Pertinent findings in history of present illness.  Physical Exam  Blood pressure 116/60, pulse (!) 102, temperature 99 F (37.2 C), resp. rate 18, weight 213 lb (96.6 kg), last menstrual period 05/18/2016, unknown if currently breastfeeding. CONSTITUTIONAL: Well-developed, well-nourished female in no acute distress.  HENT:  Normocephalic, atraumatic, External right and left ear normal. Oropharynx is clear and moist EYES: Conjunctivae and EOM are normal. Pupils are equal, round, and reactive to light. No scleral icterus.  NECK: Normal range of motion, supple, no masses SKIN: Skin is warm and dry. No rash noted. Not diaphoretic. No erythema. No pallor. [place]: Alert and oriented to person, place, and time. Normal reflexes, muscle tone coordination. No cranial nerve deficit noted. PSYCHIATRIC: Normal mood and affect. Normal behavior. Normal judgment and thought content. CARDIOVASCULAR: Normal heart rate noted, regular rhythm RESPIRATORY: Effort and breath sounds normal, no problems with respiration noted ABDOMEN: Soft, nontender, nondistended, gravid appropriate for gestational age MUSCULOSKELETAL: Normal range of motion. No  edema and no tenderness. 2+ distal pulses.  CERVIX: NEFG,  Dilation: Fingertip Effacement (%): Thick Cervical Position: Posterior Station: Ballotable Exam by:: Beverely Risen, MD  FHT:  Baseline 125 , moderate variability, accelerations present, no decelerations Contractions: q 3-4 mins   Labs: Results for orders placed or performed during the hospital encounter of 01/12/17 (from the past 24 hour(s))  Urinalysis, Routine w reflex microscopic     Status:  Abnormal   Collection Time: 01/12/17 12:20 PM  Result Value Ref Range   Color, Urine YELLOW YELLOW   APPearance CLEAR CLEAR   Specific Gravity, Urine 1.016 1.005 - 1.030   pH 7.0 5.0 - 8.0   Glucose, UA NEGATIVE NEGATIVE mg/dL   Hgb urine dipstick NEGATIVE NEGATIVE   Bilirubin Urine NEGATIVE NEGATIVE   Ketones, ur NEGATIVE NEGATIVE mg/dL   Protein, ur NEGATIVE NEGATIVE mg/dL   Nitrite NEGATIVE NEGATIVE   Leukocytes, UA MODERATE (A) NEGATIVE   RBC / HPF 0-5 0 - 5 RBC/hpf   WBC, UA 6-30 0 - 5 WBC/hpf   Bacteria, UA RARE (A) NONE SEEN   Squamous Epithelial / LPF 6-30 (A) NONE SEEN   Mucous PRESENT     Imaging:  No results found.  MAU Course: 1230  Cervical check FT/long/thick, every 3-4 min contractions.  Given po hydration and Procardia IR 10 mg x 1 1319 Contractions spaced out. Sent home.  Assessment: 1. Preterm uterine contractions in third trimester, antepartum     Plan: Discharge home Procardia prescribed as needed for preterm contractions Preterm labor precautions and fetal kick counts reviewed Follow up with OB provider    Allergies as of 01/12/2017   No Known Allergies     Medication List    STOP taking these medications   terconazole 0.4 % vaginal cream Commonly known as:  TERAZOL 7   traMADol 50 MG tablet Commonly known as:  ULTRAM     TAKE these medications   acetaminophen 500 MG tablet Commonly known as:  TYLENOL Take 1,000 mg by mouth every 6 (six) hours as needed for mild pain, moderate pain or headache.   NIFEdipine 30 MG 24 hr tablet Commonly known as:  PROCARDIA-XL/ADALAT-CC/NIFEDICAL-XL Take 1 tablet (30 mg total) by mouth daily. Can increase to twice a day as needed for symptomatic contractions   VITAFOL ULTRA 29-0.6-0.4-200 MG Caps Take 1 capsule by mouth daily before breakfast.       Azyria Osmon, Sallyanne Havers, MD 01/12/2017 1:20 PM

## 2017-01-22 ENCOUNTER — Ambulatory Visit (INDEPENDENT_AMBULATORY_CARE_PROVIDER_SITE_OTHER): Payer: Medicaid Other | Admitting: Obstetrics

## 2017-01-22 ENCOUNTER — Encounter: Payer: Self-pay | Admitting: Obstetrics

## 2017-01-22 VITALS — BP 126/74 | HR 90 | Wt 219.2 lb

## 2017-01-22 DIAGNOSIS — Z3483 Encounter for supervision of other normal pregnancy, third trimester: Secondary | ICD-10-CM

## 2017-01-22 DIAGNOSIS — O4703 False labor before 37 completed weeks of gestation, third trimester: Secondary | ICD-10-CM

## 2017-01-22 DIAGNOSIS — Z23 Encounter for immunization: Secondary | ICD-10-CM

## 2017-01-22 DIAGNOSIS — Z3490 Encounter for supervision of normal pregnancy, unspecified, unspecified trimester: Secondary | ICD-10-CM

## 2017-01-22 NOTE — Progress Notes (Signed)
Subjective:  Erin Jacobs is a 21 y.o. G3P1011 at [redacted]w[redacted]d being seen today for ongoing prenatal care.  She is currently monitored for the following issues for this low-risk pregnancy and has Supervision of normal first pregnancy; Post-dates pregnancy; Normal delivery; Anemia, postpartum; and Supervision of normal pregnancy, antepartum on her problem list.  Patient reports contractions since this morning, ~ 7 minutes apart, mild..  Contractions: Irregular. Vag. Bleeding: None.  Movement: Present. Denies leaking of fluid.   The following portions of the patient's history were reviewed and updated as appropriate: allergies, current medications, past family history, past medical history, past social history, past surgical history and problem list. Problem list updated.  Objective:   Vitals:   01/22/17 1612  BP: 126/74  Pulse: 90  Weight: 219 lb 3.2 oz (99.4 kg)    Fetal Status:     Movement: Present     General:  Alert, oriented and cooperative. Patient is in no acute distress.  Skin: Skin is warm and dry. No rash noted.   Cardiovascular: Normal heart rate noted  Respiratory: Normal respiratory effort, no problems with respiration noted  Abdomen: Soft, gravid, appropriate for gestational age. Pain/Pressure: Present     Pelvic:  Cervical exam performed      Cvx:  Long / FT / Soft / -3 / Vtx   Extremities: Normal range of motion.  Edema: Trace  Mental Status: Normal mood and affect. Normal behavior. Normal judgment and thought content.   Urinalysis:      Assessment and Plan:  Pregnancy: G3P1011 at [redacted]w[redacted]d  1. Encounter for supervision of normal pregnancy, antepartum, unspecified gravidity  2. Preterm UC's - Preterm UC',s.  Noncompliant with Procardia, and she has had frequent sexual intercourse.  She says nobody told her not to have    sex - Encouraged to take Procardia as directed.  Reminded of pelvic rest until after 37 weeks.  Preterm labor symptoms and general obstetric precautions  including but not limited to vaginal bleeding, contractions, leaking of fluid and fetal movement were reviewed in detail with the patient. Please refer to After Visit Summary for other counseling recommendations.  Return in about 1 week (around 01/29/2017) for [place].   Shelly Bombard, MDPatient reports good fetal movement and reports contractions that have been coming and going about every 7 minutes since 1030am, denies bleeding. Patient ID: Erin Jacobs, female   DOB: 1995-10-30, 21 y.o.   MRN: [id number]

## 2017-01-22 NOTE — Addendum Note (Signed)
Addended by: Tristan Schroeder D on: 01/22/2017 05:02 PM   Modules accepted: Orders

## 2017-01-24 LAB — STREP GP B NAA: STREP GROUP B AG: POSITIVE — AB

## 2017-01-29 ENCOUNTER — Encounter: Payer: Medicaid Other | Admitting: Obstetrics

## 2017-02-05 ENCOUNTER — Encounter (HOSPITAL_COMMUNITY): Payer: Self-pay

## 2017-02-05 ENCOUNTER — Inpatient Hospital Stay (HOSPITAL_COMMUNITY)
Admission: AD | Admit: 2017-02-05 | Discharge: 2017-02-05 | Disposition: A | Discharge status: Home / Self Care | Payer: Medicaid Other | Source: Ambulatory Visit | Attending: Family Medicine | Admitting: Family Medicine

## 2017-02-05 ENCOUNTER — Encounter: Payer: Self-pay | Admitting: Obstetrics

## 2017-02-05 ENCOUNTER — Ambulatory Visit (INDEPENDENT_AMBULATORY_CARE_PROVIDER_SITE_OTHER): Payer: Medicaid Other | Admitting: Obstetrics

## 2017-02-05 VITALS — BP 111/67 | HR 103 | Wt 221.0 lb

## 2017-02-05 DIAGNOSIS — Z3A37 37 weeks gestation of pregnancy: Secondary | ICD-10-CM | POA: Insufficient documentation

## 2017-02-05 DIAGNOSIS — Z3490 Encounter for supervision of normal pregnancy, unspecified, unspecified trimester: Secondary | ICD-10-CM

## 2017-02-05 DIAGNOSIS — Z3493 Encounter for supervision of normal pregnancy, unspecified, third trimester: Secondary | ICD-10-CM

## 2017-02-05 DIAGNOSIS — O26893 Other specified pregnancy related conditions, third trimester: Secondary | ICD-10-CM | POA: Diagnosis present

## 2017-02-05 DIAGNOSIS — N898 Other specified noninflammatory disorders of vagina: Secondary | ICD-10-CM

## 2017-02-05 LAB — AMNISURE RUPTURE OF MEMBRANE (ROM) NOT AT ARMC: AMNISURE: NEGATIVE

## 2017-02-05 LAB — POCT FERN TEST: POCT Fern Test: NEGATIVE

## 2017-02-05 NOTE — Progress Notes (Signed)
Subjective:  Erin Jacobs is a 21 y.o. G3P1011 at [redacted]w[redacted]d being seen today for ongoing prenatal care.  She is currently monitored for the following issues for this low-risk pregnancy and has Supervision of normal first pregnancy; Post-dates pregnancy; Normal delivery; Anemia, postpartum; and Supervision of normal pregnancy, antepartum on her problem list.  Patient reports no complaints.  Contractions: Regular. Vag. Bleeding: None.  Movement: Present. Denies leaking of fluid.   The following portions of the patient's history were reviewed and updated as appropriate: allergies, current medications, past family history, past medical history, past social history, past surgical history and problem list. Problem list updated.  Objective:   Vitals:   02/05/17 1448  BP: 111/67  Pulse: (!) 103  Weight: 221 lb (100.2 kg)    Fetal Status: Fetal Heart Rate (bpm): 150   Movement: Present     General:  Alert, oriented and cooperative. Patient is in no acute distress.  Skin: Skin is warm and dry. No rash noted.   Cardiovascular: Normal heart rate noted  Respiratory: Normal respiratory effort, no problems with respiration noted  Abdomen: Soft, gravid, appropriate for gestational age. Pain/Pressure: Present     Pelvic:  Cervical exam deferred        Extremities: Normal range of motion.  Edema: None  Mental Status: Normal mood and affect. Normal behavior. Normal judgment and thought content.   Urinalysis:      Assessment and Plan:  Pregnancy: G3P1011 at [redacted]w[redacted]d  1. Encounter for supervision of normal pregnancy, antepartum, unspecified gravidity   Preterm labor symptoms and general obstetric precautions including but not limited to vaginal bleeding, contractions, leaking of fluid and fetal movement were reviewed in detail with the patient. Please refer to After Visit Summary for other counseling recommendations.  Return in about 1 week (around 02/12/2017) for [place].   Shelly Bombard, MDPatient  ID: Erin Jacobs, female   DOB: [birth year], 21 y.o.   MRN: [id number]

## 2017-02-05 NOTE — Discharge Instructions (Signed)
Follow up with Dr. Jodi Mourning, return here for worsening symptoms.

## 2017-02-05 NOTE — MAU Provider Note (Signed)
[place] AT [facility] Provider Note   CSN: [id number] Arrival date & time: 02/05/17  1511     History   Chief Complaint Chief Complaint  Patient presents with  . Abdominal Pain  . Rupture of Membranes    HPI Erin Jacobs is a 21 y.o. G3P1011 @ [redacted]w[redacted]d gestation who presents to the MAU sent from [facility] for ? Leaking of fluid. Patient reports that this morning when she got out of bed she had a gush of fluid that ran down her leg. She reports contractions today. She denies UTI symptoms, fever, chills or other problems.  Last sexual intercourse last night.   Patient checked by Dr. Jodi Mourning in the office and she was closed. Patient here for evaluation for ? ROM.   Abdominal Pain  This is a new problem. The current episode started today. The problem occurs intermittently. The problem has been gradually worsening. The pain is located in the suprapubic region and RUQ. The pain is at a severity of 8/10. Pertinent negatives include no constipation, diarrhea, dysuria, fever, frequency, headaches, nausea or vomiting. Nothing aggravates the pain. The pain is relieved by nothing. She has tried acetaminophen for the symptoms. The treatment provided mild relief.    Past Medical History:  Diagnosis Date  . Medical history non-contributory     Patient Active Problem List   Diagnosis Date Noted  . Supervision of normal pregnancy, antepartum 08/16/2016  . Anemia, postpartum 03/12/2014  . Normal delivery 03/11/2014  . Post-dates pregnancy 03/09/2014  . Supervision of normal first pregnancy 08/15/2013    Past Surgical History:  Procedure Laterality Date  . leg suregry- childhood      OB History    Gravida Para Term Preterm AB Living   3 1 1  0 1 1   SAB TAB Ectopic Multiple Live Births   1 0 0 0 1       Home Medications    Prior to Admission medications   Medication Sig Start Date End Date Taking? Authorizing Provider  acetaminophen (TYLENOL) 500 MG tablet Take 1,000 mg by mouth  every 6 (six) hours as needed for mild pain, moderate pain or headache.    [provider]  NIFEdipine (PROCARDIA-XL/ADALAT-CC/NIFEDICAL-XL) 30 MG 24 hr tablet Take 1 tablet (30 mg total) by mouth daily. Can increase to twice a day as needed for symptomatic contractions Patient not taking: Reported on 02/05/2017 01/12/17   Osborne Oman, MD  Prenat-Fe Poly-Methfol-FA-DHA (VITAFOL ULTRA) 29-0.6-0.4-200 MG CAPS Take 1 capsule by mouth daily before breakfast. 08/16/16   Shelly Bombard, MD    Family History Family History  Problem Relation Age of Onset  . Diabetes Maternal Aunt   . Diabetes Maternal Uncle     Social History Social History  Substance Use Topics  . Smoking status: Never Smoker  . Smokeless tobacco: Never Used  . Alcohol use No     Allergies   Patient has no known allergies.   Review of Systems Review of Systems  Constitutional: Negative for chills and fever.  HENT: Negative.   Eyes: Negative for pain, discharge, redness and visual disturbance.  Respiratory: Negative for cough, chest tightness and shortness of breath.   Cardiovascular: Negative for chest pain.  Gastrointestinal: Positive for abdominal pain. Negative for constipation, diarrhea, nausea and vomiting.  Genitourinary: Positive for vaginal pain. Negative for dyspareunia, dysuria, frequency, urgency and vaginal discharge.  Musculoskeletal: Positive for back pain. Negative for neck stiffness.  Skin: Negative for rash.  Neurological: Negative for  dizziness, syncope and headaches.  Psychiatric/Behavioral: The patient is not nervous/anxious.      Physical Exam Updated Vital Signs BP (!) 110/56 (BP Location: Right Arm)   Pulse 87   Temp 98.8 F (37.1 C) (Oral)   Resp 19   LMP 05/18/2016   SpO2 100%   Physical Exam  Constitutional: She appears well-developed and well-nourished. No distress.  HENT:  Head: Normocephalic.  Eyes: EOM are normal.  Neck: Neck supple.  Cardiovascular:  Normal rate and regular rhythm.   Pulmonary/Chest: Effort normal and breath sounds normal.  Abdominal: Soft. There is no tenderness.  Gravid c/w dates  Genitourinary:  Genitourinary Comments: External genitalia without lesions, mucous d/c vaginal vault, no pooling. Cervix internal os closed, thick.   Musculoskeletal: Normal range of motion.  Neurological: She is alert.  Skin: Skin is warm and dry.  Psychiatric: She has a normal mood and affect. Her behavior is normal.  Nursing note and vitals reviewed.    ED Treatments / Results  Labs (all labs ordered are listed, but only abnormal results are displayed) Labs Reviewed  POCT FERN TEST - Normal  AMNISURE RUPTURE OF MEMBRANE (ROM) NOT AT [facility]   EFM BL FHT 135  Accelerations 15x15 Occasional contractions  Radiology No results found.  Procedures Procedures (including critical care time)  Medications Ordered in ED Medications - No data to display   Initial Impression / Assessment and Plan / ED Course  I have reviewed the triage vital signs and the nursing notes.  Pertinent lab results that were available during my care of the patient were reviewed by me and considered in my medical decision making (see chart for details).   Final Clinical Impressions(s) / ED Diagnoses  21 y.o. female here with watery vaginal d/c stable for d/c with negative fern, neg amnisure. Discussed with the patient clinical and lab findings and plan of care and all questioned fully answered. She follow up with Dr. Jodi Mourning or return here if any problems arise.  Final diagnoses:  Vaginal discharge during pregnancy in third trimester    New Prescriptions Current Discharge Medication List

## 2017-02-05 NOTE — MAU Note (Signed)
Woke up this morning, something gushed out, like water. Has been contracting all day. Pants and panties have continued to be wet.  Chair was wet. Was told by MD that the test there was negative, but she is leaking, and that she was dilated- but didn't say how much.  (not documented on chart). Still contracting. Fluid is clear, no bleeding

## 2017-02-14 ENCOUNTER — Ambulatory Visit (INDEPENDENT_AMBULATORY_CARE_PROVIDER_SITE_OTHER): Payer: Medicaid Other | Admitting: Obstetrics

## 2017-02-14 VITALS — BP 125/74 | HR 90 | Wt 225.4 lb

## 2017-02-14 DIAGNOSIS — Z3483 Encounter for supervision of other normal pregnancy, third trimester: Secondary | ICD-10-CM

## 2017-02-14 DIAGNOSIS — Z3490 Encounter for supervision of normal pregnancy, unspecified, unspecified trimester: Secondary | ICD-10-CM

## 2017-02-15 ENCOUNTER — Encounter: Payer: Self-pay | Admitting: Obstetrics

## 2017-02-15 NOTE — Progress Notes (Signed)
Subjective:  Erin Jacobs is a 21 y.o. G3P1011 at [redacted]w[redacted]d being seen today for ongoing prenatal care.  She is currently monitored for the following issues for this low-risk pregnancy and has Supervision of normal first pregnancy; Post-dates pregnancy; Normal delivery; Anemia, postpartum; and Supervision of normal pregnancy, antepartum on her problem list.  Patient reports occasional contractions.  Contractions: Irregular. Vag. Bleeding: None.  Movement: Present. Denies leaking of fluid.   The following portions of the patient's history were reviewed and updated as appropriate: allergies, current medications, past family history, past medical history, past social history, past surgical history and problem list. Problem list updated.  Objective:   Vitals:   02/14/17 1555  BP: 125/74  Pulse: 90  Weight: 225 lb 6.4 oz (102.2 kg)    Fetal Status: Fetal Heart Rate (bpm): 150   Movement: Present     General:  Alert, oriented and cooperative. Patient is in no acute distress.  Skin: Skin is warm and dry. No rash noted.   Cardiovascular: Normal heart rate noted  Respiratory: Normal respiratory effort, no problems with respiration noted  Abdomen: Soft, gravid, appropriate for gestational age. Pain/Pressure: Present     Pelvic:  Cervical exam performed      Cvx: 2cm / 70% / -2 / Vtx  Extremities: Normal range of motion.  Edema: None  Mental Status: Normal mood and affect. Normal behavior. Normal judgment and thought content.   Urinalysis:      Assessment and Plan:  Pregnancy: G3P1011 at [redacted]w[redacted]d  1. Encounter for supervision of normal pregnancy, antepartum, unspecified gravidity   Term labor symptoms and general obstetric precautions including but not limited to vaginal bleeding, contractions, leaking of fluid and fetal movement were reviewed in detail with the patient. Please refer to After Visit Summary for other counseling recommendations.  Return in about 1 week (around 02/21/2017) for  ROB.   Shelly Bombard, MD

## 2017-02-21 ENCOUNTER — Ambulatory Visit (INDEPENDENT_AMBULATORY_CARE_PROVIDER_SITE_OTHER): Payer: Medicaid Other | Admitting: Certified Nurse Midwife

## 2017-02-21 VITALS — BP 105/70 | HR 103 | Wt 227.0 lb

## 2017-02-21 DIAGNOSIS — Z3480 Encounter for supervision of other normal pregnancy, unspecified trimester: Secondary | ICD-10-CM

## 2017-02-21 DIAGNOSIS — Z3483 Encounter for supervision of other normal pregnancy, third trimester: Secondary | ICD-10-CM

## 2017-02-22 ENCOUNTER — Other Ambulatory Visit: Payer: Self-pay | Admitting: Certified Nurse Midwife

## 2017-02-22 DIAGNOSIS — Z3480 Encounter for supervision of other normal pregnancy, unspecified trimester: Secondary | ICD-10-CM

## 2017-02-23 ENCOUNTER — Encounter (HOSPITAL_COMMUNITY): Payer: Self-pay

## 2017-02-23 ENCOUNTER — Inpatient Hospital Stay (HOSPITAL_COMMUNITY)
Admission: AD | Admit: 2017-02-23 | Discharge: 2017-02-25 | DRG: 775 | Disposition: A | Discharge status: Home / Self Care | Payer: Medicaid Other | Source: Ambulatory Visit | Attending: Family Medicine | Admitting: Family Medicine

## 2017-02-23 DIAGNOSIS — O99824 Streptococcus B carrier state complicating childbirth: Secondary | ICD-10-CM | POA: Diagnosis present

## 2017-02-23 DIAGNOSIS — Z3A40 40 weeks gestation of pregnancy: Secondary | ICD-10-CM

## 2017-02-23 DIAGNOSIS — Z3480 Encounter for supervision of other normal pregnancy, unspecified trimester: Secondary | ICD-10-CM

## 2017-02-23 DIAGNOSIS — Z3493 Encounter for supervision of normal pregnancy, unspecified, third trimester: Secondary | ICD-10-CM | POA: Diagnosis present

## 2017-02-23 LAB — CBC
HEMATOCRIT: 30.4 % — AB (ref 36.0–46.0)
Hemoglobin: 9.5 g/dL — ABNORMAL LOW (ref 12.0–15.0)
MCH: 23.3 pg — AB (ref 26.0–34.0)
MCHC: 31.3 g/dL (ref 30.0–36.0)
MCV: 74.5 fL — AB (ref 78.0–100.0)
PLATELETS: 319 10*3/uL (ref 150–400)
RBC: 4.08 MIL/uL (ref 3.87–5.11)
RDW: 17.6 % — ABNORMAL HIGH (ref 11.5–15.5)
WBC: 10.6 10*3/uL — AB (ref 4.0–10.5)

## 2017-02-23 LAB — TYPE AND SCREEN
ABO/RH(D): B POS
Antibody Screen: NEGATIVE

## 2017-02-23 MED ORDER — SOD CITRATE-CITRIC ACID 500-334 MG/5ML PO SOLN: 30.0000 mL | ORAL | Status: DC | PRN

## 2017-02-23 MED ORDER — ACETAMINOPHEN 325 MG PO TABS
650.0000 mg | ORAL_TABLET | ORAL | Status: DC | PRN
  Administered 2017-02-24: 650 mg via ORAL
  Filled 2017-02-23: qty 2

## 2017-02-23 MED ORDER — DIPHENHYDRAMINE HCL 25 MG PO CAPS: 25.0000 mg | ORAL_CAPSULE | Freq: Four times a day (QID) | ORAL | Status: DC | PRN

## 2017-02-23 MED ORDER — LIDOCAINE HCL (PF) 1 % IJ SOLN
30.0000 mL | INTRAMUSCULAR | Status: DC | PRN
  Administered 2017-02-23: 30 mL via SUBCUTANEOUS
  Filled 2017-02-23: qty 30

## 2017-02-23 MED ORDER — IBUPROFEN 600 MG PO TABS
600.0000 mg | ORAL_TABLET | Freq: Four times a day (QID) | ORAL | Status: DC
  Administered 2017-02-23 – 2017-02-25 (×6): 600 mg via ORAL
  Filled 2017-02-23 (×6): qty 1

## 2017-02-23 MED ORDER — BENZOCAINE-MENTHOL 20-0.5 % EX AERO: 1.0000 "application " | INHALATION_SPRAY | CUTANEOUS | Status: DC | PRN

## 2017-02-23 MED ORDER — LACTATED RINGERS IV SOLN
500.0000 mL | INTRAVENOUS | Status: DC | PRN
Start: 2017-02-23 — End: 2017-02-23

## 2017-02-23 MED ORDER — SIMETHICONE 80 MG PO CHEW: 80.0000 mg | CHEWABLE_TABLET | ORAL | Status: DC | PRN

## 2017-02-23 MED ORDER — OXYCODONE-ACETAMINOPHEN 5-325 MG PO TABS
1.0000 | ORAL_TABLET | ORAL | Status: DC | PRN
  Administered 2017-02-23: 1 via ORAL
  Filled 2017-02-23: qty 1

## 2017-02-23 MED ORDER — WITCH HAZEL-GLYCERIN EX PADS: 1.0000 "application " | MEDICATED_PAD | CUTANEOUS | Status: DC | PRN

## 2017-02-23 MED ORDER — OXYTOCIN BOLUS FROM INFUSION: 500.0000 mL | Freq: Once | INTRAVENOUS | Status: DC

## 2017-02-23 MED ORDER — FENTANYL 2.5 MCG/ML BUPIVACAINE 1/10 % EPIDURAL INFUSION (WH - ANES): 14.0000 mL/h | INTRAMUSCULAR | Status: DC | PRN

## 2017-02-23 MED ORDER — ACETAMINOPHEN 325 MG PO TABS: 650.0000 mg | ORAL_TABLET | ORAL | Status: DC | PRN

## 2017-02-23 MED ORDER — ZOLPIDEM TARTRATE 5 MG PO TABS: 5.0000 mg | ORAL_TABLET | Freq: Every evening | ORAL | Status: DC | PRN

## 2017-02-23 MED ORDER — PENICILLIN G POTASSIUM 5000000 UNITS IJ SOLR
5.0000 10*6.[IU] | Freq: Once | INTRAMUSCULAR | Status: AC
  Administered 2017-02-23: 5 10*6.[IU] via INTRAVENOUS
  Filled 2017-02-23: qty 5

## 2017-02-23 MED ORDER — ONDANSETRON HCL 4 MG/2ML IJ SOLN: 4.0000 mg | Freq: Four times a day (QID) | INTRAMUSCULAR | Status: DC | PRN

## 2017-02-23 MED ORDER — COCONUT OIL OIL: 1.0000 "application " | TOPICAL_OIL | Status: DC | PRN

## 2017-02-23 MED ORDER — OXYCODONE HCL 5 MG PO TABS
5.0000 mg | ORAL_TABLET | ORAL | Status: DC | PRN
  Administered 2017-02-24: 5 mg via ORAL
  Filled 2017-02-23: qty 1

## 2017-02-23 MED ORDER — LACTATED RINGERS IV SOLN: 500.0000 mL | Freq: Once | INTRAVENOUS | Status: DC

## 2017-02-23 MED ORDER — ONDANSETRON HCL 4 MG PO TABS: 4.0000 mg | ORAL_TABLET | ORAL | Status: DC | PRN

## 2017-02-23 MED ORDER — TETANUS-DIPHTH-ACELL PERTUSSIS 5-2.5-18.5 LF-MCG/0.5 IM SUSP: 0.5000 mL | Freq: Once | INTRAMUSCULAR | Status: DC

## 2017-02-23 MED ORDER — PHENYLEPHRINE 40 MCG/ML (10ML) SYRINGE FOR IV PUSH (FOR BLOOD PRESSURE SUPPORT)
80.0000 ug | PREFILLED_SYRINGE | INTRAVENOUS | Status: DC | PRN
  Filled 2017-02-23: qty 5

## 2017-02-23 MED ORDER — FENTANYL CITRATE (PF) 100 MCG/2ML IJ SOLN
100.0000 ug | INTRAMUSCULAR | Status: DC | PRN
  Administered 2017-02-23 (×3): 100 ug via INTRAVENOUS
  Filled 2017-02-23 (×3): qty 2

## 2017-02-23 MED ORDER — FLEET ENEMA 7-19 GM/118ML RE ENEM: 1.0000 | ENEMA | RECTAL | Status: DC | PRN

## 2017-02-23 MED ORDER — EPHEDRINE 5 MG/ML INJ
10.0000 mg | INTRAVENOUS | Status: DC | PRN
  Filled 2017-02-23: qty 2

## 2017-02-23 MED ORDER — SENNOSIDES-DOCUSATE SODIUM 8.6-50 MG PO TABS
2.0000 | ORAL_TABLET | ORAL | Status: DC
  Administered 2017-02-24 – 2017-02-25 (×2): 2 via ORAL
  Filled 2017-02-23 (×2): qty 2

## 2017-02-23 MED ORDER — PRENATAL MULTIVITAMIN CH
1.0000 | ORAL_TABLET | Freq: Every day | ORAL | Status: DC
  Administered 2017-02-24: 1 via ORAL
  Filled 2017-02-23: qty 1

## 2017-02-23 MED ORDER — TERBUTALINE SULFATE 1 MG/ML IJ SOLN
0.2500 mg | Freq: Once | INTRAMUSCULAR | Status: DC | PRN
  Filled 2017-02-23: qty 1

## 2017-02-23 MED ORDER — PENICILLIN G POT IN DEXTROSE 60000 UNIT/ML IV SOLN
3.0000 10*6.[IU] | INTRAVENOUS | Status: DC
  Administered 2017-02-23: 3 10*6.[IU] via INTRAVENOUS
  Filled 2017-02-23 (×4): qty 50

## 2017-02-23 MED ORDER — OXYTOCIN 40 UNITS IN LACTATED RINGERS INFUSION - SIMPLE MED
2.5000 [IU]/h | INTRAVENOUS | Status: DC
  Administered 2017-02-23: 2.5 [IU]/h via INTRAVENOUS
  Filled 2017-02-23: qty 1000

## 2017-02-23 MED ORDER — DIPHENHYDRAMINE HCL 50 MG/ML IJ SOLN: 12.5000 mg | INTRAMUSCULAR | Status: DC | PRN

## 2017-02-23 MED ORDER — OXYTOCIN 40 UNITS IN LACTATED RINGERS INFUSION - SIMPLE MED
1.0000 m[IU]/min | INTRAVENOUS | Status: DC
  Administered 2017-02-23: 2 m[IU]/min via INTRAVENOUS

## 2017-02-23 MED ORDER — LACTATED RINGERS IV SOLN
INTRAVENOUS | Status: DC
  Administered 2017-02-23 (×2): via INTRAVENOUS

## 2017-02-23 MED ORDER — DIBUCAINE 1 % RE OINT: 1.0000 "application " | TOPICAL_OINTMENT | RECTAL | Status: DC | PRN

## 2017-02-23 MED ORDER — ONDANSETRON HCL 4 MG/2ML IJ SOLN: 4.0000 mg | INTRAMUSCULAR | Status: DC | PRN

## 2017-02-23 MED ORDER — OXYCODONE-ACETAMINOPHEN 5-325 MG PO TABS: 2.0000 | ORAL_TABLET | ORAL | Status: DC | PRN

## 2017-02-23 NOTE — Anesthesia Pain Management Evaluation Note (Signed)
  CRNA Pain Management Visit Note  Patient: Erin Jacobs, 21 y.o., female  "Hello I am a member of the anesthesia team at [facility]. We have an anesthesia team available at all times to provide care throughout the hospital, including epidural management and anesthesia for C-section. I don't know your plan for the delivery whether it a natural birth, water birth, IV sedation, nitrous supplementation, doula or epidural, but we want to meet your pain goals."   1.Was your pain managed to your expectations on prior hospitalizations?   Yes   2.What is your expectation for pain management during this hospitalization?     Labor support without medications  3.How can we help you reach that goal? Patient wants natural labor patient DIL to 6 at time of visit  Record the patient's initial score and the patient's pain goal.   Pain: 10  Pain Goal: 10 [facility] wants you to be able to say your pain was always managed very well.  Rayvon Char 02/23/2017

## 2017-02-23 NOTE — MAU Note (Signed)
Pt complained of burning in her arm from the penicillin. Turned rate down to 258ml/hr

## 2017-02-23 NOTE — H&P (Signed)
OBSTETRIC ADMISSION HISTORY AND PHYSICAL  Erin Jacobs is a 21 y.o. female G3P1011 with IUP at [redacted]w[redacted]d by LMP presenting for SOL. She reports +FMs, No LOF, no VB, no blurry vision, headaches or peripheral edema, and RUQ pain.  She plans on breast feeding. She request undecided method for birth control. She received her prenatal care at [facility]   Dating: By LMP --->  Estimated Date of Delivery: 02/22/17  Sono:  19wks, confirmed LMP dating  CWD, normal anatomy   Prenatal History/Complications:  Past Medical History: Past Medical History:  Diagnosis Date  . Medical history non-contributory     Past Surgical History: Past Surgical History:  Procedure Laterality Date  . leg suregry- childhood      Obstetrical History: OB History    Gravida Para Term Preterm AB Living   3 1 1  0 1 1   SAB TAB Ectopic Multiple Live Births   1 0 0 0 1      Social History: Social History   Social History  . Marital status: Single    Spouse name: N/A  . Number of children: N/A  . Years of education: N/A   Social History Main Topics  . Smoking status: Never Smoker  . Smokeless tobacco: Never Used  . Alcohol use No  . Drug use: No  . Sexual activity: Yes    Birth control/ protection: None   Other Topics Concern  . None   Social History Narrative  . None    Family History: Family History  Problem Relation Age of Onset  . Diabetes Maternal Aunt   . Diabetes Maternal Uncle     Allergies: No Known Allergies  Prescriptions Prior to Admission  Medication Sig Dispense Refill Last Dose  . acetaminophen (TYLENOL) 500 MG tablet Take 1,000 mg by mouth every 6 (six) hours as needed for mild pain, moderate pain or headache.   Past Week at Unknown time  . Prenat-Fe Poly-Methfol-FA-DHA (VITAFOL ULTRA) 29-0.6-0.4-200 MG CAPS Take 1 capsule by mouth daily before breakfast. 90 capsule 4 02/22/2017 at Unknown time     Review of Systems   All systems reviewed and negative except as stated in  HPI  Blood pressure 117/69, pulse 89, temperature 98.9 F (37.2 C), temperature source Oral, resp. rate 18, height 5\' 5"  (1.651 m), weight 229 lb (103.9 kg), last menstrual period 05/18/2016, SpO2 99 %, unknown if currently breastfeeding. General appearance: alert, cooperative and no distress Lungs: Normal WOB Heart: regular rate and rhythm Abdomen: soft, non-tender; bowel sounds normal Extremities: Homans sign is negative, no sign of DVT Presentation: cephalic Fetal monitoringBaseline: 135 bpm, Variability: Good {> 6 bpm), Accelerations: Reactive and Decelerations: Absent Uterine activityFrequency: Every 5-6 minutes Dilation: 5 Effacement (%): 50, 40 Station: -3 Exam by:: Dr. Tobie Poet   Prenatal labs: ABO, Rh: --/--/B POS (08/10 0930) Antibody: NEG (08/10 0930) Rubella: 12.90 (01/31 1522) RPR: Non Reactive (05/17 1055)  HBsAg: Negative (01/31 1522)  HIV:    GBS: Positive (07/09 1642)  1 hr Glucola normal Genetic screening  normal Anatomy US normal  Prenatal Transfer Tool  Maternal Diabetes: No Genetic Screening: Normal Maternal Ultrasounds/Referrals: Normal Fetal Ultrasounds or other Referrals:  None Maternal Substance Abuse:  No Significant Maternal Medications:  None Significant Maternal Lab Results: Lab values include: Group B Strep positive  Results for orders placed or performed during the hospital encounter of 02/23/17 (from the past 24 hour(s))  CBC   Collection Time: 02/23/17  9:30 AM  Result Value Ref Range  WBC 10.6 (H) 4.0 - 10.5 K/uL   RBC 4.08 3.87 - 5.11 MIL/uL   Hemoglobin 9.5 (L) 12.0 - 15.0 g/dL   HCT 30.4 (L) 36.0 - 46.0 %   MCV 74.5 (L) 78.0 - 100.0 fL   MCH 23.3 (L) 26.0 - 34.0 pg   MCHC 31.3 30.0 - 36.0 g/dL   RDW 17.6 (H) 11.5 - 15.5 %   Platelets 319 150 - 400 K/uL  Type and screen [place]   Collection Time: 02/23/17  9:30 AM  Result Value Ref Range   ABO/RH(D) B POS    Antibody Screen NEG    Sample Expiration  02/26/2017     Patient Active Problem List   Diagnosis Date Noted  . Indication for care or intervention related to labor and delivery 02/23/2017  . Supervision of normal pregnancy, antepartum 08/16/2016    Assessment/Plan:  Erin Jacobs is a 21 y.o. G3P1011 at [redacted]w[redacted]d here for SOL. Made some cervical change in AMU, but contractions spaced out on admission. Plan to admit for expectant management and augmentation.   #Labor: SOL. Augmentation with AROM @1500  clear. Pitocin started same time #Pain: IV pain med Nitrous oxide  #FWB: Cat1 #ID: GBS positive. Pen #MOF: [place] #MOC:Undecided #Circ: Unknown  Windell Moment, MD  02/23/2017, 3:25 PM  CNM attestation:  I have seen and examined this patient; I agree with above documentation in the resident's note.   Erin Jacobs is a 21 y.o. G3P1011 here for latent labor  PE: BP 124/73   Pulse 96   Temp 98.9 F (37.2 C) (Oral)   Resp 17   Ht 5\' 5"  (1.651 m)   Wt 103.9 kg (229 lb)   LMP 05/18/2016   SpO2 99%   BMI 38.11 kg/m  Gen: calm comfortable, NAD Resp: normal effort, no distress Abd: gravid  ROS, labs, PMH reviewed  Plan: Admit to [facility] to augment per pt request; ctx have spaced out PCN for GBS ppx Anticipate SVD  Serita Grammes CNM 02/23/2017, 4:42 PM

## 2017-02-23 NOTE — MAU Note (Signed)
Contractions started at 0430.  Getting stronger and closer, now every 4 min.  Had some mucous come out then some blood. Was 1-2 cm on Wed.  No problems with preg

## 2017-02-23 NOTE — Progress Notes (Signed)
Patient ID: Erin Jacobs, female   DOB: Jan 21, 1996, 21 y.o.   MRN: [id number]  Feeling rectal pressure  VSS, afeb FHR 130s, +accels, occ variables Ctx q 2-3 mins w/ Pit @ 31mu/min Cx 8/C/+1 w/ ctx  IUP@term  Active labor/transition  Anticipate beginning to push/delivery before too [place], [place] 02/23/2017 6:56 PM

## 2017-02-23 NOTE — Progress Notes (Signed)
Labor Progress Note Erin Jacobs is a 21 y.o. G3P1011 at [redacted]w[redacted]d presented for SOL S: Feeling contractions  O:  BP 127/67   Pulse 95   Temp 97.7 F (36.5 C) (Oral)   Resp 20   Ht 5\' 5"  (1.651 m)   Wt 229 lb (103.9 kg)   LMP 05/18/2016   SpO2 99%   BMI 38.11 kg/m  EFM: 135/Moderate/Accels, Early Decels  CVE: Dilation: 7 Effacement (%): 80 Station: -2 Presentation: Vertex Exam by:: Lina Sar RN    A&P: 21 y.o. G3P1011 [redacted]w[redacted]d SOL with augmentation #Labor augmentation: Progressing well. Continuing to titrate Pitocin. AROM clear at 1500 #Pain: IV pain medication and N2O #FWB: Cat1 #GBS positive, PCN initiated.   Windell Moment, MD 5:59 PM

## 2017-02-23 NOTE — Progress Notes (Signed)
   PRENATAL VISIT NOTE  Subjective:  Erin Jacobs is a 21 y.o. G3P1011 at [redacted]w[redacted]d being seen today for ongoing prenatal care.  She is currently monitored for the following issues for this low-risk pregnancy and has Supervision of normal pregnancy, antepartum on her problem list.  Patient reports no complaints.  Contractions: Irregular. Vag. Bleeding: None.  Movement: Present. Denies leaking of fluid.   The following portions of the patient's history were reviewed and updated as appropriate: allergies, current medications, past family history, past medical history, past social history, past surgical history and problem list. Problem list updated.  Objective:   Vitals:   02/22/17 0842  BP: 105/70  Pulse: (!) 103  Weight: 227 lb (103 kg)    Fetal Status: Fetal Heart Rate (bpm): 138 Fundal Height: 40 cm Movement: Present  Presentation: Vertex  General:  Alert, oriented and cooperative. Patient is in no acute distress.  Skin: Skin is warm and dry. No rash noted.   Cardiovascular: Normal heart rate noted  Respiratory: Normal respiratory effort, no problems with respiration noted  Abdomen: Soft, gravid, appropriate for gestational age.  Pain/Pressure: Present     Pelvic: Cervical exam performed Dilation: 1 Effacement (%): Thick Station: -3  Extremities: Normal range of motion.  Edema: Trace  Mental Status:  Normal mood and affect. Normal behavior. Normal judgment and thought content.   Assessment and Plan:  Pregnancy: G3P1011 at [redacted]w[redacted]d  1. Supervision of other normal pregnancy, antepartum     Doing well.  IOL scheduled.     Term labor symptoms and general obstetric precautions including but not limited to vaginal bleeding, contractions, leaking of fluid and fetal movement were reviewed in detail with the patient. Please refer to After Visit Summary for other counseling recommendations.  Return in about 4 weeks (around 03/21/2017) for Postpartum.   Morene Crocker, CNM

## 2017-02-24 LAB — RPR: RPR: NONREACTIVE

## 2017-02-24 NOTE — Progress Notes (Signed)
POSTPARTUM PROGRESS NOTE  Post Partum Day 1  Subjective:  Erin Jacobs is a 21 y.o. [id number] s/p SVD at [redacted]w[redacted]d.  No acute events overnight.  Pt denies problems with ambulating, voiding or po intake.  She denies nausea or vomiting.  Pain is well controlled. She has not had bowel movement.  Lochia Small.   Objective: Blood pressure 114/60, pulse 85, temperature 98.7 F (37.1 C), temperature source Oral, resp. rate 18, height 5\' 5"  (1.651 m), weight 103.9 kg (229 lb), last menstrual period 05/18/2016, SpO2 100 %, unknown if currently breastfeeding.  Physical Exam:  General: alert, cooperative and no distress Chest: no respiratory distress Abdomen: soft, nontender,  Uterine Fundus: firm, appropriately tender DVT Evaluation: No calf swelling or tenderness Extremities: No edema Skin: warm, dry   Recent Labs  02/23/17 0930  HGB 9.5*  HCT 30.4*    Assessment/Plan: Erin Jacobs is a 21 y.o. [id number] s/p SVD at [redacted]w[redacted]d. Desires discharge pending discharge of baby by pediatrician. Planning for circumcision at PCP.   PPD#1 - Doing well Contraception: Depo Feeding: Bottle Dispo: Plan for discharge 8/12.   LOS: 1 day   Erin Jacobs, MS3 02/24/2017, 9:08 AM

## 2017-02-24 NOTE — Discharge Instructions (Signed)

## 2017-02-24 NOTE — Progress Notes (Signed)
CSW received consult for hx of marijuana use.  Referral was screened out due to the following: ~MOB had no documented substance use after initial prenatal visit/+UPT. ~MOB had no positive drug screens after initial prenatal visit/+UPT. ~Baby's UDS is negative.  Please consult CSW if current concerns arise or by MOB's request.  CSW will monitor CDS results and make report to Child Protective Services if warranted.  Hara Milholland, MSW, LCSW-A Clinical Social Worker  [facility]  Office: [phone]   

## 2017-02-25 MED ORDER — IBUPROFEN 600 MG PO TABS: 600.0000 mg | ORAL_TABLET | Freq: Four times a day (QID) | ORAL | 0 refills | Status: DC

## 2017-02-25 NOTE — Discharge Summary (Signed)
OB Discharge Summary     Patient Name: Erin Jacobs DOB: February 12, 1996 MRN: [id number]  Date of admission: 02/23/2017 Delivering MD: Windell Moment   Date of discharge: 02/25/2017  Admitting diagnosis: 40WKS,CTX Intrauterine pregnancy: [redacted]w[redacted]d     Secondary diagnosis:  Active Problems:   Indication for care or intervention related to labor and delivery  Additional problems:  Patient Active Problem List   Diagnosis Date Noted  . Indication for care or intervention related to labor and delivery 02/23/2017  . Supervision of normal pregnancy, antepartum 08/16/2016       Discharge diagnosis: Term Pregnancy Delivered                                                                                                Post partum procedures:none  Augmentation: AROM and Pitocin  Complications: None  Hospital course:  Onset of Labor With Vaginal Delivery     21 y.o. yo [id number] at [redacted]w[redacted]d was admitted in Latent Labor  on 02/23/2017. Patient had an uncomplicated labor course as follows:  Membrane Rupture Time/Date: 3:09 PM ,02/23/2017   Intrapartum Procedures: Episiotomy: None [1]                                         Lacerations:  Sulcus [9]  Patient had a delivery of a Viable infant. 02/23/2017  Information for the patient's newborn:  Belkys, [place] [[id number]]       Pateint had an uncomplicated postpartum course.  She is ambulating, tolerating a regular diet, passing flatus, and urinating well. Patient is discharged home in stable condition on 02/25/17.   Physical exam  Vitals:   02/24/17 0552 02/24/17 1015 02/24/17 1900 02/25/17 0632  BP: 114/60 132/67 112/71 (!) 101/48  Pulse: 85  99 77  Resp: 18 20 20 18   Temp: 98.7 F (37.1 C) 98.7 F (37.1 C) 98.6 F (37 C) 99.2 F (37.3 C)  TempSrc: Oral Oral Oral Oral  SpO2:      Weight:      Height:       General: alert, cooperative and no distress Lochia: appropriate Uterine Fundus: firm Incision: N/A DVT Evaluation: No  evidence of DVT seen on physical exam. No cords or calf tenderness. No significant calf/ankle edema. Labs: Lab Results  Component Value Date   WBC 10.6 (H) 02/23/2017   HGB 9.5 (L) 02/23/2017   HCT 30.4 (L) 02/23/2017   MCV 74.5 (L) 02/23/2017   PLT 319 02/23/2017   CMP Latest Ref Rng & Units 02/12/2016  Glucose 65 - 99 mg/dL 79  BUN 6 - 20 mg/dL 7  Creatinine 0.44 - 1.00 mg/dL 0.80  Sodium 135 - 145 mmol/L 141  Potassium 3.5 - 5.1 mmol/L 3.5  Chloride 101 - 111 mmol/L 109  CO2 19 - 32 mEq/L -  Calcium 8.4 - 10.5 mg/dL -  Total Protein 6.0 - 8.3 g/dL -  Total Bilirubin 0.3 - 1.2 mg/dL -  Alkaline Phos 39 - 117 U/L -  AST 0 - 37 U/L -  ALT 0 - 35 U/L -    Discharge instruction: per After Visit Summary and "Baby and Me Booklet".  After visit meds:  Allergies as of 02/25/2017   No Known Allergies     Medication List    TAKE these medications   acetaminophen 500 MG tablet Commonly known as:  TYLENOL Take 1,000 mg by mouth every 6 (six) hours as needed for mild pain, moderate pain or headache.   ibuprofen 600 MG tablet Commonly known as:  ADVIL,MOTRIN Take 1 tablet (600 mg total) by mouth every 6 (six) hours.   VITAFOL ULTRA 29-0.6-0.4-200 MG Caps Take 1 capsule by mouth daily before breakfast.       Diet: routine diet  Activity: Advance as tolerated. Pelvic rest for 6 weeks.   Outpatient follow up:4-6 weeks Follow up Appt:Future Appointments Date Time Provider [place]  03/28/2017 2:30 PM Woodroe Mode, MD [place] None   Follow up Visit:No Follow-up on file.  Postpartum contraception: Depo Provera  Newborn Data: Live born female  Birth Weight: 6 lb 15.3 oz (3155 g) APGAR: 9, 9  Baby Feeding: Bottle Disposition:home with mother   02/25/2017 [place] Hally Colella, DO

## 2017-03-01 ENCOUNTER — Inpatient Hospital Stay (HOSPITAL_COMMUNITY): Admission: RE | Admit: 2017-03-01 | Payer: Medicaid Other | Source: Ambulatory Visit

## 2017-03-07 ENCOUNTER — Encounter (HOSPITAL_COMMUNITY): Payer: Self-pay | Admitting: Emergency Medicine

## 2017-03-07 ENCOUNTER — Emergency Department (HOSPITAL_COMMUNITY): Payer: Medicaid Other

## 2017-03-07 DIAGNOSIS — G510 Bell's palsy: Secondary | ICD-10-CM | POA: Diagnosis not present

## 2017-03-07 DIAGNOSIS — R29810 Facial weakness: Secondary | ICD-10-CM | POA: Diagnosis present

## 2017-03-07 LAB — DIFFERENTIAL
BASOS PCT: 0 %
Basophils Absolute: 0 10*3/uL (ref 0.0–0.1)
Eosinophils Absolute: 0.2 10*3/uL (ref 0.0–0.7)
Eosinophils Relative: 1 %
LYMPHS ABS: 2.3 10*3/uL (ref 0.7–4.0)
Lymphocytes Relative: 21 %
MONO ABS: 0.6 10*3/uL (ref 0.1–1.0)
MONOS PCT: 6 %
NEUTROS ABS: 7.5 10*3/uL (ref 1.7–7.7)
Neutrophils Relative %: 72 %

## 2017-03-07 LAB — I-STAT TROPONIN, ED: Troponin i, poc: 0 ng/mL (ref 0.00–0.08)

## 2017-03-07 LAB — CBC
HEMATOCRIT: 30.8 % — AB (ref 36.0–46.0)
HEMOGLOBIN: 9.2 g/dL — AB (ref 12.0–15.0)
MCH: 22.5 pg — ABNORMAL LOW (ref 26.0–34.0)
MCHC: 29.9 g/dL — AB (ref 30.0–36.0)
MCV: 75.3 fL — AB (ref 78.0–100.0)
Platelets: 431 10*3/uL — ABNORMAL HIGH (ref 150–400)
RBC: 4.09 MIL/uL (ref 3.87–5.11)
RDW: 17.2 % — AB (ref 11.5–15.5)
WBC: 10.5 10*3/uL (ref 4.0–10.5)

## 2017-03-07 LAB — I-STAT CHEM 8, ED
BUN: 3 mg/dL — AB (ref 6–20)
CHLORIDE: 106 mmol/L (ref 101–111)
Calcium, Ion: 1.19 mmol/L (ref 1.15–1.40)
Creatinine, Ser: 1 mg/dL (ref 0.44–1.00)
Glucose, Bld: 91 mg/dL (ref 65–99)
HEMATOCRIT: 29 % — AB (ref 36.0–46.0)
Hemoglobin: 9.9 g/dL — ABNORMAL LOW (ref 12.0–15.0)
Potassium: 3.3 mmol/L — ABNORMAL LOW (ref 3.5–5.1)
SODIUM: 142 mmol/L (ref 135–145)
TCO2: 25 mmol/L (ref 0–100)

## 2017-03-07 LAB — APTT: aPTT: 32 seconds (ref 24–36)

## 2017-03-07 LAB — PROTIME-INR
INR: 1.12
Prothrombin Time: 14.4 seconds (ref 11.4–15.2)

## 2017-03-07 MED ORDER — IBUPROFEN 400 MG PO TABS
400.0000 mg | ORAL_TABLET | Freq: Once | ORAL | Status: AC | PRN
  Administered 2017-03-08: 400 mg via ORAL

## 2017-03-07 NOTE — ED Triage Notes (Signed)
Per EMS, pt from home with right sided facial droop that began Saturday morning. Pt had a baby 2 weeks ago, no complications, no epidural/spinal. Pt c/o right sided ear pain with coughing. A&O x 4, no other neuro deficits. EMS vitals: BP-135/85, P-92, SpO2-97% room air, RR-18, CBG-93

## 2017-03-08 ENCOUNTER — Emergency Department (HOSPITAL_COMMUNITY)
Admission: EM | Admit: 2017-03-08 | Discharge: 2017-03-08 | Disposition: A | Discharge status: Home / Self Care | Payer: Medicaid Other | Attending: Emergency Medicine | Admitting: Emergency Medicine

## 2017-03-08 DIAGNOSIS — G510 Bell's palsy: Secondary | ICD-10-CM

## 2017-03-08 LAB — COMPREHENSIVE METABOLIC PANEL
ALBUMIN: 3.4 g/dL — AB (ref 3.5–5.0)
ALK PHOS: 88 U/L (ref 38–126)
ALT: 13 U/L — ABNORMAL LOW (ref 14–54)
ANION GAP: 8 (ref 5–15)
AST: 16 U/L (ref 15–41)
BUN: 5 mg/dL — AB (ref 6–20)
CALCIUM: 9.2 mg/dL (ref 8.9–10.3)
CO2: 25 mmol/L (ref 22–32)
Chloride: 108 mmol/L (ref 101–111)
Creatinine, Ser: 1.02 mg/dL — ABNORMAL HIGH (ref 0.44–1.00)
GFR calc Af Amer: >60 mL/min (ref >60)
GFR calc non Af Amer: >60 mL/min (ref >60)
GLUCOSE: 94 mg/dL (ref 65–99)
POTASSIUM: 3.3 mmol/L — AB (ref 3.5–5.1)
SODIUM: 141 mmol/L (ref 135–145)
Total Bilirubin: 0.6 mg/dL (ref 0.3–1.2)
Total Protein: 7.3 g/dL (ref 6.5–8.1)

## 2017-03-08 MED ORDER — VALACYCLOVIR HCL 1 G PO TABS: 1000.0000 mg | ORAL_TABLET | Freq: Three times a day (TID) | ORAL | 0 refills | Status: AC

## 2017-03-08 MED ORDER — ARTIFICIAL TEARS OPHTHALMIC OINT: TOPICAL_OINTMENT | Freq: Every day | OPHTHALMIC | 0 refills | Status: DC

## 2017-03-08 MED ORDER — IBUPROFEN 400 MG PO TABS
ORAL_TABLET | ORAL | Status: AC
  Administered 2017-03-08: 400 mg via ORAL
  Filled 2017-03-08: qty 1

## 2017-03-08 MED ORDER — PREDNISONE 20 MG PO TABS
60.0000 mg | ORAL_TABLET | Freq: Once | ORAL | Status: AC
  Administered 2017-03-08: 60 mg via ORAL
  Filled 2017-03-08: qty 3

## 2017-03-08 MED ORDER — VALACYCLOVIR HCL 500 MG PO TABS
1000.0000 mg | ORAL_TABLET | Freq: Once | ORAL | Status: AC
  Administered 2017-03-08: 1000 mg via ORAL
  Filled 2017-03-08: qty 2

## 2017-03-08 MED ORDER — PREDNISONE 20 MG PO TABS: ORAL_TABLET | ORAL | 0 refills | Status: DC

## 2017-03-08 NOTE — ED Notes (Signed)
PT states understanding of care given, follow up care, and medication prescribed. PT ambulated from ED to car with a steady gait. 

## 2017-03-08 NOTE — ED Provider Notes (Signed)
[facility]EM[facility]Y DEPT Provider Note   CSN: [id number] Arrival date & time: 03/07/17  2314     History   Chief Complaint Chief Complaint  Patient presents with  . Facial Droop    HPI Erin Jacobs is a 21 y.o. female.  HPI  21 year old female who gave birth on 8/12 presents with right-sided facial droop. She states that this started about 5 days ago. She first noticed it when feeding her baby in the middle the night. Since then it has not gotten better or worse. She did not come for evaluation it initially because she thought it might just get better. She has had headache and right ear pain on and off during this time. Also some coughing and sneezing. Both of these make the ear pain worse. She has had trouble closing her right eye and has been having water and food come out of her mouth due to the droop. Some slurred speech. No weakness or numbness in her extremities. No fevers at home but she was noted to have a temperature 100.8 at home. Tylenol has helped the headache. No neck stiffness. Otherwise her birth was uncomplicated and natural. Her baby is bottle-fed and she has not been nursing.  Past Medical History:  Diagnosis Date  . Medical history non-contributory     Patient Active Problem List   Diagnosis Date Noted  . NSVD (normal spontaneous vaginal delivery) 02/25/2017  . Indication for care or intervention related to labor and delivery 02/23/2017  . Supervision of normal pregnancy, antepartum 08/16/2016    Past Surgical History:  Procedure Laterality Date  . leg suregry- childhood      OB History    Gravida Para Term Preterm AB Living   3 2 2  0 1 2   SAB TAB Ectopic Multiple Live Births   1 0 0 0 2       Home Medications    Prior to Admission medications   Medication Sig Start Date End Date Taking? Authorizing Provider  acetaminophen (TYLENOL) 500 MG tablet Take 1,000 mg by mouth every 6 (six) hours as needed for mild pain, moderate pain or headache.     [provider]  artificial tears (LACRILUBE) OINT ophthalmic ointment Place into the right eye at bedtime. 03/08/17   Sherwood Gambler, MD  ibuprofen (ADVIL,MOTRIN) 600 MG tablet Take 1 tablet (600 mg total) by mouth every 6 (six) hours. 02/25/17   Shirley, [place], DO  predniSONE (DELTASONE) 20 MG tablet 3 tabs po daily x 3 days, then 2 tabs x 3 days, then 1.5 tabs x 3 days, then 1 tab x 3 days, then 0.5 tabs x 3 days 03/08/17   Sherwood Gambler, MD  Prenat-Fe Poly-Methfol-FA-DHA (VITAFOL ULTRA) 29-0.6-0.4-200 MG CAPS Take 1 capsule by mouth daily before breakfast. 08/16/16   Shelly Bombard, MD  valACYclovir (VALTREX) 1000 MG tablet Take 1 tablet (1,000 mg total) by mouth 3 (three) times daily. 03/08/17 03/22/17  Sherwood Gambler, MD    Family History Family History  Problem Relation Age of Onset  . Diabetes Maternal Aunt   . Diabetes Maternal Uncle     Social History Social History  Substance Use Topics  . Smoking status: Never Smoker  . Smokeless tobacco: Never Used  . Alcohol use No     Allergies   Patient has no known allergies.   Review of Systems Review of Systems  Constitutional: Negative for fever.  HENT: Positive for ear pain and sneezing.   Respiratory: Positive for  cough. Negative for shortness of breath.   Gastrointestinal: Negative for vomiting.  Musculoskeletal: Negative for neck pain.  Neurological: Positive for speech difficulty and headaches. Negative for weakness and numbness.  All other systems reviewed and are negative.    Physical Exam Updated Vital Signs BP 130/77   Pulse 96   Temp (!) 100.8 F (38.2 C)   Resp 20   Ht 5\' 6"  (1.676 m)   Wt 94.3 kg (208 lb)   LMP 05/18/2016   SpO2 100%   BMI 33.57 kg/m   Physical Exam  Constitutional: She is oriented to person, place, and time. She appears well-developed and well-nourished.  HENT:  Head: Normocephalic and atraumatic.  Right Ear: Tympanic membrane, external ear and ear canal normal.    Left Ear: Tympanic membrane, external ear and ear canal normal.  Nose: Nose normal.  Mouth/Throat: Oropharynx is clear and moist. No oropharyngeal exudate.  Right facial droop when smiling, minimal at rest. Right eyelid does not close fully and is easily opened. Right forehead does not move as the left does  Eyes: Pupils are equal, round, and reactive to light. EOM are normal. Right eye exhibits no discharge. Left eye exhibits no discharge.  Neck: Normal range of motion. Neck supple.  Cardiovascular: Normal rate, regular rhythm and normal heart sounds.   Pulmonary/Chest: Effort normal and breath sounds normal.  Abdominal: Soft. There is no tenderness.  Neurological: She is alert and oriented to person, place, and time.  CN 3-12 grossly intact save for abnormalities noted in HENT. 5/5 strength in all 4 extremities. Grossly normal sensation. Normal finger to nose.   Skin: Skin is warm and dry.  Nursing note and vitals reviewed.    ED Treatments / Results  Labs (all labs ordered are listed, but only abnormal results are displayed) Labs Reviewed  CBC - Abnormal; Notable for the following:       Result Value   Hemoglobin 9.2 (*)    HCT 30.8 (*)    MCV 75.3 (*)    MCH 22.5 (*)    MCHC 29.9 (*)    RDW 17.2 (*)    Platelets 431 (*)    All other components within normal limits  COMPREHENSIVE METABOLIC PANEL - Abnormal; Notable for the following:    Potassium 3.3 (*)    BUN 5 (*)    Creatinine, Ser 1.02 (*)    Albumin 3.4 (*)    ALT 13 (*)    All other components within normal limits  I-STAT CHEM 8, ED - Abnormal; Notable for the following:    Potassium 3.3 (*)    BUN 3 (*)    Hemoglobin 9.9 (*)    HCT 29.0 (*)    All other components within normal limits  PROTIME-INR  APTT  DIFFERENTIAL  I-STAT TROPONIN, ED  CBG MONITORING, ED    EKG  EKG Interpretation  Date/Time:  Wednesday March 07 2017 23:29:01 EDT Ventricular Rate:  86 PR Interval:  138 QRS Duration: 70 QT  Interval:  362 QTC Calculation: 433 R Axis:   60 Text Interpretation:  Normal sinus rhythm Normal ECG Confirmed by Orpah Greek [phone]) on 03/08/2017 1:15:39 AM       Radiology Ct Head Wo Contrast  Result Date: 03/07/2017 CLINICAL DATA:  Right-sided facial droop x4 days. EXAM: CT HEAD WITHOUT CONTRAST TECHNIQUE: Contiguous axial images were obtained from the base of the skull through the vertex without intravenous contrast. COMPARISON:  None. FINDINGS: Brain: No evidence of acute  infarction, hemorrhage, hydrocephalus, extra-axial collection or mass lesion/mass effect. Small dural calcification of incidental note along the left tentorium. Vascular: No hyperdense vessel or unexpected calcification. Skull: Normal. Negative for fracture or focal lesion. Sinuses/Orbits: No acute finding. Other: None IMPRESSION: No acute intracranial abnormality. Electronically Signed   By: Ashley Royalty M.D.   On: 03/07/2017 23:59    Procedures Procedures (including critical care time)  Medications Ordered in ED Medications  predniSONE (DELTASONE) tablet 60 mg (not administered)  valACYclovir (VALTREX) tablet 1,000 mg (not administered)  ibuprofen (ADVIL,MOTRIN) tablet 400 mg (400 mg Oral Given 03/08/17 0035)     Initial Impression / Assessment and Plan / ED Course  I have reviewed the triage vital signs and the nursing notes.  Pertinent labs & imaging results that were available during my care of the patient were reviewed by me and considered in my medical decision making (see chart for details).     Patient's presentation is consistent with Bell's palsy. Likely from a viral illness based on her concomitant ear pain, cough, and sneezing. She has a low-grade fever here. My suspicion for a CNS infection is low given her otherwise well appearance and no meningismus. She was given ibuprofen in the waiting room. She had labs and a CT scan ordered in triage but my suspicion for stroke or other acute CNS  emergency is low. Otherwise findings classic for Bell's palsy. Will be treated with prednisone and acyclovir. Lacri-Lube for her eye. Discussed need for follow-up with ENT and return precautions.  Final Clinical Impressions(s) / ED Diagnoses   Final diagnoses:  Bell's palsy    New Prescriptions New Prescriptions   ARTIFICIAL TEARS (LACRILUBE) OINT OPHTHALMIC OINTMENT    Place into the right eye at bedtime.   PREDNISONE (DELTASONE) 20 MG TABLET    3 tabs po daily x 3 days, then 2 tabs x 3 days, then 1.5 tabs x 3 days, then 1 tab x 3 days, then 0.5 tabs x 3 days   VALACYCLOVIR (VALTREX) 1000 MG TABLET    Take 1 tablet (1,000 mg total) by mouth 3 (three) times daily.     Sherwood Gambler, MD 03/08/17 [phone]

## 2017-03-14 NOTE — Progress Notes (Signed)
Post discharge chart review completed.  

## 2017-03-28 ENCOUNTER — Ambulatory Visit (INDEPENDENT_AMBULATORY_CARE_PROVIDER_SITE_OTHER): Payer: Medicaid Other | Admitting: Obstetrics & Gynecology

## 2017-03-28 ENCOUNTER — Ambulatory Visit: Payer: Medicaid Other | Admitting: Obstetrics & Gynecology

## 2017-03-28 DIAGNOSIS — Z3202 Encounter for pregnancy test, result negative: Secondary | ICD-10-CM | POA: Diagnosis not present

## 2017-03-28 DIAGNOSIS — Z1389 Encounter for screening for other disorder: Secondary | ICD-10-CM | POA: Diagnosis not present

## 2017-03-28 DIAGNOSIS — Z308 Encounter for other contraceptive management: Secondary | ICD-10-CM

## 2017-03-28 LAB — POCT URINE PREGNANCY: PREG TEST UR: NEGATIVE

## 2017-03-28 MED ORDER — NORELGESTROMIN-ETH ESTRADIOL 150-35 MCG/24HR TD PTWK: 1.0000 | MEDICATED_PATCH | TRANSDERMAL | 12 refills | Status: DC

## 2017-03-28 NOTE — Progress Notes (Signed)
Patient wants the Patch for [facility]. Subjective:     Erin Jacobs is a 21 y.o. female who presents for a postpartum visit. She is 4 weeks postpartum following a spontaneous vaginal delivery. I have fully reviewed the prenatal and intrapartum course. The delivery was at 40.1 gestational weeks. Outcome: spontaneous vaginal delivery. Anesthesia: none. Postpartum course has been UNREMARKABLE. Baby's course has been UNREMARKABLE. Baby is feeding by bottle - Similac Advance. Bleeding no bleeding. Bowel function is normal. Bladder function is normal. Patient is sexually active. Contraception method is condoms. Postpartum depression screening: negative.  The following portions of the patient's history were reviewed and updated as appropriate: allergies, current medications, past family history, past medical history, past social history, past surgical history and problem list.  Review of Systems Pertinent items are noted in HPI.   Objective:    There were no vitals taken for this visit.  General:  alert, cooperative and no distress           Abdomen: soft, non-tender; bowel sounds normal; no masses,  no organomegaly   Vulva:  not evaluated  Vagina: not evaluated                    Assessment:     normal postpartum exam. Pap smear not done at today's visit.   Plan:    1. Contraception: Ortho-Evra patches weekly 2. Contraceptive patch explained 3. Follow up in: 1 year or as needed.    Woodroe Mode, MD 03/28/2017

## 2017-03-28 NOTE — Patient Instructions (Signed)
Hormonal Contraception Information °Hormonal contraception is a type of birth control that uses hormones to prevent pregnancy. It usually involves a combination of the hormones estrogen and progesterone or only the hormone progesterone. Hormonal contraception works in these ways: °· It thickens the mucus in the cervix, making it harder for sperm to enter the uterus. °· It changes the lining of the uterus, making it harder for an egg to implant. °· It may stop the ovaries from releasing eggs (ovulation). Some women who take hormonal contraceptives that contain only progesterone may continue to ovulate. ° °Hormonal contraception cannot prevent sexually transmitted infections (STIs). Pregnancy may still occur. °Estrogen and progesterone contraceptives °Contraceptives that use a combination of estrogen and progesterone are available in these forms: °· Pill. Pills come in different combinations of hormones. They must be taken at the same time each day. Pills can affect your period, causing you to get your period once every three months or not at all. °· Patch. The patch must be worn on the lower abdomen for three weeks and then removed on the fourth. °· Vaginal ring. The ring is placed in the vagina and left there for three weeks. It is then removed for one week. ° °Progesterone contraceptives °Contraceptives that use progesterone only are available in these forms: °· Pill. Pills should be taken every day of the cycle. °· Intrauterine device (IUD). This device is inserted into the uterus and removed or replaced every five years or sooner. °· Implant. Plastic rods are placed under the skin of the upper arm. They are removed or replaced every three years or sooner. °· Injection. The injection is given once every 90 days. ° °What are the side effects? °The side effects of estrogen and progesterone contraceptives include: °· Nausea. °· Headaches. °· Breast tenderness. °· Bleeding or spotting between menstrual cycles. °· High  blood pressure (rare). °· Strokes, heart attacks, or blood clots (rare) ° °Side effects of progesterone-only contraceptives include: °· Nausea. °· Headaches. °· Breast tenderness. °· Unpredictable menstrual bleeding. °· High blood pressure (rare). ° °Talk to your health care provider about what side effects may affect you. °Where to find more information: °· Ask your health care provider for more information and resources about hormonal contraception. °· [place] Department of Health and Human Services Office on Women's Health: www.womenshealth.gov °Questions to ask: °· What type of hormonal contraception is right for me? °· How long should I plan to use hormonal contraception? °· What are the side effects of the hormonal contraception method I choose? °· How can I prevent STIs while using hormonal contraception? °Contact a health care provider if: °· You start taking hormonal contraceptives and you develop persistent or severe side effects. °Summary °· Estrogen and progesterone are hormones used in many forms of birth control. °· Talk to your health care provider about what side effects may affect you. °· Hormonal contraception cannot prevent sexually transmitted infections (STIs). °· Ask your health care provider for more information and resources about hormonal contraception. °This information is not intended to replace advice given to you by your health care provider. Make sure you discuss any questions you have with your health care provider. °Document Released: 07/23/2007 Document Revised: 06/02/2016 Document Reviewed: 06/02/2016 °Elsevier Interactive Patient Education © 2018 [employer] Inc[employer] ° °

## 2017-03-29 ENCOUNTER — Encounter: Payer: Self-pay | Admitting: Obstetrics & Gynecology

## 2017-07-17 NOTE — L&D Delivery Note (Signed)
Patient: Erin Jacobs MRN: [id number]  GBS status: Negative, IAP given Amp (banner says GBS+, result from 8/6 cx is neg)   Patient is a 22 y.o. now [id number] s/p NSVD at [redacted]w[redacted]d, who was admitted for SOL. SROM 1h 59m prior to delivery with clear fluid.    Delivery Note At 11:54 AM a viable female was delivered via  (Presentation: vertex).  APGAR: 7, 8; weight pending  .   Placenta status:spontaneous , intact .  Cord: 3 vessel with the following complications: none.     Anesthesia:  None Episiotomy:  None  Lacerations:  1st degree  Suture Repair: hemostatic  Est. Blood Loss (mL):  700 cc   Patient called out to nursing staff that she felt the urge to have a bowel movement. She had a BM in the bed with delivery of the head of newborn. Upon entrance to room, head was delivered vertex and shoulder/body were presenting spontaneously. No nuchal cord present. Assisted with delivery of body in usual fashion. Due to precipitous delivery and non-spontaneous cry, cord was clamped immediately and baby was taken to the warmer.  Placenta delivered spontaneously with gentle cord traction. Pitocin was started. Patient noted to have several large gushes of blood after delivery of placenta. Perineum inspected and found to have first degree laceration, which was found to be hemostatic. Fundus was deviated but fairly firm. I&O cath with small amount of urine drained. Bleeding slowed with fundal massage. Methergine 0.2 mg IM given.    Mom to postpartum.  Baby to Couplet care / Skin to Skin.  Melina Schools 02/21/2018, 12:53 PM

## 2017-09-30 ENCOUNTER — Ambulatory Visit (HOSPITAL_COMMUNITY)
Admission: EM | Admit: 2017-09-30 | Discharge: 2017-09-30 | Disposition: A | Discharge status: Home / Self Care | Payer: Medicaid Other | Attending: Internal Medicine | Admitting: Internal Medicine

## 2017-09-30 ENCOUNTER — Encounter (HOSPITAL_COMMUNITY): Payer: Self-pay | Admitting: Emergency Medicine

## 2017-09-30 DIAGNOSIS — R05 Cough: Secondary | ICD-10-CM

## 2017-09-30 DIAGNOSIS — Z3201 Encounter for pregnancy test, result positive: Secondary | ICD-10-CM

## 2017-09-30 DIAGNOSIS — J302 Other seasonal allergic rhinitis: Secondary | ICD-10-CM | POA: Diagnosis not present

## 2017-09-30 DIAGNOSIS — Z3400 Encounter for supervision of normal first pregnancy, unspecified trimester: Secondary | ICD-10-CM

## 2017-09-30 MED ORDER — VITAFOL ULTRA 29-0.6-0.4-200 MG PO CAPS: 1.0000 | ORAL_CAPSULE | Freq: Every day | ORAL | 4 refills | Status: DC

## 2017-09-30 NOTE — Discharge Instructions (Signed)
For cough, eye itching, and other allergic symptoms take 1 Benadryl 25 mg every 6-8 hours.  Please call Dr. Jodi Mourning to get scheduled for your first OB consult.

## 2017-09-30 NOTE — ED Triage Notes (Signed)
Pt states "I haven't been having a period since September". Pt has been on birth control. Pt c/o cold symptoms x2 weeks.

## 2017-09-30 NOTE — ED Provider Notes (Signed)
09/30/2017 2:44 PM   DOB: [birth year] / MRN: [id number]  SUBJECTIVE:  Erin Jacobs is a 22 y.o. female presenting for 2 complaints.  She complains of cough and eye itching that is been present now for about a month.  She tells me cannot seem to get rid of this cold."  Grandmother did give her some cough syrup however she is not sure what was in the formulation.  She had a baby in August 2018.  Her last period was in September 2018.  She was sporadically using a patch for contraception.  She denies any vaginal bleeding as well as abdominal pain today.  She has No Known Allergies.   She  has a past medical history of Medical history non-contributory.    She  reports that  has never smoked. she has never used smokeless tobacco. She reports that she does not drink alcohol or use drugs. She  reports that she currently engages in sexual activity. She reports using the following method of birth control/protection: None. The patient  has a past surgical history that includes leg suregry- childhood.  Her family history includes Diabetes in her maternal aunt and maternal uncle.  Review of Systems  Constitutional: Negative for chills, diaphoresis and fever.  Respiratory: Negative for cough, hemoptysis, sputum production, shortness of breath and wheezing.   Cardiovascular: Negative for chest pain, orthopnea and leg swelling.  Gastrointestinal: Negative for abdominal pain, blood in stool, constipation, diarrhea, heartburn, melena, nausea and vomiting.  Genitourinary: Negative for flank pain.  Skin: Negative for rash.  Neurological: Negative for dizziness.    OBJECTIVE:  BP 127/64   Pulse 91   Temp 98.1 F (36.7 C)   Resp 18   LMP  (LMP Unknown)   SpO2 100%   Physical Exam  Constitutional: She is active.  Non-toxic appearance.  HENT:  Right Ear: Hearing, tympanic membrane, external ear and ear canal normal.  Left Ear: Hearing, tympanic membrane, external ear and ear canal normal.  Nose:  Nose normal. Right sinus exhibits no maxillary sinus tenderness and no frontal sinus tenderness. Left sinus exhibits no maxillary sinus tenderness and no frontal sinus tenderness.  Mouth/Throat: Uvula is midline, oropharynx is clear and moist and mucous membranes are normal. Mucous membranes are not dry. No oropharyngeal exudate, posterior oropharyngeal edema or tonsillar abscesses.  Cardiovascular: Normal rate, regular rhythm, S1 normal, S2 normal, normal heart sounds and intact distal pulses. Exam reveals no gallop, no friction rub and no decreased pulses.  No murmur heard. Pulmonary/Chest: Effort normal. No stridor. No tachypnea. No respiratory distress. She has no wheezes. She has no rales.  Abdominal: She exhibits no distension.  Musculoskeletal: She exhibits no edema.  Lymphadenopathy:       Head (right side): No submandibular and no tonsillar adenopathy present.       Head (left side): No submandibular and no tonsillar adenopathy present.    She has no cervical adenopathy.  Neurological: She is alert.  Skin: Skin is warm and dry. She is not diaphoretic. No pallor.    No results found for this or any previous visit (from the past 72 hour(s)).  No results found.  ASSESSMENT AND PLAN:  No orders of the defined types were placed in this encounter.    Positive pregnancy test - Negative for vaginal bleeding and abdominal pain.  Testing deferred to OB/GYN.  I am providing the number for Dr. Jodi Mourning who delivered her  last baby.  Seasonal allergies advising that the patient take Benadryl  25 mg every 6-8 hours as needed.  Supervision of normal first pregnancy, antepartum - Plan: Prenat-Fe Poly-Methfol-FA-DHA (VITAFOL ULTRA) 29-0.6-0.4-200 MG CAPS      The patient is advised to call or return to clinic if she does not see an improvement in symptoms, or to seek the care of the closest emergency department if she worsens with the above plan.   Philis Fendt, MHS, PA-C 09/30/2017 2:44  PM    Tereasa Coop, PA-C 09/30/17 1444

## 2017-10-01 LAB — POCT PREGNANCY, URINE: Preg Test, Ur: POSITIVE — AB

## 2017-10-02 ENCOUNTER — Encounter (HOSPITAL_COMMUNITY): Payer: Self-pay | Admitting: *Deleted

## 2017-10-02 ENCOUNTER — Inpatient Hospital Stay (HOSPITAL_COMMUNITY)
Admission: AD | Admit: 2017-10-02 | Discharge: 2017-10-02 | Disposition: A | Discharge status: Home / Self Care | Payer: Medicaid Other | Source: Ambulatory Visit | Attending: Family Medicine | Admitting: Family Medicine

## 2017-10-02 DIAGNOSIS — Z3A25 25 weeks gestation of pregnancy: Secondary | ICD-10-CM | POA: Diagnosis not present

## 2017-10-02 DIAGNOSIS — O0932 Supervision of pregnancy with insufficient antenatal care, second trimester: Secondary | ICD-10-CM | POA: Diagnosis present

## 2017-10-02 NOTE — MAU Note (Signed)
Pt reports positive preg test 2 days ago, called [facility] today and they will not see her until her dates are confirmed per pt.

## 2017-10-02 NOTE — MAU Provider Note (Signed)
preg unknown weeks Fundal height 23 LMP unsure, late September  Patch after menses x 1 month  Chief Complaint:  Possible Pregnancy   None     HPI: Erin Jacobs is a 22 y.o. [phone] at 82w5dwho presents to maternity admissions reporting her LMP was in September after her baby was born in August.  She wore a birth control patch x 1 month starting after her menses and through October but did not have a period when the patch was removed x 1 week. She did not resume the patch or any other form of birth control. She denies any pain or bleeding today. She wants to begin prenatal care with [facility] but was told she needed to know how many weeks she was before making appointment.  She has no other symptoms today.     HPI  Past Medical History: Past Medical History:  Diagnosis Date  . Medical history non-contributory     Past obstetric history: OB History  Gravida Para Term Preterm AB Living  4 2 2  0 1 2  SAB TAB Ectopic Multiple Live Births  1 0 0 0 2    # Outcome Date GA Lbr Len/2nd Weight Sex Delivery Anes PTL Lv  4 Current           3 Term 02/23/17 [redacted]w[redacted]d 14:36 / 00:02 6 lb 15.3 oz (3.155 kg) M Vag-Spont Local  LIV  2 Term 03/10/14 [redacted]w[redacted]d 10:18 / 00:34 6 lb 6.8 oz (2.914 kg) M Vag-Spont EPI  LIV     Birth Comments: WNL, small abraision on roof of mouth  1 SAB 2014 [redacted]w[redacted]d       DEC      Past Surgical History: Past Surgical History:  Procedure Laterality Date  . leg suregry- childhood      Family History: Family History  Problem Relation Age of Onset  . Diabetes Maternal Aunt   . Diabetes Maternal Uncle     Social History: Social History   Tobacco Use  . Smoking status: Never Smoker  . Smokeless tobacco: Never Used  Substance Use Topics  . Alcohol use: No  . Drug use: No    Allergies: No Known Allergies  Meds:  No medications prior to admission.    ROS:  Review of Systems  Constitutional: Negative for chills, fatigue and fever.  Eyes: Negative for visual  disturbance.  Respiratory: Negative for shortness of breath.   Cardiovascular: Negative for chest pain.  Gastrointestinal: Negative for abdominal pain, nausea and vomiting.  Genitourinary: Negative for difficulty urinating, dysuria, flank pain, pelvic pain, vaginal bleeding, vaginal discharge and vaginal pain.  Neurological: Negative for dizziness and headaches.  Psychiatric/Behavioral: Negative.      I have reviewed patient's Past Medical Hx, Surgical Hx, Family Hx, Social Hx, medications and allergies.   Physical Exam   Patient Vitals for the past 24 hrs:  BP Temp Temp src Pulse Resp SpO2 Height Weight  10/02/17 1521 124/79 98.7 F (37.1 C) Oral 94 17 99 % 5\' 7"  (1.702 m) 206 lb (93.4 kg)   Constitutional: Well-developed, well-nourished female in no acute distress.  Cardiovascular: normal rate Respiratory: normal effort GI: Abd soft, non-tender, gravid appropriate for gestational age.  MS: Extremities nontender, no edema, normal ROM Neurologic: Alert and oriented x 4.  GU: Neg CVAT.  PELVIC EXAM: Cervix pink, visually closed, without lesion, scant white creamy discharge, vaginal walls and external genitalia normal Bimanual exam: Cervix 0/long/high, firm, anterior, neg CMT, uterus nontender, nonenlarged, adnexa without tenderness,  enlargement, or mass     FHT: 154 by doppler   Labs: No results found for this or any previous visit (from the past 24 hour(s)). --/--/B POS (08/10 0930)  Imaging:  No results found.  MAU Course/MDM: FHT wnl, no abdominal pain or bleeding, unsure pregnancy dates so no NST collected Fundal height 2-3 cm above umbilicus, c/w 75-10 weeks, pt is 25 weeks by LMP Pregnancy verification letter provided Outpatient anatomy US ordered F/U with prenatal care as soon as possible   Assessment: 1. Late prenatal care affecting pregnancy in second trimester   2. [redacted] weeks gestation of pregnancy     Plan: Discharge home Labor precautions and fetal  kick counts Follow-up Information    [facility]. Schedule an appointment as soon as possible for a visit.   Contact information: [place] [id number] [phone]         Allergies as of 10/02/2017   No Known Allergies     Medication List    TAKE these medications   acetaminophen 500 MG tablet Commonly known as:  TYLENOL Take 1,000 mg by mouth every 6 (six) hours as needed for mild pain, moderate pain or headache.   VITAFOL ULTRA 29-0.6-0.4-200 MG Caps Take 1 capsule by mouth daily before breakfast.       Fatima Blank Certified Nurse-Midwife 10/02/2017 9:52 PM

## 2017-10-05 ENCOUNTER — Other Ambulatory Visit: Payer: Self-pay | Admitting: Obstetrics and Gynecology

## 2017-10-05 DIAGNOSIS — Z3490 Encounter for supervision of normal pregnancy, unspecified, unspecified trimester: Secondary | ICD-10-CM

## 2017-10-12 DIAGNOSIS — Z3492 Encounter for supervision of normal pregnancy, unspecified, second trimester: Secondary | ICD-10-CM | POA: Insufficient documentation

## 2017-10-15 ENCOUNTER — Encounter: Payer: Medicaid Other | Admitting: Obstetrics

## 2017-10-16 ENCOUNTER — Telehealth: Payer: Self-pay | Admitting: *Deleted

## 2017-10-16 NOTE — Telephone Encounter (Signed)
Several phone calls attempted to gove patient a New Ob appt and to reschedule missed appt .on 10/15/2017, no answer, phone number on system unable to receive calls.Marland KitchenMarland Kitchen

## 2017-10-18 ENCOUNTER — Other Ambulatory Visit: Payer: Self-pay | Admitting: Obstetrics and Gynecology

## 2017-10-18 ENCOUNTER — Ambulatory Visit (HOSPITAL_COMMUNITY)
Admission: RE | Admit: 2017-10-18 | Discharge: 2017-10-18 | Disposition: A | Discharge status: Home / Self Care | Payer: Medicaid Other | Source: Ambulatory Visit | Attending: Obstetrics and Gynecology | Admitting: Obstetrics and Gynecology

## 2017-10-18 DIAGNOSIS — Z3687 Encounter for antenatal screening for uncertain dates: Secondary | ICD-10-CM

## 2017-10-18 DIAGNOSIS — O26842 Uterine size-date discrepancy, second trimester: Secondary | ICD-10-CM | POA: Diagnosis not present

## 2017-10-18 DIAGNOSIS — Z363 Encounter for antenatal screening for malformations: Secondary | ICD-10-CM

## 2017-10-18 DIAGNOSIS — O0932 Supervision of pregnancy with insufficient antenatal care, second trimester: Secondary | ICD-10-CM

## 2017-10-18 DIAGNOSIS — Z3A28 28 weeks gestation of pregnancy: Secondary | ICD-10-CM

## 2017-10-18 DIAGNOSIS — Z3A22 22 weeks gestation of pregnancy: Secondary | ICD-10-CM

## 2017-10-18 DIAGNOSIS — O0933 Supervision of pregnancy with insufficient antenatal care, third trimester: Secondary | ICD-10-CM

## 2017-10-18 DIAGNOSIS — Z3490 Encounter for supervision of normal pregnancy, unspecified, unspecified trimester: Secondary | ICD-10-CM

## 2017-10-23 ENCOUNTER — Other Ambulatory Visit (HOSPITAL_COMMUNITY)
Admission: RE | Admit: 2017-10-23 | Discharge: 2017-10-23 | Disposition: A | Discharge status: Home / Self Care | Payer: Medicaid Other | Source: Ambulatory Visit | Attending: Obstetrics | Admitting: Obstetrics

## 2017-10-23 ENCOUNTER — Encounter: Payer: Self-pay | Admitting: Certified Nurse Midwife

## 2017-10-23 ENCOUNTER — Ambulatory Visit (INDEPENDENT_AMBULATORY_CARE_PROVIDER_SITE_OTHER): Payer: Medicaid Other | Admitting: Certified Nurse Midwife

## 2017-10-23 VITALS — BP 121/72 | HR 97 | Wt 204.0 lb

## 2017-10-23 DIAGNOSIS — Z283 Underimmunization status: Secondary | ICD-10-CM

## 2017-10-23 DIAGNOSIS — Z3482 Encounter for supervision of other normal pregnancy, second trimester: Secondary | ICD-10-CM | POA: Diagnosis not present

## 2017-10-23 DIAGNOSIS — Z3480 Encounter for supervision of other normal pregnancy, unspecified trimester: Secondary | ICD-10-CM | POA: Diagnosis present

## 2017-10-23 DIAGNOSIS — O09899 Supervision of other high risk pregnancies, unspecified trimester: Secondary | ICD-10-CM

## 2017-10-23 DIAGNOSIS — Z2839 Other underimmunization status: Secondary | ICD-10-CM

## 2017-10-23 DIAGNOSIS — Q541 Hypospadias, penile: Secondary | ICD-10-CM

## 2017-10-23 HISTORY — DX: Hypospadias, penile: Q54.1

## 2017-10-23 MED ORDER — OB COMPLETE PETITE 35-5-1-200 MG PO CAPS: 1.0000 | ORAL_CAPSULE | Freq: Every day | ORAL | 12 refills | Status: DC

## 2017-10-23 NOTE — Progress Notes (Signed)
Subjective:   Erin Jacobs is a 22 y.o. [id number] at [redacted]w[redacted]d by midtrimester ultrasound being seen today for her first obstetrical visit.  Her obstetrical history is significant for close spaced pregnancies. Patient does intend to breast feed. Pregnancy history fully reviewed.  Patient reports no complaints.  HISTORY: OB History  Gravida Para Term Preterm AB Living  4 2 2  0 1 2  SAB TAB Ectopic Multiple Live Births  1 0 0 0 2    # Outcome Date GA Lbr Len/2nd Weight Sex Delivery Anes PTL Lv  4 Current           3 Term 02/23/17 [redacted]w[redacted]d 14:36 / 00:02 6 lb 15.3 oz (3.155 kg) M Vag-Spont Local  LIV     Name: Jacobs,BOY Erin     Apgar1: 9  Apgar5: 9  2 Term 03/10/14 [redacted]w[redacted]d 10:18 / 00:34 6 lb 6.8 oz (2.914 kg) M Vag-Spont EPI  LIV     Birth Comments: WNL, small abraision on roof of mouth     Name: Jacobs,BOY Erin     Apgar1: 8  Apgar5: 9  1 SAB 2014 [redacted]w[redacted]d       DEC    Last pap smear was done 2018 and was normal  Past Medical History:  Diagnosis Date  . Medical history non-contributory    Past Surgical History:  Procedure Laterality Date  . leg suregry- childhood     Family History  Problem Relation Age of Onset  . Diabetes Maternal Aunt   . Diabetes Maternal Uncle    Social History   Tobacco Use  . Smoking status: Never Smoker  . Smokeless tobacco: Never Used  Substance Use Topics  . Alcohol use: No  . Drug use: No   No Known Allergies Current Outpatient Medications on File Prior to Visit  Medication Sig Dispense Refill  . acetaminophen (TYLENOL) 500 MG tablet Take 1,000 mg by mouth every 6 (six) hours as needed for mild pain, moderate pain or headache.    . Prenat-Fe Poly-Methfol-FA-DHA (VITAFOL ULTRA) 29-0.6-0.4-200 MG CAPS Take 1 capsule by mouth daily before breakfast. (Patient not taking: Reported on 10/23/2017) 90 capsule 4   No current facility-administered medications on file prior to visit.     Review of Systems Pertinent items noted in HPI and  remainder of comprehensive ROS otherwise negative.  Exam   Vitals:   10/23/17 1406  BP: 121/72  Pulse: 97  Weight: 204 lb (92.5 kg)   Fetal Heart Rate (bpm): 150; doppler  Uterus:  Fundal Height: 23 cm  Pelvic Exam: Perineum: no hemorrhoids, normal perineum   Vulva: normal external genitalia, no lesions   Vagina:  normal mucosa, normal discharge   Cervix: no CMT, OS closed    Adnexa: normal adnexa and no mass, fullness, tenderness   Bony Pelvis: average  System: General: well-developed, well-nourished female in no acute distress   Breast:  normal appearance, no masses or tenderness   Skin: normal coloration and turgor, no rashes   Neurologic: oriented, normal, negative, normal mood   Extremities: normal strength, tone, and muscle mass, ROM of all joints is normal   HEENT PERRLA, extraocular movement intact and sclera clear, anicteric   Mouth/Teeth mucous membranes moist, pharynx normal without lesions and dental hygiene good   Neck supple and no masses   Cardiovascular: regular rate and rhythm   Respiratory:  no respiratory distress, normal breath sounds   Abdomen: soft, non-tender; bowel sounds normal; no masses,  no  organomegaly     Assessment:   Pregnancy: [id number] Patient Active Problem List   Diagnosis Date Noted  . Maternal varicella, non-immune 10/23/2017  . Familial hypospadias of penis 10/23/2017  . Supervision of normal pregnancy in second trimester 10/12/2017     Plan:  1. Supervision of other normal pregnancy, antepartum     - [place] MFM OB FOLLOW UP; Future - Obstetric Panel, Including HIV - Hemoglobin A1c - TSH Pregnancy - Cystic Fibrosis Mutation 97 - Genetic Screening - Cervicovaginal ancillary only - Culture, OB Urine - Vitamin D (25 hydroxy)  2. Encounter for supervision of other normal pregnancy in second trimester    - Prenat-FeCbn-FeAspGl-FA-Omega (OB COMPLETE PETITE) 35-5-1-200 MG CAPS; Take 1 tablet by mouth daily.  Dispense: 30 capsule;  Refill: 12  3. Maternal varicella, non-immune     Varicella postpartum.    Initial labs drawn. Continue prenatal vitamins. Genetic Screening discussed, NIPS: ordered. Ultrasound discussed; fetal anatomic survey: ordered. Problem list reviewed and updated. The nature of [place] with multiple MDs and other Advanced Practice Providers was explained to patient; also emphasized that residents, students are part of our team. Routine obstetric precautions reviewed. Return in about 1 month (around 11/20/2017) for ROB, 2 hr OGTT.     Kandis Cocking, [facility], [place]

## 2017-10-23 NOTE — Patient Instructions (Signed)
Before [facility] Before your baby arrives it is important to:  Have all of the supplies that you will need to care for your baby.  Know where to go if there is an emergency.  Discuss the baby's arrival with other family members.  What supplies will I need?  It is recommended that you have the following supplies: Large Items  Crib.  Crib mattress.  Rear-facing infant car seat. If possible, have a trained professional check to make sure that it is installed correctly.  Feeding  6-8 bottles that are 4-5 oz in size.  6-8 nipples.  Bottle brush.  Sterilizer, or a large pan or kettle with a lid.  A way to boil and cool water.  If you will be breastfeeding: ? Breast pump. ? Nipple cream. ? Nursing bra. ? Breast pads. ? Breast shields.  If you will be formula feeding: ? Formula. ? Measuring cups. ? Measuring spoons.  Bathing  Mild baby soap and baby shampoo.  Petroleum jelly.  Soft cloth towel and washcloth.  Hooded towel.  Cotton balls.  Bath basin.  Other Supplies  Rectal thermometer.  Bulb syringe.  Baby wipes or washcloths for diaper changes.  Diaper bag.  Changing pad.  Clothing, including one-piece outfits and pajamas.  Baby nail clippers.  Receiving blankets.  Mattress pad and sheets for the crib.  Night-light for the baby's room.  Baby monitor.  2 or 3 pacifiers.  Either 24-36 cloth diapers and waterproof diaper covers or a box of disposable diapers. You may need to use as many as 10-12 diapers per day.  How do I prepare for an emergency? Prepare for an emergency by:  Knowing how to get to the nearest hospital.  Listing the phone numbers of your baby's health care providers near your home phone and in your cell phone.  How do I prepare my family?  Decide how to handle visitors.  If you have other children: ? Talk with them about the baby coming home. Ask them how they feel about it. ? Read a book together about  being a new big brother or sister. ? Find ways to let them help you prepare for the new baby. ? Have someone ready to care for them while you are in the hospital. This information is not intended to replace advice given to you by your health care provider. Make sure you discuss any questions you have with your health care provider. Document Released: 06/15/2008 Document Revised: 12/09/2015 Document Reviewed: 06/10/2014 Elsevier Interactive Patient Education  2018 [place] of Pregnancy The second trimester is from week 13 through week 28, month 4 through 6. This is often the time in pregnancy that you feel your best. Often times, morning sickness has lessened or quit. You may have more energy, and you may get hungry more often. Your unborn baby (fetus) is growing rapidly. At the end of the sixth month, he or she is about 9 inches long and weighs about 1 pounds. You will likely feel the baby move (quickening) between 18 and 20 weeks of pregnancy. Follow these instructions at home:  Avoid all smoking, herbs, and alcohol. Avoid drugs not approved by your doctor.  Do not use any tobacco products, including cigarettes, chewing tobacco, and electronic cigarettes. If you need help quitting, ask your doctor. You may get counseling or other support to help you quit.  Only take medicine as told by your doctor. Some medicines are safe and some are not  during pregnancy.  Exercise only as told by your doctor. Stop exercising if you start having cramps.  Eat regular, healthy meals.  Wear a good support bra if your breasts are tender.  Do not use hot tubs, steam rooms, or saunas.  Wear your seat belt when driving.  Avoid raw meat, uncooked cheese, and liter boxes and soil used by cats.  Take your prenatal vitamins.  Take 1500-2000 milligrams of calcium daily starting at the 20th week of pregnancy until you deliver your baby.  Try taking medicine that helps you poop (stool  softener) as needed, and if your doctor approves. Eat more fiber by eating fresh fruit, vegetables, and whole grains. Drink enough fluids to keep your pee (urine) clear or pale yellow.  Take warm water baths (sitz baths) to soothe pain or discomfort caused by hemorrhoids. Use hemorrhoid cream if your doctor approves.  If you have puffy, bulging veins (varicose veins), wear support hose. Raise (elevate) your feet for 15 minutes, 3-4 times a day. Limit salt in your diet.  Avoid heavy lifting, wear low heals, and sit up straight.  Rest with your legs raised if you have leg cramps or low back pain.  Visit your dentist if you have not gone during your pregnancy. Use a soft toothbrush to brush your teeth. Be gentle when you floss.  You can have sex (intercourse) unless your doctor tells you not to.  Go to your doctor visits. Get help if:  You feel dizzy.  You have mild cramps or pressure in your lower belly (abdomen).  You have a nagging pain in your belly area.  You continue to feel sick to your stomach (nauseous), throw up (vomit), or have watery poop (diarrhea).  You have bad smelling fluid coming from your vagina.  You have pain with peeing (urination). Get help right away if:  You have a fever.  You are leaking fluid from your vagina.  You have spotting or bleeding from your vagina.  You have severe belly cramping or pain.  You lose or gain weight rapidly.  You have trouble catching your breath and have chest pain.  You notice sudden or extreme puffiness (swelling) of your face, hands, ankles, feet, or legs.  You have not felt the baby move in over an hour.  You have severe headaches that do not go away with medicine.  You have vision changes. This information is not intended to replace advice given to you by your health care provider. Make sure you discuss any questions you have with your health care provider. Document Released: 09/27/2009 Document Revised:  12/09/2015 Document Reviewed: 09/03/2012 Elsevier Interactive Patient Education  2017 [place]. Glucose Tolerance Test During Pregnancy The glucose tolerance test (GTT) is a blood test used to determine if you have developed a type of diabetes during pregnancy (gestational diabetes). This is when your body does not properly process sugar (glucose) in the food you eat, resulting in high blood glucose levels. Typically, a GTT is done after you have had a 1-hour glucose test with results that indicate you possibly have gestational diabetes. It may also be done if:  You have a history of giving birth to very large babies or have experienced repeated fetal loss (stillbirth).  You have signs and symptoms of diabetes, such as: ? Changes in your vision. ? Tingling or numbness in your hands or feet. ? Changes in hunger, thirst, and urination not otherwise explained by your pregnancy.  The GTT lasts about 3 hours.  You will be given a sugar-water solution to drink at the beginning of the test. You will have blood drawn before you drink the solution and then again 1, 2, and 3 hours after you drink it. You will not be allowed to eat or drink anything else during the test. You must remain at the testing location to make sure that your blood is drawn on time. You should also avoid exercising during the test, because exercise can alter test results. How do I prepare for this test? Eat normally for 3 days prior to the GTT test, including having plenty of carbohydrate-rich foods. Do not eat or drink anything except water during the final 12 hours before the test. In addition, your health care provider may ask you to stop taking certain medicines before the test. What do the results mean? It is your responsibility to obtain your test results. Ask the lab or department performing the test when and how you will get your results. Contact your health care provider to discuss any questions you have about your  results. Range of Normal Values Ranges for normal values may vary among different labs and hospitals. You should always check with your health care provider after having lab work or other tests done to discuss whether your values are considered within normal limits. Normal levels of blood glucose are as follows:  Fasting: less than 105 mg/dL.  1 hour after drinking the solution: less than 190 mg/dL.  2 hours after drinking the solution: less than 165 mg/dL.  3 hours after drinking the solution: less than 145 mg/dL.  Some substances can interfere with GTT results. These may include:  Blood pressure and heart failure medicines, including beta blockers, furosemide, and thiazides.  Anti-inflammatory medicines, including aspirin.  Nicotine.  Some psychiatric medicines.  Meaning of Results Outside Normal Value Ranges GTT test results that are above normal values may indicate a number of health problems, such as:  Gestational diabetes.  Acute stress response.  Cushing syndrome.  Tumors such as pheochromocytoma or glucagonoma.  Long-term kidney problems.  Pancreatitis.  Hyperthyroidism.  Current infection.  Discuss your test results with your health care provider. He or she will use the results to make a diagnosis and determine a treatment plan that is right for you. This information is not intended to replace advice given to you by your health care provider. Make sure you discuss any questions you have with your health care provider. Document Released: 01/02/2012 Document Revised: 12/09/2015 Document Reviewed: 11/07/2013 Elsevier Interactive Patient Education  [employer].

## 2017-10-24 ENCOUNTER — Other Ambulatory Visit: Payer: Self-pay | Admitting: Certified Nurse Midwife

## 2017-10-24 DIAGNOSIS — E559 Vitamin D deficiency, unspecified: Secondary | ICD-10-CM | POA: Insufficient documentation

## 2017-10-24 LAB — TSH PREGNANCY: TSH Pregnancy: 0.962 u[IU]/mL (ref 0.450–4.500)

## 2017-10-24 LAB — OBSTETRIC PANEL, INCLUDING HIV
Antibody Screen: NEGATIVE
Basophils Absolute: 0 10*3/uL (ref 0.0–0.2)
Basos: 0 %
EOS (ABSOLUTE): 0.3 10*3/uL (ref 0.0–0.4)
Eos: 4 %
HIV Screen 4th Generation wRfx: NONREACTIVE
Hematocrit: 29.8 % — ABNORMAL LOW (ref 34.0–46.6)
Hemoglobin: 9.4 g/dL — ABNORMAL LOW (ref 11.1–15.9)
Hepatitis B Surface Ag: NEGATIVE
IMMATURE GRANS (ABS): 0 10*3/uL (ref 0.0–0.1)
Immature Granulocytes: 0 %
LYMPHS: 32 %
Lymphocytes Absolute: 2.5 10*3/uL (ref 0.7–3.1)
MCH: 23.6 pg — ABNORMAL LOW (ref 26.6–33.0)
MCHC: 31.5 g/dL (ref 31.5–35.7)
MCV: 75 fL — ABNORMAL LOW (ref 79–97)
MONOS ABS: 0.8 10*3/uL (ref 0.1–0.9)
Monocytes: 10 %
Neutrophils Absolute: 4.4 10*3/uL (ref 1.4–7.0)
Neutrophils: 54 %
Platelets: 462 10*3/uL — ABNORMAL HIGH (ref 150–379)
RBC: 3.99 x10E6/uL (ref 3.77–5.28)
RDW: 17.8 % — AB (ref 12.3–15.4)
RPR Ser Ql: NONREACTIVE
Rh Factor: POSITIVE
Rubella Antibodies, IGG: 10.7 index (ref >0.99)
WBC: 8 10*3/uL (ref 3.4–10.8)

## 2017-10-24 LAB — VITAMIN D 25 HYDROXY (VIT D DEFICIENCY, FRACTURES): Vit D, 25-Hydroxy: 10.2 ng/mL — ABNORMAL LOW (ref 30.0–100.0)

## 2017-10-24 LAB — CERVICOVAGINAL ANCILLARY ONLY
BACTERIAL VAGINITIS: POSITIVE — AB
Candida vaginitis: NEGATIVE
Chlamydia: NEGATIVE
Neisseria Gonorrhea: NEGATIVE
TRICH (WINDOWPATH): NEGATIVE

## 2017-10-24 LAB — HEMOGLOBIN A1C
Est. average glucose Bld gHb Est-mCnc: 103 mg/dL
HEMOGLOBIN A1C: 5.2 % (ref 4.8–5.6)

## 2017-10-24 MED ORDER — VITAMIN D (ERGOCALCIFEROL) 1.25 MG (50000 UNIT) PO CAPS: 50000.0000 [IU] | ORAL_CAPSULE | ORAL | 2 refills | Status: DC

## 2017-10-25 ENCOUNTER — Other Ambulatory Visit: Payer: Self-pay | Admitting: Certified Nurse Midwife

## 2017-10-25 ENCOUNTER — Telehealth: Payer: Self-pay

## 2017-10-25 DIAGNOSIS — B9689 Other specified bacterial agents as the cause of diseases classified elsewhere: Secondary | ICD-10-CM

## 2017-10-25 DIAGNOSIS — O99012 Anemia complicating pregnancy, second trimester: Secondary | ICD-10-CM

## 2017-10-25 DIAGNOSIS — N760 Acute vaginitis: Principal | ICD-10-CM

## 2017-10-25 MED ORDER — CITRANATAL BLOOM 90-1 MG PO TABS: 1.0000 | ORAL_TABLET | Freq: Every day | ORAL | 12 refills | Status: DC

## 2017-10-25 MED ORDER — SECNIDAZOLE 2 G PO PACK: 1.0000 | PACK | Freq: Once | ORAL | 0 refills | Status: AC

## 2017-10-25 NOTE — Telephone Encounter (Signed)
-----   Message from Morene Crocker, CNM sent at 10/25/2017  8:34 AM EDT ----- Please let her know that she is anemic.  I have added bloom to her other PNV.  Thank you. R.Denney CNM  Please let her know that she has bacterial vaginosis.  I have sent Solosec to the pharmacy for her to take in the AM with food, 1 packet dose. She has failed Flagyl. Thank you.

## 2017-10-25 NOTE — Progress Notes (Signed)
Left message to call office/pick up Rx at Pharmacy.

## 2017-10-25 NOTE — Telephone Encounter (Signed)
Left VM message to call office/pick up Rx at Pharmacy.

## 2017-10-26 ENCOUNTER — Telehealth: Payer: Self-pay

## 2017-10-26 NOTE — Telephone Encounter (Signed)
Left VM message to call the office and pick up Rx at her pharmacy.

## 2017-10-26 NOTE — Telephone Encounter (Signed)
-----   Message from Morene Crocker, CNM sent at 10/25/2017  8:34 AM EDT ----- Please let her know that she is anemic.  I have added bloom to her other PNV.  Thank you. R.Denney CNM  Please let her know that she has bacterial vaginosis.  I have sent Solosec to the pharmacy for her to take in the AM with food, 1 packet dose. She has failed Flagyl. Thank you.

## 2017-10-30 ENCOUNTER — Other Ambulatory Visit: Payer: Self-pay | Admitting: Certified Nurse Midwife

## 2017-10-30 DIAGNOSIS — Z3482 Encounter for supervision of other normal pregnancy, second trimester: Secondary | ICD-10-CM

## 2017-10-30 LAB — CYSTIC FIBROSIS MUTATION 97: Interpretation: NOT DETECTED

## 2017-10-31 ENCOUNTER — Other Ambulatory Visit: Payer: Self-pay | Admitting: Certified Nurse Midwife

## 2017-10-31 DIAGNOSIS — Z3482 Encounter for supervision of other normal pregnancy, second trimester: Secondary | ICD-10-CM

## 2017-11-08 ENCOUNTER — Encounter (HOSPITAL_COMMUNITY): Payer: Self-pay

## 2017-11-08 ENCOUNTER — Inpatient Hospital Stay (HOSPITAL_COMMUNITY)
Admission: AD | Admit: 2017-11-08 | Discharge: 2017-11-08 | Disposition: A | Discharge status: Home / Self Care | Payer: Medicaid Other | Source: Ambulatory Visit | Attending: Obstetrics & Gynecology | Admitting: Obstetrics & Gynecology

## 2017-11-08 DIAGNOSIS — O98812 Other maternal infectious and parasitic diseases complicating pregnancy, second trimester: Secondary | ICD-10-CM

## 2017-11-08 DIAGNOSIS — Z3A25 25 weeks gestation of pregnancy: Secondary | ICD-10-CM | POA: Insufficient documentation

## 2017-11-08 DIAGNOSIS — B373 Candidiasis of vulva and vagina: Secondary | ICD-10-CM | POA: Diagnosis not present

## 2017-11-08 DIAGNOSIS — O98819 Other maternal infectious and parasitic diseases complicating pregnancy, unspecified trimester: Secondary | ICD-10-CM

## 2017-11-08 DIAGNOSIS — L298 Other pruritus: Secondary | ICD-10-CM | POA: Diagnosis present

## 2017-11-08 DIAGNOSIS — B3731 Acute candidiasis of vulva and vagina: Secondary | ICD-10-CM

## 2017-11-08 LAB — URINALYSIS, ROUTINE W REFLEX MICROSCOPIC
Bilirubin Urine: NEGATIVE
Glucose, UA: NEGATIVE mg/dL
KETONES UR: NEGATIVE mg/dL
NITRITE: NEGATIVE
Protein, ur: NEGATIVE mg/dL
SPECIFIC GRAVITY, URINE: 1.019 (ref 1.005–1.030)
pH: 5 (ref 5.0–8.0)

## 2017-11-08 LAB — WET PREP, GENITAL
CLUE CELLS WET PREP: NONE SEEN
Sperm: NONE SEEN
TRICH WET PREP: NONE SEEN

## 2017-11-08 MED ORDER — TERCONAZOLE 0.4 % VA CREA: 1.0000 | TOPICAL_CREAM | Freq: Every day | VAGINAL | 0 refills | Status: DC

## 2017-11-08 NOTE — MAU Provider Note (Signed)
History     CSN: [id number]  Arrival date and time: 11/08/17 9024   First Provider Initiated Contact with Patient 11/08/17 1116     Chief Complaint  Patient presents with  . Vaginal Itching   HPI Erin Jacobs is a 22 y.o. [id number] at [redacted]w[redacted]d who presents with vaginal itching. She states it started yesterday and she also has a thick white discharge. She denies any leaking of fluid or vaginal bleeding. Denies any pain. Reports good fetal movement. She gets prenatal care at [facility].   OB History    Gravida  4   Para  2   Term  2   Preterm  0   AB  1   Living  2     SAB  1   TAB  0   Ectopic  0   Multiple  0   Live Births  2           Past Medical History:  Diagnosis Date  . Medical history non-contributory     Past Surgical History:  Procedure Laterality Date  . leg suregry- childhood      Family History  Problem Relation Age of Onset  . Diabetes Maternal Aunt   . Diabetes Maternal Uncle     Social History   Tobacco Use  . Smoking status: Never Smoker  . Smokeless tobacco: Never Used  Substance Use Topics  . Alcohol use: No  . Drug use: No    Allergies: No Known Allergies  Medications Prior to Admission  Medication Sig Dispense Refill Last Dose  . Prenat-Fe Poly-Methfol-FA-DHA (VITAFOL ULTRA) 29-0.6-0.4-200 MG CAPS Take 1 capsule by mouth daily before breakfast. (Patient not taking: Reported on 10/23/2017) 90 capsule 4 Not Taking at Unknown time  . Prenat-FeCbn-FeAspGl-FA-Omega (OB COMPLETE PETITE) 35-5-1-200 MG CAPS Take 1 tablet by mouth daily. (Patient not taking: Reported on 11/08/2017) 30 capsule 12 Not Taking at Unknown time  . Prenatal-DSS-FeCb-FeGl-FA (CITRANATAL BLOOM) 90-1 MG TABS Take 1 tablet by mouth daily. (Patient not taking: Reported on 11/08/2017) 30 tablet 12 Not Taking at Unknown time  . Vitamin D, Ergocalciferol, (DRISDOL) 50000 units CAPS capsule Take 1 capsule (50,000 Units total) by mouth every 7 (seven) days. (Patient not  taking: Reported on 11/08/2017) 30 capsule 2 Not Taking at Unknown time    Review of Systems  Constitutional: Negative.  Negative for fatigue and fever.  HENT: Negative.   Respiratory: Negative.  Negative for shortness of breath.   Cardiovascular: Negative.  Negative for chest pain.  Gastrointestinal: Negative.  Negative for abdominal pain, constipation, diarrhea, nausea and vomiting.  Genitourinary: Positive for vaginal discharge. Negative for dysuria.  Neurological: Negative.  Negative for dizziness and headaches.   Physical Exam   Blood pressure 112/60, pulse 93, temperature 97.9 F (36.6 C), temperature source Oral, resp. rate 16, weight 208 lb (94.3 kg), last menstrual period 04/05/2017, SpO2 97 %, unknown if currently breastfeeding.  Physical Exam  Nursing note and vitals reviewed. Constitutional: She is oriented to person, place, and time. She appears well-developed and well-nourished. No distress.  HENT:  Head: Normocephalic.  Eyes: Pupils are equal, round, and reactive to light.  Cardiovascular: Normal rate, regular rhythm and normal heart sounds.  Respiratory: Effort normal and breath sounds normal. No respiratory distress.  GI: Soft. Bowel sounds are normal. She exhibits no distension. There is no tenderness.  Genitourinary:  Genitourinary Comments: Copious thick, white adherent discharge on vaginal walls and cervix.   Neurological: She is alert and oriented  to person, place, and time.  Skin: Skin is warm and dry.  Psychiatric: She has a normal mood and affect. Her behavior is normal. Judgment and thought content normal.   Fetal Tracing:  Baseline: 150 Variability: moderate Accels: 10x10 Decels: none  Toco: none   MAU Course  Procedures Results for orders placed or performed during the hospital encounter of 11/08/17 (from the past 24 hour(s))  Urinalysis, Routine w reflex microscopic     Status: Abnormal   Collection Time: 11/08/17  9:01 AM  Result Value Ref  Range   Color, Urine YELLOW YELLOW   APPearance CLOUDY (A) CLEAR   Specific Gravity, Urine 1.019 1.005 - 1.030   pH 5.0 5.0 - 8.0   Glucose, UA NEGATIVE NEGATIVE mg/dL   Hgb urine dipstick SMALL (A) NEGATIVE   Bilirubin Urine NEGATIVE NEGATIVE   Ketones, ur NEGATIVE NEGATIVE mg/dL   Protein, ur NEGATIVE NEGATIVE mg/dL   Nitrite NEGATIVE NEGATIVE   Leukocytes, UA LARGE (A) NEGATIVE   RBC / HPF 21-50 0 - 5 RBC/hpf   WBC, UA >50 (H) 0 - 5 WBC/hpf   Bacteria, UA RARE (A) NONE SEEN   Squamous Epithelial / LPF 21-50 0 - 5   Mucus PRESENT    Budding Yeast PRESENT   Wet prep, genital     Status: Abnormal   Collection Time: 11/08/17 11:17 AM  Result Value Ref Range   Yeast Wet Prep HPF POC PRESENT (A) NONE SEEN   Trich, Wet Prep NONE SEEN NONE SEEN   Clue Cells Wet Prep HPF POC NONE SEEN NONE SEEN   WBC, Wet Prep HPF POC MANY (A) NONE SEEN   Sperm NONE SEEN    MDM UA Wet prep and gc/chlamydia  Assessment and Plan   1. Candidiasis of vagina during pregnancy   2. [redacted] weeks gestation of pregnancy    -Discharge home in stable condition -Rx for terazol sent to patient's pharmacy -Safe medications in pregnancy list discussed -Patient advised to follow-up with [facility] as scheduled for prenatal care -Patient may return to MAU as needed or if her condition were to change or worsen  Wende Mott CNM 11/08/2017, 11:19 AM

## 2017-11-08 NOTE — MAU Note (Signed)
Pt has been having vaginal itching and some discharge. No bleeding or LOF.

## 2017-11-08 NOTE — Discharge Instructions (Signed)
Vaginal Yeast infection, Adult °Vaginal yeast infection is a condition that causes soreness, swelling, and redness (inflammation) of the vagina. It also causes vaginal discharge. This is a common condition. Some women get this infection frequently. °What are the causes? °This condition is caused by a change in the normal balance of the yeast (candida) and bacteria that live in the vagina. This change causes an overgrowth of yeast, which causes the inflammation. °What increases the risk? °This condition is more likely to develop in: °· Women who take antibiotic medicines. °· Women who have diabetes. °· Women who take birth control pills. °· Women who are pregnant. °· Women who douche often. °· Women who have a weak defense (immune) system. °· Women who have been taking steroid medicines for a long time. °· Women who frequently wear tight clothing. ° °What are the signs or symptoms? °Symptoms of this condition include: °· White, thick vaginal discharge. °· Swelling, itching, redness, and irritation of the vagina. The lips of the vagina (vulva) may be affected as well. °· Pain or a burning feeling while urinating. °· Pain during sex. ° °How is this diagnosed? °This condition is diagnosed with a medical history and physical exam. This will include a pelvic exam. Your health care provider will examine a sample of your vaginal discharge under a microscope. Your health care provider may send this sample for testing to confirm the diagnosis. °How is this treated? °This condition is treated with medicine. Medicines may be over-the-counter or prescription. You may be told to use one or more of the following: °· Medicine that is taken orally. °· Medicine that is applied as a cream. °· Medicine that is inserted directly into the vagina (suppository). ° °Follow these instructions at home: °· Take or apply over-the-counter and prescription medicines only as told by your health care provider. °· Do not have sex until your health  care provider has approved. Tell your sex partner that you have a yeast infection. That person should go to his or her health care provider if he or she develops symptoms. °· Do not wear tight clothes, such as pantyhose or tight pants. °· Avoid using tampons until your health care provider approves. °· Eat more yogurt. This may help to keep your yeast infection from returning. °· Try taking a sitz bath to help with discomfort. This is a warm water bath that is taken while you are sitting down. The water should only come up to your hips and should cover your buttocks. Do this 3-4 times per day or as told by your health care provider. °· Do not douche. °· Wear breathable, cotton underwear. °· If you have diabetes, keep your blood sugar levels under control. °Contact a health care provider if: °· You have a fever. °· Your symptoms go away and then return. °· Your symptoms do not get better with treatment. °· Your symptoms get worse. °· You have new symptoms. °· You develop blisters in or around your vagina. °· You have blood coming from your vagina and it is not your menstrual period. °· You develop pain in your abdomen. °This information is not intended to replace advice given to you by your health care provider. Make sure you discuss any questions you have with your health care provider. °Document Released: 04/12/2005 Document Revised: 12/15/2015 Document Reviewed: 01/04/2015 °Elsevier Interactive Patient Education © 2018 [employer] ° °Safe Medications in Pregnancy  ° °Acne: °Benzoyl Peroxide °Salicylic Acid ° °Backache/Headache: °Tylenol: 2 regular strength every 4 hours   OR °             2 Extra strength every 6 hours ° °Colds/Coughs/Allergies: °Benadryl (alcohol free) 25 mg every 6 hours as needed °Breath right strips °Claritin °Cepacol throat lozenges °Chloraseptic throat spray °Cold-Eeze- up to three times per day °Cough drops, alcohol free °Flonase (by prescription only) °Guaifenesin °Mucinex °Robitussin DM  (plain only, alcohol free) °Saline nasal spray/drops °Sudafed (pseudoephedrine) & Actifed ** use only after [redacted] weeks gestation and if you do not have high blood pressure °Tylenol °Vicks Vaporub °Zinc lozenges °Zyrtec  ° °Constipation: °Colace °Ducolax suppositories °Fleet enema °Glycerin suppositories °Metamucil °Milk of magnesia °Miralax °Senokot °Smooth move tea ° °Diarrhea: °Kaopectate °Imodium A-D ° °*NO pepto Bismol ° °Hemorrhoids: °Anusol °Anusol HC °Preparation H °Tucks ° °Indigestion: °Tums °Maalox °Mylanta °Zantac  °Pepcid ° °Insomnia: °Benadryl (alcohol free) 25mg every 6 hours as needed °Tylenol PM °Unisom, no Gelcaps ° °Leg Cramps: °Tums °MagGel ° °Nausea/Vomiting:  °Bonine °Dramamine °Emetrol °Ginger extract °Sea bands °Meclizine  °Nausea medication to take during pregnancy:  °Unisom (doxylamine succinate 25 mg tablets) Take one tablet daily at bedtime. If symptoms are not adequately controlled, the dose can be increased to a maximum recommended dose of two tablets daily (1/2 tablet in the morning, 1/2 tablet mid-afternoon and one at bedtime). °Vitamin B6 100mg tablets. Take one tablet twice a day (up to 200 mg per day). ° °Skin Rashes: °Aveeno products °Benadryl cream or 25mg every 6 hours as needed °Calamine Lotion °1% cortisone cream ° °Yeast infection: °Gyne-lotrimin 7 °Monistat 7 ° ° °**If taking multiple medications, please check labels to avoid duplicating the same active ingredients °**take medication as directed on the label °** Do not exceed 4000 mg of tylenol in 24 hours °**Do not take medications that contain aspirin or ibuprofen ° ° ° ° °

## 2017-11-09 LAB — GC/CHLAMYDIA PROBE AMP (~~LOC~~) NOT AT ARMC
CHLAMYDIA, DNA PROBE: NEGATIVE
Neisseria Gonorrhea: NEGATIVE

## 2017-11-20 ENCOUNTER — Other Ambulatory Visit: Payer: Self-pay

## 2017-11-20 ENCOUNTER — Other Ambulatory Visit: Payer: Medicaid Other

## 2017-11-20 ENCOUNTER — Ambulatory Visit (INDEPENDENT_AMBULATORY_CARE_PROVIDER_SITE_OTHER): Payer: Medicaid Other | Admitting: Certified Nurse Midwife

## 2017-11-20 VITALS — BP 117/69 | HR 99 | Wt 211.3 lb

## 2017-11-20 DIAGNOSIS — Z3482 Encounter for supervision of other normal pregnancy, second trimester: Secondary | ICD-10-CM | POA: Diagnosis not present

## 2017-11-20 DIAGNOSIS — Z23 Encounter for immunization: Secondary | ICD-10-CM

## 2017-11-20 DIAGNOSIS — E559 Vitamin D deficiency, unspecified: Secondary | ICD-10-CM

## 2017-11-20 NOTE — Progress Notes (Signed)
ROB/GTT/TDAP.  TDAP given in right deltoid, tolerated well.

## 2017-11-20 NOTE — Progress Notes (Signed)
   PRENATAL VISIT NOTE  Subjective:  Erin Jacobs is a 22 y.o. [phone] at [redacted]w[redacted]d being seen today for ongoing prenatal care.  She is currently monitored for the following issues for this low-risk pregnancy and has Supervision of normal pregnancy in second trimester; Maternal varicella, non-immune; Familial hypospadias of penis; and Vitamin D deficiency on their problem list.  Patient reports no complaints.  Contractions: Not present. Vag. Bleeding: None.  Movement: Present. Denies leaking of fluid.   The following portions of the patient's history were reviewed and updated as appropriate: allergies, current medications, past family history, past medical history, past social history, past surgical history and problem list. Problem list updated.  Objective:   Vitals:   11/20/17 0900  BP: 117/69  Pulse: 99  Weight: 211 lb 4.8 oz (95.8 kg)    Fetal Status: Fetal Heart Rate (bpm): 122-152; doppler Fundal Height: 27 cm Movement: Present     General:  Alert, oriented and cooperative. Patient is in no acute distress.  Skin: Skin is warm and dry. No rash noted.   Cardiovascular: Normal heart rate noted  Respiratory: Normal respiratory effort, no problems with respiration noted  Abdomen: Soft, gravid, appropriate for gestational age.  Pain/Pressure: Absent     Pelvic: Cervical exam deferred        Extremities: Normal range of motion.  Edema: None  Mental Status: Normal mood and affect. Normal behavior. Normal judgment and thought content.   Assessment and Plan:  Pregnancy: [id number] at [redacted]w[redacted]d  1. Encounter for supervision of other normal pregnancy in second trimester      Doing well. Has f/u anatomy US scheduled.  - Culture, OB Urine - Glucose Tolerance, 2 Hours w/1 Hour - CBC - HIV antibody (with reflex) - RPR - Tdap vaccine greater than or equal to 7yo IM  2. Vitamin D deficiency      Taking weekly vitamin D.   Preterm labor symptoms and general obstetric precautions including but  not limited to vaginal bleeding, contractions, leaking of fluid and fetal movement were reviewed in detail with the patient. Please refer to After Visit Summary for other counseling recommendations.  Return in about 2 weeks (around 12/04/2017) for ROB.  Future Appointments  Date Time Provider [place]  11/23/2017  1:15 PM [facility] [place] Stachia Slutsky, CNM

## 2017-11-21 LAB — CBC
HEMATOCRIT: 30 % — AB (ref 34.0–46.6)
Hemoglobin: 9 g/dL — ABNORMAL LOW (ref 11.1–15.9)
MCH: 22.5 pg — ABNORMAL LOW (ref 26.6–33.0)
MCHC: 30 g/dL — AB (ref 31.5–35.7)
MCV: 75 fL — ABNORMAL LOW (ref 79–97)
Platelets: 368 10*3/uL (ref 150–379)
RBC: 4 x10E6/uL (ref 3.77–5.28)
RDW: 17.1 % — AB (ref 12.3–15.4)
WBC: 8 10*3/uL (ref 3.4–10.8)

## 2017-11-21 LAB — RPR: RPR: NONREACTIVE

## 2017-11-21 LAB — GLUCOSE TOLERANCE, 2 HOURS W/ 1HR
GLUCOSE, FASTING: 78 mg/dL (ref 65–91)
Glucose, 1 hour: 59 mg/dL — ABNORMAL LOW (ref 65–179)
Glucose, 2 hour: 95 mg/dL (ref 65–152)

## 2017-11-21 LAB — HIV ANTIBODY (ROUTINE TESTING W REFLEX): HIV Screen 4th Generation wRfx: NONREACTIVE

## 2017-11-23 ENCOUNTER — Other Ambulatory Visit: Payer: Self-pay | Admitting: Certified Nurse Midwife

## 2017-11-23 ENCOUNTER — Ambulatory Visit (HOSPITAL_COMMUNITY)
Admission: RE | Admit: 2017-11-23 | Discharge: 2017-11-23 | Disposition: A | Discharge status: Home / Self Care | Payer: Medicaid Other | Source: Ambulatory Visit | Attending: Certified Nurse Midwife | Admitting: Certified Nurse Midwife

## 2017-11-23 DIAGNOSIS — Z362 Encounter for other antenatal screening follow-up: Secondary | ICD-10-CM | POA: Insufficient documentation

## 2017-11-23 DIAGNOSIS — O0932 Supervision of pregnancy with insufficient antenatal care, second trimester: Secondary | ICD-10-CM | POA: Insufficient documentation

## 2017-11-23 DIAGNOSIS — Z3A27 27 weeks gestation of pregnancy: Secondary | ICD-10-CM | POA: Diagnosis not present

## 2017-11-23 DIAGNOSIS — Z3480 Encounter for supervision of other normal pregnancy, unspecified trimester: Secondary | ICD-10-CM

## 2017-11-26 LAB — URINE CULTURE, OB REFLEX

## 2017-11-26 LAB — CULTURE, OB URINE

## 2017-11-27 ENCOUNTER — Other Ambulatory Visit: Payer: Self-pay | Admitting: Certified Nurse Midwife

## 2017-11-27 DIAGNOSIS — Z3482 Encounter for supervision of other normal pregnancy, second trimester: Secondary | ICD-10-CM

## 2017-11-27 DIAGNOSIS — O99013 Anemia complicating pregnancy, third trimester: Secondary | ICD-10-CM

## 2017-11-27 DIAGNOSIS — O2343 Unspecified infection of urinary tract in pregnancy, third trimester: Secondary | ICD-10-CM

## 2017-11-27 DIAGNOSIS — O99019 Anemia complicating pregnancy, unspecified trimester: Secondary | ICD-10-CM | POA: Insufficient documentation

## 2017-11-27 MED ORDER — CITRANATAL BLOOM 90-1 MG PO TABS: 1.0000 | ORAL_TABLET | Freq: Every day | ORAL | 12 refills | Status: DC

## 2017-11-27 MED ORDER — AMOXICILLIN-POT CLAVULANATE 875-125 MG PO TABS: 1.0000 | ORAL_TABLET | Freq: Two times a day (BID) | ORAL | 0 refills | Status: DC

## 2017-12-04 ENCOUNTER — Encounter: Payer: Medicaid Other | Admitting: Certified Nurse Midwife

## 2017-12-05 ENCOUNTER — Telehealth: Payer: Self-pay | Admitting: *Deleted

## 2017-12-05 NOTE — Telephone Encounter (Signed)
Lft vmail for patient to callback and reschedule missed OB appointment on 12/04/2017.Marland KitchenMarland Kitchen

## 2017-12-28 ENCOUNTER — Encounter: Payer: Self-pay | Admitting: Certified Nurse Midwife

## 2017-12-28 ENCOUNTER — Ambulatory Visit (INDEPENDENT_AMBULATORY_CARE_PROVIDER_SITE_OTHER): Payer: Self-pay | Admitting: Certified Nurse Midwife

## 2017-12-28 VITALS — BP 109/69 | HR 105 | Wt 218.4 lb

## 2017-12-28 DIAGNOSIS — Z3482 Encounter for supervision of other normal pregnancy, second trimester: Secondary | ICD-10-CM

## 2017-12-28 DIAGNOSIS — O99013 Anemia complicating pregnancy, third trimester: Secondary | ICD-10-CM

## 2017-12-28 DIAGNOSIS — Z3483 Encounter for supervision of other normal pregnancy, third trimester: Secondary | ICD-10-CM

## 2017-12-28 MED ORDER — FERROUS SULFATE 325 (65 FE) MG PO TABS: 325.0000 mg | ORAL_TABLET | Freq: Two times a day (BID) | ORAL | 1 refills | Status: DC

## 2017-12-28 MED ORDER — DOCUSATE SODIUM 100 MG PO CAPS: 100.0000 mg | ORAL_CAPSULE | Freq: Two times a day (BID) | ORAL | 2 refills | Status: DC | PRN

## 2017-12-28 NOTE — Progress Notes (Incomplete)
   PRENATAL VISIT NOTE  Subjective:  Erin Jacobs is a 22 y.o. [phone] at [redacted]w[redacted]d being seen today for ongoing prenatal care.  She is currently monitored for the following issues for this {Blank single:19197::"high-risk","low-risk"} pregnancy and has Supervision of normal pregnancy in second trimester; Maternal varicella, non-immune; Familial hypospadias of penis; Vitamin D deficiency; UTI (urinary tract infection) during pregnancy, third trimester; and Anemia in pregnancy on their problem list.  Patient reports {sx:[id number]}.  Contractions: Irregular. Vag. Bleeding: None.  Movement: Present. Denies leaking of fluid.   The following portions of the patient's history were reviewed and updated as appropriate: allergies, current medications, past family history, past medical history, past social history, past surgical history and problem list. Problem list updated.  Objective:   Vitals:   12/28/17 1024  BP: 109/69  Pulse: (!) 105  Weight: 218 lb 6.4 oz (99.1 kg)    Fetal Status: Fetal Heart Rate (bpm): 155 Fundal Height: 32 cm Movement: Present     General:  Alert, oriented and cooperative. Patient is in no acute distress.  Skin: Skin is warm and dry. No rash noted.   Cardiovascular: Normal heart rate noted  Respiratory: Normal respiratory effort, no problems with respiration noted  Abdomen: Soft, gravid, appropriate for gestational age.  Pain/Pressure: Present     Pelvic: {Blank single:19197::"Cervical exam performed","Cervical exam deferred"}        Extremities: Normal range of motion.  Edema: None  Mental Status: Normal mood and affect. Normal behavior. Normal judgment and thought content.   Assessment and Plan:  Pregnancy: [id number] at [redacted]w[redacted]d  1. Encounter for supervision of other normal pregnancy in second trimester ***  2. Anemia during pregnancy in third trimester *** - ferrous sulfate (FERROUSUL) 325 (65 FE) MG tablet; Take 1 tablet (325 mg total) by mouth 2 (two) times daily.   Dispense: 60 tablet; Refill: 1 - docusate sodium (COLACE) 100 MG capsule; Take 1 capsule (100 mg total) by mouth 2 (two) times daily as needed.  Dispense: 30 capsule; Refill: 2  {Blank single:19197::"Term","Preterm"} labor symptoms and general obstetric precautions including but not limited to vaginal bleeding, contractions, leaking of fluid and fetal movement were reviewed in detail with the patient. Please refer to After Visit Summary for other counseling recommendations.  Return in about 2 weeks (around 01/11/2018) for ROB.  Future Appointments  Date Time Provider [place]  01/10/2018 10:00 AM Morene Crocker, CNM [place] None    Lajean Manes, [place]

## 2017-12-28 NOTE — Progress Notes (Signed)
   PRENATAL VISIT NOTE  Subjective:  Erin Jacobs is a 22 y.o. [phone] at [redacted]w[redacted]d being seen today for ongoing prenatal care.  She is currently monitored for the following issues for this low-risk pregnancy and has Supervision of normal pregnancy in second trimester; Maternal varicella, non-immune; Familial hypospadias of penis; Vitamin D deficiency; UTI (urinary tract infection) during pregnancy, third trimester; and Anemia in pregnancy on their problem list.  Patient reports no complaints.  Contractions: Irregular. Vag. Bleeding: None.  Movement: Present. Denies leaking of fluid.   The following portions of the patient's history were reviewed and updated as appropriate: allergies, current medications, past family history, past medical history, past social history, past surgical history and problem list. Problem list updated.  Objective:   Vitals:   12/28/17 1024  BP: 109/69  Pulse: (!) 105  Weight: 218 lb 6.4 oz (99.1 kg)    Fetal Status: Fetal Heart Rate (bpm): 155 Fundal Height: 32 cm Movement: Present     General:  Alert, oriented and cooperative. Patient is in no acute distress.  Skin: Skin is warm and dry. No rash noted.   Cardiovascular: Normal heart rate noted  Respiratory: Normal respiratory effort, no problems with respiration noted  Abdomen: Soft, gravid, appropriate for gestational age.  Pain/Pressure: Present     Pelvic: Cervical exam deferred        Extremities: Normal range of motion.  Edema: None  Mental Status: Normal mood and affect. Normal behavior. Normal judgment and thought content.   Assessment and Plan:  Pregnancy: [id number] at [redacted]w[redacted]d  1. Encounter for supervision of other normal pregnancy in second trimester -Patient doing well, no complaints  -Reports Braxton Hicks contractions that are sporadic and not painful   2. Anemia during pregnancy in third trimester -Educated and discussed importance of taking iron supplementation as prescribed in order to  prevent blood transfusion or IV iron.  -Patient reports having issue with insurance at this moment- can afford iron supplement pills.  -Rx for iron supplements and stool softeners sent to pharmacy, recheck CBC at 36 weeks - ferrous sulfate (FERROUSUL) 325 (65 FE) MG tablet; Take 1 tablet (325 mg total) by mouth 2 (two) times daily.  Dispense: 60 tablet; Refill: 1 - docusate sodium (COLACE) 100 MG capsule; Take 1 capsule (100 mg total) by mouth 2 (two) times daily as needed.  Dispense: 30 capsule; Refill: 2  Preterm labor symptoms and general obstetric precautions including but not limited to vaginal bleeding, contractions, leaking of fluid and fetal movement were reviewed in detail with the patient. Please refer to After Visit Summary for other counseling recommendations.  Return in about 2 weeks (around 01/11/2018) for ROB.  Future Appointments  Date Time Provider [place]  01/10/2018 10:00 AM Morene Crocker, CNM [place] None    Lajean Manes, [place]

## 2017-12-28 NOTE — Patient Instructions (Signed)

## 2017-12-28 NOTE — Progress Notes (Signed)
Patient reports good fetal movement with occasional back pain and irregular contractions, denies bleeding.

## 2018-01-10 ENCOUNTER — Encounter: Payer: Self-pay | Admitting: Certified Nurse Midwife

## 2018-01-14 ENCOUNTER — Inpatient Hospital Stay (HOSPITAL_COMMUNITY)
Admission: AD | Admit: 2018-01-14 | Discharge: 2018-01-14 | Disposition: A | Discharge status: Home / Self Care | Payer: Medicaid Other | Source: Ambulatory Visit | Attending: Obstetrics and Gynecology | Admitting: Obstetrics and Gynecology

## 2018-01-14 ENCOUNTER — Encounter (HOSPITAL_COMMUNITY): Payer: Self-pay | Admitting: *Deleted

## 2018-01-14 DIAGNOSIS — G5600 Carpal tunnel syndrome, unspecified upper limb: Secondary | ICD-10-CM | POA: Diagnosis not present

## 2018-01-14 DIAGNOSIS — O26893 Other specified pregnancy related conditions, third trimester: Secondary | ICD-10-CM

## 2018-01-14 DIAGNOSIS — Z79899 Other long term (current) drug therapy: Secondary | ICD-10-CM | POA: Diagnosis not present

## 2018-01-14 DIAGNOSIS — O26899 Other specified pregnancy related conditions, unspecified trimester: Secondary | ICD-10-CM

## 2018-01-14 DIAGNOSIS — O99353 Diseases of the nervous system complicating pregnancy, third trimester: Secondary | ICD-10-CM | POA: Diagnosis not present

## 2018-01-14 DIAGNOSIS — Z3482 Encounter for supervision of other normal pregnancy, second trimester: Secondary | ICD-10-CM

## 2018-01-14 DIAGNOSIS — Z3A35 35 weeks gestation of pregnancy: Secondary | ICD-10-CM | POA: Diagnosis not present

## 2018-01-14 LAB — URINALYSIS, ROUTINE W REFLEX MICROSCOPIC
Bilirubin Urine: NEGATIVE
Glucose, UA: NEGATIVE mg/dL
HGB URINE DIPSTICK: NEGATIVE
Ketones, ur: NEGATIVE mg/dL
NITRITE: NEGATIVE
Protein, ur: NEGATIVE mg/dL
SPECIFIC GRAVITY, URINE: 1.015 (ref 1.005–1.030)
pH: 6 (ref 5.0–8.0)

## 2018-01-14 MED ORDER — WRIST BRACE/RIGHT MEDIUM MISC: 1.0000 | Freq: Every day | 0 refills | Status: DC

## 2018-01-14 MED ORDER — WRIST BRACE/LEFT MEDIUM MISC: 1.0000 | Freq: Every day | 0 refills | Status: DC

## 2018-01-14 NOTE — MAU Note (Signed)
Pt c/o her her hand getting swollen over the past few weeks. States she is loosing strength and its hard for her to fasten her baby's diaper.

## 2018-01-14 NOTE — MAU Provider Note (Signed)
Chief Complaint:  Edema   None     HPI: Erin Jacobs is a 22 y.o. [phone] at [redacted]w[redacted]d who presents to maternity admissions reporting tingling pain, numbness, and swelling of both hands x 1 week, worsening today. Today, she had difficulty changing her 43 month old's diaper because of her symptoms.  There are no other symptoms. She denies h/a, epigastric pain, or visual disturbances.  She was worried that swelling in her hand might be preeclampsia.  Her symptoms are worse at night and makes it difficult to find a comfortable position to sleep. She has not tried any treatments. She reports good fetal movement, denies LOF, vaginal bleeding, vaginal itching/burning, urinary symptoms, h/a, dizziness, n/v, or fever/chills.    HPI  Past Medical History: Past Medical History:  Diagnosis Date  . Medical history non-contributory     Past obstetric history: OB History  Gravida Para Term Preterm AB Living  4 2 2  0 1 2  SAB TAB Ectopic Multiple Live Births  1 0 0 0 2    # Outcome Date GA Lbr Len/2nd Weight Sex Delivery Anes PTL Lv  4 Current           3 Term 02/23/17 [redacted]w[redacted]d 14:36 / 00:02 6 lb 15.3 oz (3.155 kg) M Vag-Spont Local  LIV  2 Term 03/10/14 [redacted]w[redacted]d 10:18 / 00:34 6 lb 6.8 oz (2.914 kg) M Vag-Spont EPI  LIV     Birth Comments: WNL, small abraision on roof of mouth  1 SAB 2014 [redacted]w[redacted]d       DEC    Past Surgical History: Past Surgical History:  Procedure Laterality Date  . leg suregry- childhood      Family History: Family History  Problem Relation Age of Onset  . Diabetes Maternal Aunt   . Diabetes Maternal Uncle     Social History: Social History   Tobacco Use  . Smoking status: Never Smoker  . Smokeless tobacco: Never Used  Substance Use Topics  . Alcohol use: No  . Drug use: No    Allergies: No Known Allergies  Meds:  Medications Prior to Admission  Medication Sig Dispense Refill Last Dose  . acetaminophen (TYLENOL) 500 MG tablet Take 1,000 mg by mouth every 6 (six)  hours as needed for mild pain or headache.   01/13/2018 at Unknown time  . albuterol (PROVENTIL HFA;VENTOLIN HFA) 108 (90 Base) MCG/ACT inhaler Inhale 1-2 puffs into the lungs every 6 (six) hours as needed for wheezing or shortness of breath.   couple months  . ferrous sulfate (FERROUSUL) 325 (65 FE) MG tablet Take 1 tablet (325 mg total) by mouth 2 (two) times daily. 60 tablet 1 01/14/2018 at Unknown time  . Prenatal-DSS-FeCb-FeGl-FA (CITRANATAL BLOOM) 90-1 MG TABS Take 1 tablet by mouth daily. 30 tablet 12 01/14/2018 at Unknown time  . amoxicillin-clavulanate (AUGMENTIN) 875-125 MG tablet Take 1 tablet by mouth 2 (two) times daily. (Patient not taking: Reported on 12/28/2017) 14 tablet 0 Not Taking  . docusate sodium (COLACE) 100 MG capsule Take 1 capsule (100 mg total) by mouth 2 (two) times daily as needed. 30 capsule 2   . Prenat-Fe Poly-Methfol-FA-DHA (VITAFOL ULTRA) 29-0.6-0.4-200 MG CAPS Take 1 capsule by mouth daily before breakfast. 90 capsule 4 Taking  . terconazole (TERAZOL 7) 0.4 % vaginal cream Place 1 applicator vaginally at bedtime. (Patient not taking: Reported on 12/28/2017) 45 g 0 Not Taking  . Vitamin D, Ergocalciferol, (DRISDOL) 50000 units CAPS capsule Take 1 capsule (50,000 Units total)  by mouth every 7 (seven) days. (Patient not taking: Reported on 12/28/2017) 30 capsule 2 Not Taking    ROS:  Review of Systems  Constitutional: Negative for chills, fatigue and fever.  Eyes: Negative for visual disturbance.  Respiratory: Negative for shortness of breath.   Cardiovascular: Negative for chest pain.  Gastrointestinal: Negative for abdominal pain, nausea and vomiting.  Genitourinary: Negative for difficulty urinating, dysuria, flank pain, pelvic pain, vaginal bleeding, vaginal discharge and vaginal pain.  Musculoskeletal:       Tingling of fingers, numbness of tips of fingers of both hands   Neurological: Negative for dizziness and headaches.  Psychiatric/Behavioral: Negative.       I have reviewed patient's Past Medical Hx, Surgical Hx, Family Hx, Social Hx, medications and allergies.   Physical Exam   Patient Vitals for the past 24 hrs:  BP Temp Temp src Pulse Resp SpO2  01/14/18 1258 116/64 98 F (36.7 C) Oral (!) 112 20 100 %  01/14/18 1110 112/64 98.1 F (36.7 C) - 79 18 -   Constitutional: Well-developed, well-nourished female in no acute distress.  Cardiovascular: normal rate Respiratory: normal effort GI: Abd soft, non-tender, gravid appropriate for gestational age.  MS: Extremities nontender, mild, trace bilateral edema of hands, no other edema noted, normal ROM Neurologic: Alert and oriented x 4.  GU: Neg CVAT.    FHT: 146 by doppler  Per EFM Exclusion Criteria Guidelines, FHT only performed since pt presented with nonobstetric complaints and without systemic symptoms.   Labs: Results for orders placed or performed during the hospital encounter of 01/14/18 (from the past 24 hour(s))  Urinalysis, Routine w reflex microscopic     Status: Abnormal   Collection Time: 01/14/18 11:21 AM  Result Value Ref Range   Color, Urine YELLOW YELLOW   APPearance HAZY (A) CLEAR   Specific Gravity, Urine 1.015 1.005 - 1.030   pH 6.0 5.0 - 8.0   Glucose, UA NEGATIVE NEGATIVE mg/dL   Hgb urine dipstick NEGATIVE NEGATIVE   Bilirubin Urine NEGATIVE NEGATIVE   Ketones, ur NEGATIVE NEGATIVE mg/dL   Protein, ur NEGATIVE NEGATIVE mg/dL   Nitrite NEGATIVE NEGATIVE   Leukocytes, UA MODERATE (A) NEGATIVE   RBC / HPF 0-5 0 - 5 RBC/hpf   WBC, UA 6-10 0 - 5 WBC/hpf   Bacteria, UA RARE (A) NONE SEEN   Squamous Epithelial / LPF 6-10 0 - 5   Mucus PRESENT    B/Positive/-- (04/09 1526)  Imaging:  No results found.  MAU Course/MDM: BP wnl today x 2 FHT wnl (see MFM exclusionary guidelines for nonobstetric complaints) Pain/numbness and tingling are most c/w carpal tunnel Teaching done, recommend ice, wrist braces (Rx written) Likely will improve after  delivery Pt reassured, to f/u with OB visit next week as scheduled Pt discharge with strict return precautions.   Assessment: 1. Carpal tunnel syndrome during pregnancy   2. Encounter for supervision of other normal pregnancy in second trimester     Plan: Discharge home Labor precautions and fetal kick counts Follow-up Information    [facility] Follow up.   Why:  As scheduled weekly after 36 weeks. Return to MAU as needed for emergencies. Contact information: [place] [phone]         Allergies as of 01/14/2018   No Known Allergies     Medication List    STOP taking these medications   amoxicillin-clavulanate 875-125 MG tablet Commonly known as:  AUGMENTIN  terconazole 0.4 % vaginal cream Commonly known as:  TERAZOL 7     TAKE these medications   acetaminophen 500 MG tablet Commonly known as:  TYLENOL Take 1,000 mg by mouth every 6 (six) hours as needed for mild pain or headache.   albuterol 108 (90 Base) MCG/ACT inhaler Commonly known as:  PROVENTIL HFA;VENTOLIN HFA Inhale 1-2 puffs into the lungs every 6 (six) hours as needed for wheezing or shortness of breath.   CITRANATAL BLOOM 90-1 MG Tabs Take 1 tablet by mouth daily.   docusate sodium 100 MG capsule Commonly known as:  COLACE Take 1 capsule (100 mg total) by mouth 2 (two) times daily as needed.   ferrous sulfate 325 (65 FE) MG tablet Commonly known as:  FERROUSUL Take 1 tablet (325 mg total) by mouth 2 (two) times daily.   VITAFOL ULTRA 29-0.6-0.4-200 MG Caps Take 1 capsule by mouth daily before breakfast.   Vitamin D (Ergocalciferol) 50000 units Caps capsule Commonly known as:  DRISDOL Take 1 capsule (50,000 Units total) by mouth every 7 (seven) days.   Wrist Brace/Right Medium Misc 1 Device by Does not apply route daily.   Wrist Brace/Left Medium Misc 1 Device by Does not apply route daily.       Fatima Blank Certified Nurse-Midwife 01/14/2018 1:03 PM

## 2018-01-28 ENCOUNTER — Other Ambulatory Visit: Payer: Self-pay

## 2018-01-28 ENCOUNTER — Inpatient Hospital Stay (HOSPITAL_COMMUNITY)
Admission: AD | Admit: 2018-01-28 | Discharge: 2018-01-28 | Disposition: A | Discharge status: Home / Self Care | Payer: Medicaid Other | Source: Ambulatory Visit | Attending: Obstetrics & Gynecology | Admitting: Obstetrics & Gynecology

## 2018-01-28 DIAGNOSIS — Z3A37 37 weeks gestation of pregnancy: Secondary | ICD-10-CM | POA: Diagnosis not present

## 2018-01-28 DIAGNOSIS — O471 False labor at or after 37 completed weeks of gestation: Secondary | ICD-10-CM | POA: Diagnosis not present

## 2018-01-28 NOTE — Progress Notes (Addendum)
S: Ms. Erin Jacobs is a 22 y.o. [phone] at [redacted]w[redacted]d  who presents to MAU today complaining contractions throughout the day. She denies vaginal bleeding. She denies LOF. She reports normal fetal movement.    O: BP (!) 89/57 (BP Location: Right Arm)   Pulse (!) 105   Temp 98.3 F (36.8 C) (Oral)   Resp 18   Ht 5\' 7"  (1.702 m)   Wt 220 lb 12 oz (100.1 kg)   LMP 04/05/2017   SpO2 100%   Breastfeeding? No   BMI 34.57 kg/m  GENERAL: Well-developed, well-nourished female in no acute distress.  HEAD: Normocephalic, atraumatic.  CHEST: Normal effort of breathing, regular heart rate ABDOMEN: Soft, nontender, gravid  Cervical exam:  Dilation: 2 Effacement (%): 20 Cervical Position: Middle Station: -3 Presentation: Vertex Exam by:: klm   Fetal Monitoring:Reactive NST Baseline: 140 Variability: moderate Accelerations: positive 15 x 15 Decelerations: none Contractions: irregular, q 5-9 min   A: [facility] at [redacted]w[redacted]d  False labor   P: Discharge home in stable condition   Darlina Rumpf, [place] 01/28/2018 4:20 PM

## 2018-01-28 NOTE — Discharge Instructions (Signed)
Braxton Hicks Contractions °Contractions of the uterus can occur throughout pregnancy, but they are not always a sign that you are in labor. You may have practice contractions called Braxton Hicks contractions. These false labor contractions are sometimes confused with true labor. °What are Braxton Hicks contractions? °Braxton Hicks contractions are tightening movements that occur in the muscles of the uterus before labor. Unlike true labor contractions, these contractions do not result in opening (dilation) and thinning of the cervix. Toward the end of pregnancy (32-34 weeks), Braxton Hicks contractions can happen more often and may become stronger. These contractions are sometimes difficult to tell apart from true labor because they can be very uncomfortable. You should not feel embarrassed if you go to the hospital with false labor. °Sometimes, the only way to tell if you are in true labor is for your health care provider to look for changes in the cervix. The health care provider will do a physical exam and may monitor your contractions. If you are not in true labor, the exam should show that your cervix is not dilating and your water has not broken. °If there are other health problems associated with your pregnancy, it is completely safe for you to be sent home with false labor. You may continue to have Braxton Hicks contractions until you go into true labor. °How to tell the difference between true labor and false labor °True labor °· Contractions last 30-70 seconds. °· Contractions become very regular. °· Discomfort is usually felt in the top of the uterus, and it spreads to the lower abdomen and low back. °· Contractions do not go away with walking. °· Contractions usually become more intense and increase in frequency. °· The cervix dilates and gets thinner. °False labor °· Contractions are usually shorter and not as strong as true labor contractions. °· Contractions are usually irregular. °· Contractions  are often felt in the front of the lower abdomen and in the groin. °· Contractions may go away when you walk around or change positions while lying down. °· Contractions get weaker and are shorter-lasting as time goes on. °· The cervix usually does not dilate or become thin. °Follow these instructions at home: °· Take over-the-counter and prescription medicines only as told by your health care provider. °· Keep up with your usual exercises and follow other instructions from your health care provider. °· Eat and drink lightly if you think you are going into labor. °· If Braxton Hicks contractions are making you uncomfortable: °? Change your position from lying down or resting to walking, or change from walking to resting. °? Sit and rest in a tub of warm water. °? Drink enough fluid to keep your urine pale yellow. Dehydration may cause these contractions. °? Do slow and deep breathing several times an hour. °· Keep all follow-up prenatal visits as told by your health care provider. This is important. °Contact a health care provider if: °· You have a fever. °· You have continuous pain in your abdomen. °Get help right away if: °· Your contractions become stronger, more regular, and closer together. °· You have fluid leaking or gushing from your vagina. °· You pass blood-tinged mucus (bloody show). °· You have bleeding from your vagina. °· You have low back pain that you never had before. °· You feel your baby’s head pushing down and causing pelvic pressure. °· Your baby is not moving inside you as much as it used to. °Summary °· Contractions that occur before labor are called Braxton   Hicks contractions, false labor, or practice contractions. °· Braxton Hicks contractions are usually shorter, weaker, farther apart, and less regular than true labor contractions. True labor contractions usually become progressively stronger and regular and they become more frequent. °· Manage discomfort from Braxton Hicks contractions by  changing position, resting in a warm bath, drinking plenty of water, or practicing deep breathing. °This information is not intended to replace advice given to you by your health care provider. Make sure you discuss any questions you have with your health care provider. °Document Released: 11/16/2016 Document Revised: 11/16/2016 Document Reviewed: 11/16/2016 °Elsevier Interactive Patient Education © 2018 [employer] ° °Fetal Movement Counts °Patient Name: ________________________________________________ Patient Due Date: ____________________ °What is a fetal movement count? °A fetal movement count is the number of times that you feel your baby move during a certain amount of time. This may also be called a fetal kick count. A fetal movement count is recommended for every pregnant woman. You may be asked to start counting fetal movements as early as week 28 of your pregnancy. °Pay attention to when your baby is most active. You may notice your baby's sleep and wake cycles. You may also notice things that make your baby move more. You should do a fetal movement count: °· When your baby is normally most active. °· At the same time each day. ° °A good time to count movements is while you are resting, after having something to eat and drink. °How do I count fetal movements? °1. Find a quiet, comfortable area. Sit, or lie down on your side. °2. Write down the date, the start time and stop time, and the number of movements that you felt between those two times. Take this information with you to your health care visits. °3. For 2 hours, count kicks, flutters, swishes, rolls, and jabs. You should feel at least 10 movements during 2 hours. °4. You may stop counting after you have felt 10 movements. °5. If you do not feel 10 movements in 2 hours, have something to eat and drink. Then, keep resting and counting for 1 hour. If you feel at least 4 movements during that hour, you may stop counting. °Contact a health care  provider if: °· You feel fewer than 4 movements in 2 hours. °· Your baby is not moving like he or she usually does. °Date: ____________ Start time: ____________ Stop time: ____________ Movements: ____________ °Date: ____________ Start time: ____________ Stop time: ____________ Movements: ____________ °Date: ____________ Start time: ____________ Stop time: ____________ Movements: ____________ °Date: ____________ Start time: ____________ Stop time: ____________ Movements: ____________ °Date: ____________ Start time: ____________ Stop time: ____________ Movements: ____________ °Date: ____________ Start time: ____________ Stop time: ____________ Movements: ____________ °Date: ____________ Start time: ____________ Stop time: ____________ Movements: ____________ °Date: ____________ Start time: ____________ Stop time: ____________ Movements: ____________ °Date: ____________ Start time: ____________ Stop time: ____________ Movements: ____________ °This information is not intended to replace advice given to you by your health care provider. Make sure you discuss any questions you have with your health care provider. °Document Released: 08/02/2006 Document Revised: 03/01/2016 Document Reviewed: 08/12/2015 °Elsevier Interactive Patient Education © 2018 [employer] ° °

## 2018-01-28 NOTE — MAU Note (Signed)
Urine in lab 

## 2018-01-28 NOTE — MAU Note (Signed)
Presents to MAU c/o contractions q 3-4 minutes apart c. 1100. States spotting since yesterday.

## 2018-01-29 ENCOUNTER — Telehealth: Payer: Self-pay | Admitting: *Deleted

## 2018-01-29 NOTE — Telephone Encounter (Signed)
[name], patient has not been seen since 06/14.Erin KitchenMarland Jacobs

## 2018-02-19 ENCOUNTER — Inpatient Hospital Stay (HOSPITAL_COMMUNITY)
Admission: AD | Admit: 2018-02-19 | Discharge: 2018-02-19 | Disposition: A | Discharge status: Home / Self Care | Payer: Medicaid Other | Source: Ambulatory Visit | Attending: Obstetrics & Gynecology | Admitting: Obstetrics & Gynecology

## 2018-02-19 ENCOUNTER — Encounter (HOSPITAL_COMMUNITY): Payer: Self-pay

## 2018-02-19 DIAGNOSIS — O0933 Supervision of pregnancy with insufficient antenatal care, third trimester: Secondary | ICD-10-CM

## 2018-02-19 DIAGNOSIS — O471 False labor at or after 37 completed weeks of gestation: Secondary | ICD-10-CM | POA: Insufficient documentation

## 2018-02-19 DIAGNOSIS — Z3A40 40 weeks gestation of pregnancy: Secondary | ICD-10-CM | POA: Diagnosis not present

## 2018-02-19 DIAGNOSIS — O479 False labor, unspecified: Secondary | ICD-10-CM

## 2018-02-19 DIAGNOSIS — Z3483 Encounter for supervision of other normal pregnancy, third trimester: Secondary | ICD-10-CM

## 2018-02-19 DIAGNOSIS — Z3689 Encounter for other specified antenatal screening: Secondary | ICD-10-CM

## 2018-02-19 HISTORY — DX: Unspecified abnormal cytological findings in specimens from vagina: R87.629

## 2018-02-19 LAB — POCT FERN TEST
POCT Fern Test: NEGATIVE
POCT Fern Test: NEGATIVE

## 2018-02-19 NOTE — Progress Notes (Signed)
Pt states she has been having ongoing UCs x 1.5 weeks, but they became more "sharp, tense" at approx 0900. Rates them as a 9 on 0-10 pain scale.  Endorses +FM, denies vag bleeding or LOF.

## 2018-02-19 NOTE — MAU Provider Note (Signed)
History     CSN: [id number]  Arrival date and time: 02/19/18 1052   First Provider Initiated Contact with Patient 02/19/18 1136      Chief Complaint  Patient presents with  . Labor Eval   HPI CIANA SIMMON is a 22 y.o. [phone] at [redacted]w[redacted]d who presents to MAU with lower abdominal contractions that began this morning. Rates pain as 9/10, bilateral low abdomen, does not radiate, no aggravating or alleviating factors. Denies vaginal bleeding, leaking of fluid, decreased fetal movement, fever, falls, or recent illness.    Patient referencing previous delivery. States she was admitted to labor and delivery at 28.1 GA with her previous child and "it's some bullshit" she is not being identified as in labor today.  History is significant for SVD at 40.1 and 40.6 weeks.  Patient Active Problem List   Diagnosis Date Noted  . UTI (urinary tract infection) during pregnancy, third trimester 11/27/2017  . Anemia in pregnancy 11/27/2017  . Vitamin D deficiency 10/24/2017  . Maternal varicella, non-immune 10/23/2017  . Familial hypospadias of penis 10/23/2017  . Supervision of normal pregnancy in second trimester 10/12/2017     Nursing Staff Provider  Office Location  [facility] Dating  10/18/17 [place]  Language  English Anatomy US  10/18/17: normal limited anatomy; f/u US normal female fetus  Flu Vaccine  Decline Genetic Screen  NIPS:Panorama: Neg/fetal sex not reported   AFP: too late     TDaP vaccine   11/20/17 Hgb A1C or  GTT  Third trimester   Rhogam  B+   LAB RESULTS   Contraception  Nexplanon Blood Type B/Positive/-- (04/09 1526)   Circumcision  Yes Antibody Negative (04/09 1526)  Pediatrician  undecided Rubella 10.70 (04/09 1526)  Support Person   FOB-Malcolm RPR Non Reactive (05/07 1106)   Prenatal Classes  no HBsAg Negative (04/09 1526)   BTL Consent  HIV Non Reactive (05/07 1106)  VBAC Consent  GBS Positive (07/09 1642)(For PCN allergy, check sensitivities)     Pap 08/16/16: normal    Hgb  Electro   Normal    CF Neg    SMA     Waterbirth  [ ]  Class [ ]  Consent [ ]  CNM visit       OB History    Gravida  4   Para  2   Term  2   Preterm  0   AB  1   Living  2     SAB  1   TAB  0   Ectopic  0   Multiple  0   Live Births  2           Past Medical History:  Diagnosis Date  . Medical history non-contributory   . Vaginal Pap smear, abnormal     Past Surgical History:  Procedure Laterality Date  . leg suregry- childhood      Family History  Problem Relation Age of Onset  . Diabetes Maternal Aunt   . Diabetes Maternal Uncle     Social History   Tobacco Use  . Smoking status: Never Smoker  . Smokeless tobacco: Never Used  Substance Use Topics  . Alcohol use: No  . Drug use: Not Currently    Allergies: No Known Allergies  Medications Prior to Admission  Medication Sig Dispense Refill Last Dose  . acetaminophen (TYLENOL) 500 MG tablet Take 1,000 mg by mouth every 6 (six) hours as needed for mild pain or headache.   unk at  prn  . docusate sodium (COLACE) 100 MG capsule Take 1 capsule (100 mg total) by mouth 2 (two) times daily as needed. (Patient not taking: Reported on 01/28/2018) 30 capsule 2 Not Taking at Unknown time  . Elastic Bandages & Supports (WRIST BRACE/LEFT MEDIUM) MISC 1 Device by Does not apply route daily. 1 each 0   . Elastic Bandages & Supports (WRIST BRACE/RIGHT MEDIUM) MISC 1 Device by Does not apply route daily. 1 each 0   . ferrous sulfate (FERROUSUL) 325 (65 FE) MG tablet Take 1 tablet (325 mg total) by mouth 2 (two) times daily. 60 tablet 1 01/27/2018  . Prenat-Fe Poly-Methfol-FA-DHA (VITAFOL ULTRA) 29-0.6-0.4-200 MG CAPS Take 1 capsule by mouth daily before breakfast. (Patient taking differently: Take 2 capsules by mouth daily before breakfast. ) 90 capsule 4 01/27/2018 at Unknown time  . Prenatal-DSS-FeCb-FeGl-FA (CITRANATAL BLOOM) 90-1 MG TABS Take 1 tablet by mouth daily. (Patient not taking: Reported on 01/28/2018) 30  tablet 12 Not Taking at Unknown time  . Vitamin D, Ergocalciferol, (DRISDOL) 50000 units CAPS capsule Take 1 capsule (50,000 Units total) by mouth every 7 (seven) days. (Patient not taking: Reported on 12/28/2017) 30 capsule 2 Not Taking at Unknown time    Review of Systems  Constitutional: Negative for chills, fatigue and fever.  Respiratory: Negative for shortness of breath.   Gastrointestinal: Positive for abdominal pain. Negative for nausea and vomiting.  Genitourinary: Positive for vaginal discharge. Negative for vaginal bleeding and vaginal pain.  Musculoskeletal: Negative for back pain.  Neurological: Negative for headaches.  All other systems reviewed and are negative.  Physical Exam   Blood pressure 119/60, pulse 97, temperature 98.7 F (37.1 C), temperature source Oral, resp. rate 20, height 5\' 7"  (1.702 m), weight 225 lb (102.1 kg), last menstrual period 04/05/2017, SpO2 100 %, not currently breastfeeding.  Physical Exam  Nursing note and vitals reviewed. Constitutional: She appears well-developed and well-nourished.  Cardiovascular: Normal rate, regular rhythm, normal heart sounds and intact distal pulses.  Genitourinary: Vaginal discharge found.  Genitourinary Comments: Thick white vaginal discharge visible at introitus    MAU Course  Procedures  MDM --Negative pooling --Negative Fern --Reactive NST: baseline 130, moderate variability, positive accelerations, no decelerations --Toco: irregular contractions q 3-13 min, patient talking through ctx --SVE: 2.5-3/thick/posterior --Patient noticeably upset when RN and I entered room and I communicated she was not in active labor and would not be admitted to [facility].  --Seated at bedside, I discussed that I was examining samples of vaginal mucus to check for ROM, and she would be admitted if ruptured --Pt verbalized to me that she was ready to be discharged.  --I explained that if she left before ferning could be  confirmed she would be leaving AMA.  Inquired if patient was willing to wait 15 minutes for ferning as it would be a justifiable reason for admission. Pt verbalized she would wait for answer then leave. --Discussed that she may be in early labor and if she returned to MAU at 5-6cm we would admit her --While waiting for fern slide to dry, patient summoned her mother to room. She and patient were visibly upset, verbalizing strong feelings about standard of care.  --Engineer, petroleum and security requested by me. They both appeared immediately. With [facility] Lorinda in room I reiterated patient's triage, decision points in care, lack of medical indication for IOL at 40.1 weeks and risks associated with elective induction. --I provided my name, licensure, contact information, and Attending's name --Siri Cole  provided business card and additional resources for resolving dissatisfaction with standard of care provided in MAU  Patient Vitals for the past 24 hrs:  BP Temp Temp src Pulse Resp SpO2 Height Weight  02/19/18 1222 119/64 98.6 F (37 C) Oral 98 20 100 % - -  02/19/18 1058 119/60 98.7 F (37.1 C) Oral 97 20 100 % 5\' 7"  (1.702 m) 225 lb (102.1 kg)    Orders Placed This Encounter  Procedures  . Culture, beta strep (group b only)  . POCT fern test  . POCT fern test  . Discharge patient   Results for orders placed or performed during the hospital encounter of 02/19/18 (from the past 24 hour(s))  POCT fern test     Status: None   Collection Time: 02/19/18 11:20 AM  Result Value Ref Range   POCT Fern Test Negative = intact amniotic membranes   POCT fern test     Status: None   Collection Time: 02/19/18 11:45 AM  Result Value Ref Range   POCT Fern Test Negative = intact amniotic membranes     Assessment and Plan  --22 y.o. [id number] at [redacted]w[redacted]d  --False labor --Intact membranes --Reactive NST --Patient to schedule ROB --Discharge home in stable condition  Events of MAU course of care  discussed with MAU Attending Dr. Roselie Awkward, who agrees with my plan of care  Mallie Snooks, CNM 02/19/18  3:46 PM

## 2018-02-19 NOTE — MAU Note (Signed)
Pt presents with worsening contractions. Last SVE 2 cm

## 2018-02-20 LAB — GC/CHLAMYDIA PROBE AMP (~~LOC~~) NOT AT ARMC
CHLAMYDIA, DNA PROBE: NEGATIVE
Neisseria Gonorrhea: NEGATIVE

## 2018-02-21 ENCOUNTER — Encounter: Payer: Self-pay | Admitting: Obstetrics and Gynecology

## 2018-02-21 ENCOUNTER — Inpatient Hospital Stay (HOSPITAL_COMMUNITY)
Admission: AD | Admit: 2018-02-21 | Discharge: 2018-02-23 | DRG: 806 | Disposition: A | Discharge status: Home / Self Care | Payer: Medicaid Other | Attending: Obstetrics and Gynecology | Admitting: Obstetrics and Gynecology

## 2018-02-21 ENCOUNTER — Encounter (HOSPITAL_COMMUNITY): Payer: Self-pay | Admitting: *Deleted

## 2018-02-21 DIAGNOSIS — O9902 Anemia complicating childbirth: Secondary | ICD-10-CM | POA: Diagnosis present

## 2018-02-21 DIAGNOSIS — Z2839 Other underimmunization status: Secondary | ICD-10-CM

## 2018-02-21 DIAGNOSIS — E559 Vitamin D deficiency, unspecified: Secondary | ICD-10-CM | POA: Diagnosis present

## 2018-02-21 DIAGNOSIS — Z3A40 40 weeks gestation of pregnancy: Secondary | ICD-10-CM

## 2018-02-21 DIAGNOSIS — D649 Anemia, unspecified: Secondary | ICD-10-CM | POA: Diagnosis present

## 2018-02-21 DIAGNOSIS — Z3492 Encounter for supervision of normal pregnancy, unspecified, second trimester: Secondary | ICD-10-CM

## 2018-02-21 DIAGNOSIS — O09899 Supervision of other high risk pregnancies, unspecified trimester: Secondary | ICD-10-CM

## 2018-02-21 DIAGNOSIS — O99019 Anemia complicating pregnancy, unspecified trimester: Secondary | ICD-10-CM | POA: Diagnosis present

## 2018-02-21 DIAGNOSIS — O2343 Unspecified infection of urinary tract in pregnancy, third trimester: Secondary | ICD-10-CM | POA: Diagnosis present

## 2018-02-21 DIAGNOSIS — Z283 Underimmunization status: Secondary | ICD-10-CM

## 2018-02-21 DIAGNOSIS — Z3483 Encounter for supervision of other normal pregnancy, third trimester: Secondary | ICD-10-CM | POA: Diagnosis present

## 2018-02-21 LAB — CBC
HEMATOCRIT: 30.1 % — AB (ref 36.0–46.0)
HEMOGLOBIN: 9.3 g/dL — AB (ref 12.0–15.0)
MCH: 23.1 pg — ABNORMAL LOW (ref 26.0–34.0)
MCHC: 30.9 g/dL (ref 30.0–36.0)
MCV: 74.7 fL — AB (ref 78.0–100.0)
Platelets: 326 10*3/uL (ref 150–400)
RBC: 4.03 MIL/uL (ref 3.87–5.11)
RDW: 19.7 % — AB (ref 11.5–15.5)
WBC: 8.6 10*3/uL (ref 4.0–10.5)

## 2018-02-21 LAB — CULTURE, BETA STREP (GROUP B ONLY)

## 2018-02-21 LAB — TYPE AND SCREEN
ABO/RH(D): B POS
ANTIBODY SCREEN: NEGATIVE

## 2018-02-21 LAB — OB RESULTS CONSOLE GBS: GBS: NEGATIVE

## 2018-02-21 MED ORDER — TETANUS-DIPHTH-ACELL PERTUSSIS 5-2.5-18.5 LF-MCG/0.5 IM SUSP: 0.5000 mL | Freq: Once | INTRAMUSCULAR | Status: DC

## 2018-02-21 MED ORDER — WITCH HAZEL-GLYCERIN EX PADS: 1.0000 "application " | MEDICATED_PAD | CUTANEOUS | Status: DC | PRN

## 2018-02-21 MED ORDER — METHYLERGONOVINE MALEATE 0.2 MG PO TABS: 0.2000 mg | ORAL_TABLET | ORAL | Status: DC | PRN

## 2018-02-21 MED ORDER — METHYLERGONOVINE MALEATE 0.2 MG/ML IJ SOLN
0.2000 mg | INTRAMUSCULAR | Status: DC | PRN
  Administered 2018-02-21: 0.2 mg via INTRAMUSCULAR

## 2018-02-21 MED ORDER — LIDOCAINE HCL (PF) 1 % IJ SOLN
30.0000 mL | INTRAMUSCULAR | Status: DC | PRN
  Filled 2018-02-21: qty 30

## 2018-02-21 MED ORDER — COCONUT OIL OIL: 1.0000 "application " | TOPICAL_OIL | Status: DC | PRN

## 2018-02-21 MED ORDER — OXYTOCIN 40 UNITS IN LACTATED RINGERS INFUSION - SIMPLE MED
2.5000 [IU]/h | INTRAVENOUS | Status: DC
  Filled 2018-02-21: qty 1000

## 2018-02-21 MED ORDER — PRENATAL MULTIVITAMIN CH
1.0000 | ORAL_TABLET | Freq: Every day | ORAL | Status: DC
  Administered 2018-02-22 – 2018-02-23 (×2): 1 via ORAL
  Filled 2018-02-21 (×2): qty 1

## 2018-02-21 MED ORDER — FERROUS SULFATE 325 (65 FE) MG PO TABS
325.0000 mg | ORAL_TABLET | Freq: Two times a day (BID) | ORAL | Status: DC
  Administered 2018-02-22 – 2018-02-23 (×3): 325 mg via ORAL
  Filled 2018-02-21 (×4): qty 1

## 2018-02-21 MED ORDER — BENZOCAINE-MENTHOL 20-0.5 % EX AERO: 1.0000 "application " | INHALATION_SPRAY | CUTANEOUS | Status: DC | PRN

## 2018-02-21 MED ORDER — MISOPROSTOL 50MCG HALF TABLET: 50.0000 ug | ORAL_TABLET | Freq: Once | ORAL | Status: DC

## 2018-02-21 MED ORDER — ONDANSETRON HCL 4 MG/2ML IJ SOLN: 4.0000 mg | Freq: Four times a day (QID) | INTRAMUSCULAR | Status: DC | PRN

## 2018-02-21 MED ORDER — SOD CITRATE-CITRIC ACID 500-334 MG/5ML PO SOLN: 30.0000 mL | ORAL | Status: DC | PRN

## 2018-02-21 MED ORDER — LACTATED RINGERS IV SOLN
500.0000 mL | INTRAVENOUS | Status: DC | PRN
  Administered 2018-02-21: 1000 mL via INTRAVENOUS

## 2018-02-21 MED ORDER — OXYCODONE-ACETAMINOPHEN 5-325 MG PO TABS: 1.0000 | ORAL_TABLET | ORAL | Status: DC | PRN

## 2018-02-21 MED ORDER — MISOPROSTOL 200 MCG PO TABS
600.0000 ug | ORAL_TABLET | Freq: Once | ORAL | Status: AC
  Administered 2018-02-21: 600 ug via ORAL
  Filled 2018-02-21: qty 3

## 2018-02-21 MED ORDER — MISOPROSTOL 200 MCG PO TABS
ORAL_TABLET | ORAL | Status: AC
  Filled 2018-02-21: qty 4

## 2018-02-21 MED ORDER — FENTANYL CITRATE (PF) 100 MCG/2ML IJ SOLN
INTRAMUSCULAR | Status: AC
  Filled 2018-02-21: qty 2

## 2018-02-21 MED ORDER — SIMETHICONE 80 MG PO CHEW: 80.0000 mg | CHEWABLE_TABLET | ORAL | Status: DC | PRN

## 2018-02-21 MED ORDER — FLEET ENEMA 7-19 GM/118ML RE ENEM: 1.0000 | ENEMA | RECTAL | Status: DC | PRN

## 2018-02-21 MED ORDER — TRANEXAMIC ACID 1000 MG/10ML IV SOLN
1000.0000 mg | INTRAVENOUS | Status: AC
  Administered 2018-02-21: 1000 mg via INTRAVENOUS
  Filled 2018-02-21: qty 10

## 2018-02-21 MED ORDER — FENTANYL CITRATE (PF) 100 MCG/2ML IJ SOLN
100.0000 ug | Freq: Once | INTRAMUSCULAR | Status: DC
  Administered 2018-02-21: 100 ug via INTRAVENOUS

## 2018-02-21 MED ORDER — SODIUM CHLORIDE 0.9 % IV SOLN
2.0000 g | Freq: Once | INTRAVENOUS | Status: DC
  Filled 2018-02-21: qty 2000

## 2018-02-21 MED ORDER — ACETAMINOPHEN 325 MG PO TABS
650.0000 mg | ORAL_TABLET | ORAL | Status: DC | PRN
  Administered 2018-02-21 – 2018-02-22 (×2): 650 mg via ORAL
  Filled 2018-02-21 (×2): qty 2

## 2018-02-21 MED ORDER — ACETAMINOPHEN 325 MG PO TABS: 650.0000 mg | ORAL_TABLET | ORAL | Status: DC | PRN

## 2018-02-21 MED ORDER — MEASLES, MUMPS & RUBELLA VAC ~~LOC~~ INJ
0.5000 mL | INJECTION | Freq: Once | SUBCUTANEOUS | Status: DC
  Filled 2018-02-21: qty 0.5

## 2018-02-21 MED ORDER — OXYTOCIN BOLUS FROM INFUSION
500.0000 mL | Freq: Once | INTRAVENOUS | Status: AC
  Administered 2018-02-21: 500 mL/h via INTRAVENOUS

## 2018-02-21 MED ORDER — IBUPROFEN 600 MG PO TABS
600.0000 mg | ORAL_TABLET | Freq: Four times a day (QID) | ORAL | Status: DC
  Administered 2018-02-21 – 2018-02-23 (×8): 600 mg via ORAL
  Filled 2018-02-21 (×8): qty 1

## 2018-02-21 MED ORDER — DIBUCAINE 1 % RE OINT: 1.0000 "application " | TOPICAL_OINTMENT | RECTAL | Status: DC | PRN

## 2018-02-21 MED ORDER — ONDANSETRON HCL 4 MG PO TABS: 4.0000 mg | ORAL_TABLET | ORAL | Status: DC | PRN

## 2018-02-21 MED ORDER — LACTATED RINGERS IV SOLN
INTRAVENOUS | Status: DC
  Administered 2018-02-21: 125 mL/h via INTRAVENOUS

## 2018-02-21 MED ORDER — SENNOSIDES-DOCUSATE SODIUM 8.6-50 MG PO TABS
2.0000 | ORAL_TABLET | ORAL | Status: DC
  Administered 2018-02-22 (×2): 2 via ORAL
  Filled 2018-02-21 (×2): qty 2

## 2018-02-21 MED ORDER — DIPHENHYDRAMINE HCL 25 MG PO CAPS: 25.0000 mg | ORAL_CAPSULE | Freq: Four times a day (QID) | ORAL | Status: DC | PRN

## 2018-02-21 MED ORDER — OXYCODONE-ACETAMINOPHEN 5-325 MG PO TABS: 2.0000 | ORAL_TABLET | ORAL | Status: DC | PRN

## 2018-02-21 MED ORDER — ONDANSETRON HCL 4 MG/2ML IJ SOLN: 4.0000 mg | INTRAMUSCULAR | Status: DC | PRN

## 2018-02-21 MED ORDER — ZOLPIDEM TARTRATE 5 MG PO TABS: 5.0000 mg | ORAL_TABLET | Freq: Every evening | ORAL | Status: DC | PRN

## 2018-02-21 NOTE — Progress Notes (Signed)
Called back to re-assess patient for bleeding. Passed several large clots, weighed to be 385 cc (total EBL 1085). Fundus remains firm. Pad and perineum with some small amounts of blood but no evidence of active bleeding. Given Cytotec 600 mg PO and TXA 1000 mg. Will continue to monitor.   Phill Myron, D.O. OB Fellow  02/21/2018, 1:07 PM

## 2018-02-21 NOTE — H&P (Signed)
LABOR AND DELIVERY ADMISSION HISTORY AND PHYSICAL NOTE  Erin Jacobs is a 22 y.o. female [phone] with IUP at [redacted]w[redacted]d by 2nd trimester sono presenting for SOL. ROM occurred while transferring from stretcher to L&D bed with clear fluid.  She reports positive fetal movement. She denies vaginal bleeding.  Prenatal History/Complications: PNC at [facility] Pregnancy complications:  - UTI during third trimester -anemia  -vit d deficiency  -familial hypospadias of penis   Past Medical History: Past Medical History:  Diagnosis Date  . Medical history non-contributory   . Vaginal Pap smear, abnormal     Past Surgical History: Past Surgical History:  Procedure Laterality Date  . leg suregry- childhood      Obstetrical History: OB History    Gravida  4   Para  2   Term  2   Preterm  0   AB  1   Living  2     SAB  1   TAB  0   Ectopic  0   Multiple  0   Live Births  2           Social History: Social History   Socioeconomic History  . Marital status: Single    Spouse name: Not on file  . Number of children: Not on file  . Years of education: Not on file  . Highest education level: Not on file  Occupational History  . Not on file  Social Needs  . Financial resource strain: Not on file  . Food insecurity:    Worry: Not on file    Inability: Not on file  . Transportation needs:    Medical: Not on file    Non-medical: Not on file  Tobacco Use  . Smoking status: Never Smoker  . Smokeless tobacco: Never Used  Substance and Sexual Activity  . Alcohol use: No  . Drug use: Not Currently  . Sexual activity: Yes    Birth control/protection: None  Lifestyle  . Physical activity:    Days per week: Not on file    Minutes per session: Not on file  . Stress: Not on file  Relationships  . Social connections:    Talks on phone: Not on file    Gets together: Not on file    Attends religious service: Not on file    Active member of club or organization: Not  on file    Attends meetings of clubs or organizations: Not on file    Relationship status: Not on file  Other Topics Concern  . Not on file  Social History Narrative  . Not on file    Family History: Family History  Problem Relation Age of Onset  . Diabetes Maternal Aunt   . Diabetes Maternal Uncle     Allergies: No Known Allergies  Medications Prior to Admission  Medication Sig Dispense Refill Last Dose  . acetaminophen (TYLENOL) 500 MG tablet Take 1,000 mg by mouth every 6 (six) hours as needed for mild pain or headache.   unk at prn  . docusate sodium (COLACE) 100 MG capsule Take 1 capsule (100 mg total) by mouth 2 (two) times daily as needed. (Patient not taking: Reported on 01/28/2018) 30 capsule 2 Not Taking at Unknown time  . Elastic Bandages & Supports (WRIST BRACE/LEFT MEDIUM) MISC 1 Device by Does not apply route daily. 1 each 0   . Elastic Bandages & Supports (WRIST BRACE/RIGHT MEDIUM) MISC 1 Device by Does not apply route daily. 1 each  0   . ferrous sulfate (FERROUSUL) 325 (65 FE) MG tablet Take 1 tablet (325 mg total) by mouth 2 (two) times daily. 60 tablet 1 01/27/2018  . Prenat-Fe Poly-Methfol-FA-DHA (VITAFOL ULTRA) 29-0.6-0.4-200 MG CAPS Take 1 capsule by mouth daily before breakfast. (Patient taking differently: Take 2 capsules by mouth daily before breakfast. ) 90 capsule 4 01/27/2018 at Unknown time  . Prenatal-DSS-FeCb-FeGl-FA (CITRANATAL BLOOM) 90-1 MG TABS Take 1 tablet by mouth daily. (Patient not taking: Reported on 01/28/2018) 30 tablet 12 Not Taking at Unknown time  . Vitamin D, Ergocalciferol, (DRISDOL) 50000 units CAPS capsule Take 1 capsule (50,000 Units total) by mouth every 7 (seven) days. (Patient not taking: Reported on 12/28/2017) 30 capsule 2 Not Taking at Unknown time     Review of Systems  All systems reviewed and negative except as stated in HPI  Physical Exam Blood pressure 120/77, pulse 92, temperature 98.3 F (36.8 C), temperature source Oral,  resp. rate 20, height 5\' 7"  (1.702 m), weight 102.1 kg, last menstrual period 04/05/2017, SpO2 100 %, not currently breastfeeding. General appearance: alert, oriented, NAD Lungs: normal respiratory effort Heart: regular rate Abdomen: soft, non-tender; gravid, FH appropriate for GA Extremities: No calf swelling or tenderness Presentation: cephalic Fetal monitoring: 130 bpm, good variability, +acels, early decels  Uterine activity: q1-2 minutes  Dilation: Lip/rim Effacement (%): 80 Station: -1 Exam by:: Mabeline Caras, RN  Prenatal labs: ABO, Rh: --/--/B POS (08/08 1023) Antibody: NEG (08/08 1023) Rubella: 10.70 (04/09 1526) RPR: Non Reactive (05/07 1106)  HBsAg: Negative (04/09 1526)  HIV: Non Reactive (05/07 1106)  GC/Chlamydia: Negative  GBS:   Negative 2-hr GTT: Normal  Genetic screening:  Negative  Anatomy US: Normal with limited views, f/u normal   Prenatal Transfer Tool  Maternal Diabetes: No Genetic Screening: Normal Maternal Ultrasounds/Referrals: Normal Fetal Ultrasounds or other Referrals:  None Maternal Substance Abuse:  No Significant Maternal Medications:  None Significant Maternal Lab Results: None  Results for orders placed or performed during the hospital encounter of 02/21/18 (from the past 24 hour(s))  CBC   Collection Time: 02/21/18 10:23 AM  Result Value Ref Range   WBC 8.6 4.0 - 10.5 K/uL   RBC 4.03 3.87 - 5.11 MIL/uL   Hemoglobin 9.3 (L) 12.0 - 15.0 g/dL   HCT 30.1 (L) 36.0 - 46.0 %   MCV 74.7 (L) 78.0 - 100.0 fL   MCH 23.1 (L) 26.0 - 34.0 pg   MCHC 30.9 30.0 - 36.0 g/dL   RDW 19.7 (H) 11.5 - 15.5 %   Platelets 326 150 - 400 K/uL  Type and screen [place]   Collection Time: 02/21/18 10:23 AM  Result Value Ref Range   ABO/RH(D) B POS    Antibody Screen NEG    Sample Expiration      02/24/2018 Performed at [facility], [address] [place], [place] [address]     Patient Active Problem List   Diagnosis Date  Noted  . UTI (urinary tract infection) during pregnancy, third trimester 11/27/2017  . Anemia in pregnancy 11/27/2017  . Vitamin D deficiency 10/24/2017  . Maternal varicella, non-immune 10/23/2017  . Familial hypospadias of penis 10/23/2017  . Supervision of normal pregnancy in second trimester 10/12/2017    Assessment: Erin Jacobs is a 22 y.o. [id number] at [redacted]w[redacted]d here for SOL/SROM.   #Labor: Expectant management  #Pain: Patient declines epidural  #FWB: CAT I  #ID:  GBS neg on 8/6, AmpX1 given due to banner listing  GBS status as + #MOF: Breast  #MOC: Patch   Melina Schools 02/21/2018, 11:30 AM

## 2018-02-21 NOTE — MAU Note (Signed)
Pt came in by EMS, C/O uc's since yesterday afternoon, became more intense & frequent during the night.  Has been up all night.  Denies bleeding or LOF, having a lot of discharge.  Reports good fetal movement.

## 2018-02-21 NOTE — Progress Notes (Signed)
Patient feeling the urge to push. Cervical exam reveals large anterior lip, not reducible. Will try peanut and re-positioning.   Phill Myron, D.O. OB Fellow  02/21/2018, 11:50 AM

## 2018-02-21 NOTE — Progress Notes (Signed)
Patient has called out and said she had to push twice.  Cervical check done and patient still has cervix present.

## 2018-02-22 LAB — CBC
HEMATOCRIT: 22.5 % — AB (ref 36.0–46.0)
HEMOGLOBIN: 7 g/dL — AB (ref 12.0–15.0)
MCH: 23.2 pg — ABNORMAL LOW (ref 26.0–34.0)
MCHC: 31.1 g/dL (ref 30.0–36.0)
MCV: 74.5 fL — AB (ref 78.0–100.0)
Platelets: 285 10*3/uL (ref 150–400)
RBC: 3.02 MIL/uL — ABNORMAL LOW (ref 3.87–5.11)
RDW: 19.8 % — ABNORMAL HIGH (ref 11.5–15.5)
WBC: 13 10*3/uL — AB (ref 4.0–10.5)

## 2018-02-22 LAB — RPR: RPR: NONREACTIVE

## 2018-02-22 MED ORDER — SODIUM CHLORIDE 0.9 % IV SOLN
510.0000 mg | Freq: Once | INTRAVENOUS | Status: DC
  Filled 2018-02-22: qty 17

## 2018-02-22 NOTE — Progress Notes (Signed)
Post Partum Day 1 Subjective: Erin Jacobs is a 22 year old female [phone] who is PPD#1 after a SVD @ 11:54am on 02/21/18. She reports mild dizziness and abdominal cramping when she stands up but has had no recent vision changes, headaches, chest pain, or SOB. She hasn't yet had a bowel movement but is passing gas and voiding normally. She is able to get up and ambulate, eat, and drink normally. Her bleeding start lightening up last night and has continued to lighten.   Objective: Blood pressure 122/63, pulse (!) 103, temperature 99.6 F (37.6 C), temperature source Oral, resp. rate 18, height 5\' 7"  (1.702 m), weight 102.1 kg, last menstrual period 04/05/2017, SpO2 97 %, plans to bottle-feed.   Physical Exam:  General: alert, cooperative, appears stated age and no distress Lochia: appropriate Uterine Fundus: firm Incision: n/a DVT Evaluation: No evidence of DVT seen on physical exam.  Recent Labs    02/21/18 1023  HGB 9.3*  HCT 30.1*   Assessment/Plan: Contraception Patch - has patch already at home; if patch doesn't work for her, wants to try Depo  Plans to bottle feed Discharge home tomorrow     LOS: 1 day   Rockey Situ 02/22/2018, 7:35 AM

## 2018-02-22 NOTE — Progress Notes (Signed)
MD notified of patient not having an IV and does not want the iron infusion.  Stated this was okay, will give oral iron instead.   Erin Jacobs, [place]

## 2018-02-22 NOTE — Discharge Instructions (Signed)
Vaginal Delivery, Care After °Refer to this sheet in the next few weeks. These instructions provide you with information about caring for yourself after vaginal delivery. Your health care provider may also give you more specific instructions. Your treatment has been planned according to current medical practices, but problems sometimes occur. Call your health care provider if you have any problems or questions. °What can I expect after the procedure? °After vaginal delivery, it is common to have: °· Some bleeding from your vagina. °· Soreness in your abdomen, your vagina, and the area of skin between your vaginal opening and your anus (perineum). °· Pelvic cramps. °· Fatigue. ° °Follow these instructions at home: °Medicines °· Take over-the-counter and prescription medicines only as told by your health care provider. °· If you were prescribed an antibiotic medicine, take it as told by your health care provider. Do not stop taking the antibiotic until it is finished. °Driving ° °· Do not drive or operate heavy machinery while taking prescription pain medicine. °· Do not drive for 24 hours if you received a sedative. °Lifestyle °· Do not drink alcohol. This is especially important if you are breastfeeding or taking medicine to relieve pain. °· Do not use tobacco products, including cigarettes, chewing tobacco, or e-cigarettes. If you need help quitting, ask your health care provider. °Eating and drinking °· Drink at least 8 eight-ounce glasses of water every day unless you are told not to by your health care provider. If you choose to breastfeed your baby, you may need to drink more water than this. °· Eat high-fiber foods every day. These foods may help prevent or relieve constipation. High-fiber foods include: °? Whole grain cereals and breads. °? Brown rice. °? Beans. °? Fresh fruits and vegetables. °Activity °· Return to your normal activities as told by your health care provider. Ask your health care provider  what activities are safe for you. °· Rest as much as possible. Try to rest or take a nap when your baby is sleeping. °· Do not lift anything that is heavier than your baby or 10 lb (4.5 kg) until your health care provider says that it is safe. °· Talk with your health care provider about when you can engage in sexual activity. This may depend on your: °? Risk of infection. °? Rate of healing. °? Comfort and desire to engage in sexual activity. °Vaginal Care °· If you have an episiotomy or a vaginal tear, check the area every day for signs of infection. Check for: °? More redness, swelling, or pain. °? More fluid or blood. °? Warmth. °? Pus or a bad smell. °· Do not use tampons or douches until your health care provider says this is safe. °· Watch for any blood clots that may pass from your vagina. These may look like clumps of dark red, brown, or black discharge. °General instructions °· Keep your perineum clean and dry as told by your health care provider. °· Wear loose, comfortable clothing. °· Wipe from front to back when you use the toilet. °· Ask your health care provider if you can shower or take a bath. If you had an episiotomy or a perineal tear during labor and delivery, your health care provider may tell you not to take baths for a certain length of time. °· Wear a bra that supports your breasts and fits you well. °· If possible, have someone help you with household activities and help care for your baby for at least a few days after   you leave the hospital. °· Keep all follow-up visits for you and your baby as told by your health care provider. This is important. °Contact a health care provider if: °· You have: °? Vaginal discharge that has a bad smell. °? Difficulty urinating. °? Pain when urinating. °? A sudden increase or decrease in the frequency of your bowel movements. °? More redness, swelling, or pain around your episiotomy or vaginal tear. °? More fluid or blood coming from your episiotomy or  vaginal tear. °? Pus or a bad smell coming from your episiotomy or vaginal tear. °? A fever. °? A rash. °? Little or no interest in activities you used to enjoy. °? Questions about caring for yourself or your baby. °· Your episiotomy or vaginal tear feels warm to the touch. °· Your episiotomy or vaginal tear is separating or does not appear to be healing. °· Your breasts are painful, hard, or turn red. °· You feel unusually sad or worried. °· You feel nauseous or you vomit. °· You pass large blood clots from your vagina. If you pass a blood clot from your vagina, save it to show to your health care provider. Do not flush blood clots down the toilet without having your health care provider look at them. °· You urinate more than usual. °· You are dizzy or light-headed. °· You have not breastfed at all and you have not had a menstrual period for 12 weeks after delivery. °· You have stopped breastfeeding and you have not had a menstrual period for 12 weeks after you stopped breastfeeding. °Get help right away if: °· You have: °? Pain that does not go away or does not get better with medicine. °? Chest pain. °? Difficulty breathing. °? Blurred vision or spots in your vision. °? Thoughts about hurting yourself or your baby. °· You develop pain in your abdomen or in one of your legs. °· You develop a severe headache. °· You faint. °· You bleed from your vagina so much that you fill two sanitary pads in one hour. °This information is not intended to replace advice given to you by your health care provider. Make sure you discuss any questions you have with your health care provider. °Document Released: 06/30/2000 Document Revised: 12/15/2015 Document Reviewed: 07/18/2015 °Elsevier Interactive Patient Education © 2018 [employer] ° °

## 2018-02-23 MED ORDER — IBUPROFEN 600 MG PO TABS: 600.0000 mg | ORAL_TABLET | Freq: Four times a day (QID) | ORAL | 0 refills | Status: DC | PRN

## 2018-02-23 NOTE — Discharge Summary (Signed)
OB Discharge Summary     Patient Name: Erin Jacobs DOB: May 17, 1996 MRN: [id number]  Date of admission: 02/21/2018 Delivering MD: Nicolette Bang   Date of discharge: 02/23/2018  Admitting diagnosis: 40WKS,LABOR Intrauterine pregnancy: [redacted]w[redacted]d     Secondary diagnosis:  Active Problems:   Supervision of normal pregnancy in second trimester   Maternal varicella, non-immune   Vitamin D deficiency   UTI (urinary tract infection) during pregnancy, third trimester   Anemia in pregnancy  Additional problems: none     Discharge diagnosis: Term Pregnancy Delivered, Anemia and PPH                                                                                                Post partum procedures:rec feraheme or blood tx- pt declined due to no IV access  Augmentation: none  Complications: None and [id number]  Hospital course:  Onset of Labor With Vaginal Delivery     22 y.o. yo [id number] at [redacted]w[redacted]d was admitted in Active Labor on 02/21/2018. Patient had a precipitous labor course. With delivery she had some large gushes of blood giving a delivery EBL of 700cc. Within the hour, provider called to assess passage of additional clots. Total EBL now 1200cc. Given cytotec and TXA with good results. Membrane Rupture Time/Date: 10:48 AM ,02/21/2018   Intrapartum Procedures: Episiotomy: None [1]                                         Lacerations:  None [1]  Patient had a delivery of a Viable infant. 02/21/2018  Information for the patient's newborn:  Lorrine, Killilea [[id number]]  Delivery Method: Vaginal, Spontaneous(Filed from Delivery Summary)    Pateint had an uncomplicated postpartum course once on Mother/Baby Unit. On PPD#1 it was recommended she receive IV Feraheme but she declined due to no IV access and not feeling symptomatic.  She is ambulating, tolerating a regular diet, passing flatus, and urinating well. On PPD#2 she states she feels a 'little' dizzy, no tinnitus. Again  recommended IV Feraheme or blood tx- pt declined both and preferred po iron. Patient is discharged home in stable condition on 02/23/18.   Physical exam  Vitals:   02/22/18 0847 02/22/18 1431 02/22/18 2148 02/23/18 0525  BP: (!) 103/48 120/72 120/71 (!) 104/52  Pulse: 93 90 94 84  Resp:  18 16 18   Temp: 98.2 F (36.8 C) 98 F (36.7 C) 98.6 F (37 C) 97.9 F (36.6 C)  TempSrc: Oral Oral Oral Oral  SpO2:  100% 100%   Weight:      Height:       General: alert and cooperative Lochia: appropriate Uterine Fundus: firm Incision: N/A DVT Evaluation: No evidence of DVT seen on physical exam. Labs: Lab Results  Component Value Date   WBC 13.0 (H) 02/22/2018   HGB 7.0 (L) 02/22/2018   HCT 22.5 (L) 02/22/2018   MCV 74.5 (L) 02/22/2018   PLT 285 02/22/2018   CMP Latest Ref Rng & Units  03/07/2017  Glucose 65 - 99 mg/dL 91  BUN 6 - 20 mg/dL 3(L)  Creatinine 0.44 - 1.00 mg/dL 1.00  Sodium 135 - 145 mmol/L 142  Potassium 3.5 - 5.1 mmol/L 3.3(L)  Chloride 101 - 111 mmol/L 106  CO2 22 - 32 mmol/L -  Calcium 8.9 - 10.3 mg/dL -  Total Protein 6.5 - 8.1 g/dL -  Total Bilirubin 0.3 - 1.2 mg/dL -  Alkaline Phos 38 - 126 U/L -  AST 15 - 41 U/L -  ALT 14 - 54 U/L -    Discharge instruction: per After Visit Summary and "Baby and Me Booklet".  After visit meds:  Allergies as of 02/23/2018   No Known Allergies     Medication List    STOP taking these medications   acetaminophen 500 MG tablet Commonly known as:  TYLENOL   diphenhydramine-acetaminophen 25-500 MG Tabs tablet Commonly known as:  TYLENOL PM   docusate sodium 100 MG capsule Commonly known as:  COLACE   Wrist Brace/Left Medium Misc   Wrist Brace/Right Medium Misc     TAKE these medications   ferrous sulfate 325 (65 FE) MG tablet Take 1 tablet (325 mg total) by mouth 2 (two) times daily.   ibuprofen 600 MG tablet Commonly known as:  ADVIL,MOTRIN Take 1 tablet (600 mg total) by mouth every 6 (six) hours as  needed.   VITAFOL ULTRA 29-0.6-0.4-200 MG Caps Take 1 capsule by mouth daily before breakfast. What changed:  how much to take   Vitamin D (Ergocalciferol) 50000 units Caps capsule Commonly known as:  DRISDOL Take 1 capsule (50,000 Units total) by mouth every 7 (seven) days.       Diet: routine diet  Activity: Advance as tolerated. Pelvic rest for 6 weeks.   Outpatient follow up:4 weeks Follow up Appt: Future Appointments  Date Time Provider [place]  03/25/2018  1:00 PM Constant, Vickii Chafe, MD [place] None   Follow up Visit:No follow-ups on file.  Postpartum contraception: Ortho Evra- to start at 4-6wks PP  Newborn Data: Live born female  Birth Weight: 6 lb 5.6 oz (2880 g) APGAR: 7, 8  Newborn Delivery   Birth date/time:  02/21/2018 11:54:00 Delivery type:  Vaginal, Spontaneous     Baby Feeding: Bottle Disposition:home with mother   02/23/2018 Serita Grammes, CNM  7:01 AM

## 2018-02-26 ENCOUNTER — Inpatient Hospital Stay (HOSPITAL_COMMUNITY): Admission: RE | Admit: 2018-02-26 | Payer: Self-pay | Source: Ambulatory Visit

## 2018-03-22 ENCOUNTER — Ambulatory Visit: Payer: Self-pay | Admitting: Advanced Practice Midwife

## 2018-03-25 ENCOUNTER — Ambulatory Visit: Payer: Self-pay | Admitting: Obstetrics and Gynecology

## 2018-04-09 ENCOUNTER — Telehealth: Payer: Self-pay | Admitting: *Deleted

## 2018-04-09 NOTE — Telephone Encounter (Signed)
Attempted to call patient to on 04/09/18 to reschedule missed PP visit on 04/04/18  and no answer, unable to leave a voicemail.Marland KitchenMarland Kitchen

## 2018-11-30 ENCOUNTER — Other Ambulatory Visit: Payer: Self-pay

## 2018-11-30 ENCOUNTER — Ambulatory Visit (HOSPITAL_COMMUNITY)
Admission: EM | Admit: 2018-11-30 | Discharge: 2018-11-30 | Disposition: A | Discharge status: Home / Self Care | Payer: Medicaid Other | Attending: Emergency Medicine | Admitting: Emergency Medicine

## 2018-11-30 ENCOUNTER — Encounter (HOSPITAL_COMMUNITY): Payer: Self-pay | Admitting: Emergency Medicine

## 2018-11-30 DIAGNOSIS — Z3201 Encounter for pregnancy test, result positive: Secondary | ICD-10-CM | POA: Insufficient documentation

## 2018-11-30 DIAGNOSIS — N390 Urinary tract infection, site not specified: Secondary | ICD-10-CM | POA: Insufficient documentation

## 2018-11-30 LAB — POCT URINALYSIS DIP (DEVICE)
Bilirubin Urine: NEGATIVE
Glucose, UA: NEGATIVE mg/dL
Ketones, ur: NEGATIVE mg/dL
Nitrite: NEGATIVE
Protein, ur: 30 mg/dL — AB
Specific Gravity, Urine: >=1.03 (ref 1.005–1.030)
Urobilinogen, UA: 0.2 mg/dL (ref 0.0–1.0)
pH: 6 (ref 5.0–8.0)

## 2018-11-30 LAB — POCT PREGNANCY, URINE: Preg Test, Ur: POSITIVE — AB

## 2018-11-30 MED ORDER — CEPHALEXIN 500 MG PO CAPS: 500.0000 mg | ORAL_CAPSULE | Freq: Two times a day (BID) | ORAL | 0 refills | Status: AC

## 2018-11-30 MED ORDER — PRENATAL COMPLETE 14-0.4 MG PO TABS: 1.0000 | ORAL_TABLET | Freq: Every day | ORAL | 0 refills | Status: DC

## 2018-11-30 NOTE — Discharge Instructions (Signed)
Complete course of antibiotics.  Drink plenty of water to empty bladder regularly. Avoid alcohol and caffeine as these may irritate the bladder.   Your pregnancy test is positive today. Based on period of 10/24/18 you are 5 w 2d, due 07/31/2019 Please start daily prenatal vitamin.  Avoid alcohol or smoking.  Please follow up with OB.  If develop bleeding, increased pain, fevers or otherwise worsening please go to the ER.

## 2018-11-30 NOTE — ED Provider Notes (Signed)
[place]    CSN: [id number] Arrival date & time: 11/30/18  1031     History   Chief Complaint Chief Complaint  Patient presents with  . Dysuria    HPI Erin Jacobs is a 23 y.o. female.   Erin Jacobs presents with complaints of burning frequency and urgency with urination. Started two weeks ago. Hasn't worsened. Some pelvic cramping. No gi complaints. No back pain. No vaginal symptoms. No vaginal bleeding. No fevers. Has been drinking cranberry juice. LMP 4/9. States her periods have been irregular so not unusual for her to have not had a period yet. She is sexually active, not on birth control and doesn't regularly use condoms. States has three living children.     ROS per HPI, negative if not otherwise mentioned.      Past Medical History:  Diagnosis Date  . Medical history non-contributory   . Vaginal Pap smear, abnormal     Patient Active Problem List   Diagnosis Date Noted  . UTI (urinary tract infection) during pregnancy, third trimester 11/27/2017  . Anemia in pregnancy 11/27/2017  . Vitamin D deficiency 10/24/2017  . Maternal varicella, non-immune 10/23/2017  . Familial hypospadias of penis 10/23/2017  . Supervision of normal pregnancy in second trimester 10/12/2017    Past Surgical History:  Procedure Laterality Date  . leg suregry- childhood      OB History    Gravida  4   Para  3   Term  3   Preterm  0   AB  1   Living  3     SAB  1   TAB  0   Ectopic  0   Multiple  0   Live Births  3            Home Medications    Prior to Admission medications   Medication Sig Start Date End Date Taking? Authorizing Provider  cephALEXin (KEFLEX) 500 MG capsule Take 1 capsule (500 mg total) by mouth 2 (two) times daily for 7 days. 11/30/18 12/07/18  Zigmund Gottron, NP  ferrous sulfate (FERROUSUL) 325 (65 FE) MG tablet Take 1 tablet (325 mg total) by mouth 2 (two) times daily. 12/28/17   Lajean Manes, CNM   ibuprofen (ADVIL,MOTRIN) 600 MG tablet Take 1 tablet (600 mg total) by mouth every 6 (six) hours as needed. 02/23/18   Myrtis Ser, CNM  Prenat-Fe Poly-Methfol-FA-DHA (VITAFOL ULTRA) 29-0.6-0.4-200 MG CAPS Take 1 capsule by mouth daily before breakfast. Patient taking differently: Take 2 capsules by mouth daily before breakfast.  09/30/17   Tereasa Coop, PA-C  Prenatal Vit-Fe Fumarate-FA (PRENATAL COMPLETE) 14-0.4 MG TABS Take 1 tablet by mouth daily. 11/30/18   Zigmund Gottron, NP  Vitamin D, Ergocalciferol, (DRISDOL) 50000 units CAPS capsule Take 1 capsule (50,000 Units total) by mouth every 7 (seven) days. Patient not taking: Reported on 12/28/2017 10/24/17   Morene Crocker, CNM    Family History Family History  Problem Relation Age of Onset  . Diabetes Maternal Aunt   . Diabetes Maternal Uncle     Social History Social History   Tobacco Use  . Smoking status: Never Smoker  . Smokeless tobacco: Never Used  Substance Use Topics  . Alcohol use: No  . Drug use: Not Currently     Allergies   Patient has no known allergies.   Review of Systems Review of Systems   Physical Exam Triage Vital Signs ED Triage Vitals  Enc Vitals Group     BP 11/30/18 1043 120/64     Pulse Rate 11/30/18 1043 78     Resp 11/30/18 1043 18     Temp 11/30/18 1043 99.1 F (37.3 C)     Temp src --      SpO2 11/30/18 1043 100 %     Weight --      Height --      Head Circumference --      Peak Flow --      Pain Score 11/30/18 1042 10     Pain Loc --      Pain Edu? --      Excl. in [place]? --    No data found.  Updated Vital Signs BP 120/64 (BP Location: Left Arm)   Pulse 78   Temp 99.1 F (37.3 C)   Resp 18   LMP 10/24/2018   SpO2 100%   Visual Acuity Right Eye Distance:   Left Eye Distance:   Bilateral Distance:    Right Eye Near:   Left Eye Near:    Bilateral Near:     Physical Exam Constitutional:      General: She is not in acute distress.    Appearance: She is  well-developed.  Cardiovascular:     Rate and Rhythm: Normal rate and regular rhythm.     Heart sounds: Normal heart sounds.  Pulmonary:     Effort: Pulmonary effort is normal.     Breath sounds: Normal breath sounds.  Abdominal:     Tenderness: There is no abdominal tenderness.  Genitourinary:    Comments: Denies sores, lesions, vaginal bleeding; no pelvic pain Skin:    General: Skin is warm and dry.  Neurological:     Mental Status: She is alert and oriented to person, place, and time.      UC Treatments / Results  Labs (all labs ordered are listed, but only abnormal results are displayed) Labs Reviewed  POCT URINALYSIS DIP (DEVICE) - Abnormal; Notable for the following components:      Result Value   Hgb urine dipstick LARGE (*)    Protein, ur 30 (*)    Leukocytes,Ua SMALL (*)    All other components within normal limits  POCT PREGNANCY, URINE - Abnormal; Notable for the following components:   Preg Test, Ur POSITIVE (*)    All other components within normal limits  URINE CULTURE  POC URINE PREG, ED    EKG None  Radiology No results found.  Procedures Procedures (including critical care time)  Medications Ordered in UC Medications - No data to display  Initial Impression / Assessment and Plan / UC Course  I have reviewed the triage vital signs and the nursing notes.  Pertinent labs & imaging results that were available during my care of the patient were reviewed by me and considered in my medical decision making (see chart for details).     Urine is consistent with UTI at this time, culture obtained and pending. Treatment initiated. Positive pregnancy today, approximately 5w 2d based on 4/9 period. Patient states she is married, has support at home, as she is upset about news of positive test today. Encouraged follow up with OB. Prenatal provided. Patient verbalized understanding and agreeable to plan.    Final Clinical Impressions(s) / UC Diagnoses    Final diagnoses:  Lower urinary tract infectious disease  Positive pregnancy test     Discharge Instructions     Complete course of antibiotics.  Drink plenty of water to empty bladder regularly. Avoid alcohol and caffeine as these may irritate the bladder.   Your pregnancy test is positive today. Based on period of 10/24/18 you are 5 w 2d, due 07/31/2019 Please start daily prenatal vitamin.  Avoid alcohol or smoking.  Please follow up with OB.  If develop bleeding, increased pain, fevers or otherwise worsening please go to the ER.    ED Prescriptions    Medication Sig Dispense Auth. Provider   cephALEXin (KEFLEX) 500 MG capsule Take 1 capsule (500 mg total) by mouth 2 (two) times daily for 7 days. 14 capsule Augusto Gamble B, NP   Prenatal Vit-Fe Fumarate-FA (PRENATAL COMPLETE) 14-0.4 MG TABS Take 1 tablet by mouth daily. 60 each Zigmund Gottron, NP     Controlled Substance Prescriptions [place] Controlled Substance Registry consulted? Not Applicable   Zigmund Gottron, NP 11/30/18 1140

## 2018-11-30 NOTE — ED Triage Notes (Addendum)
Pt presents to [facility] for assessment of burning at the end of urination for a few weeks.  Pt states she had some itching as well so she treated with monestat.  Itching has resolved, burning is still there.

## 2018-12-02 LAB — URINE CULTURE: Culture: 30000 — AB

## 2019-06-30 ENCOUNTER — Other Ambulatory Visit: Payer: Self-pay

## 2019-06-30 ENCOUNTER — Ambulatory Visit (HOSPITAL_COMMUNITY)
Admission: EM | Admit: 2019-06-30 | Discharge: 2019-06-30 | Disposition: A | Discharge status: Home / Self Care | Payer: Self-pay | Attending: Family Medicine | Admitting: Family Medicine

## 2019-06-30 ENCOUNTER — Encounter (HOSPITAL_COMMUNITY): Payer: Self-pay | Admitting: Family Medicine

## 2019-06-30 DIAGNOSIS — K0889 Other specified disorders of teeth and supporting structures: Secondary | ICD-10-CM

## 2019-06-30 MED ORDER — AMOXICILLIN 500 MG PO CAPS: 1000.0000 mg | ORAL_CAPSULE | Freq: Two times a day (BID) | ORAL | 0 refills | Status: AC

## 2019-06-30 NOTE — Discharge Instructions (Addendum)
Take the medication as prescribed, you can continue the ibuprofen.  Follow up with a dentist.

## 2019-06-30 NOTE — ED Triage Notes (Signed)
Patient reports right upper molar hurts, reports history of tooth breakage. Pain for 1 week, states worsened yesterday. Didn't go to work yesterday, needs note today.

## 2019-06-30 NOTE — ED Provider Notes (Signed)
[place]    CSN: [id number] Arrival date & time: 06/30/19  [id number]      History   Chief Complaint Chief Complaint  Patient presents with  . Dental Pain    HPI Erin Jacobs is a 23 y.o. female.   Patient is a 23 year old female presents today with right upper dental pain.  Symptoms have been constant for the past week.  Symptoms worsened yesterday.  She has broken tooth today right upper molar area.  Mild gingival swelling.  She is to take ibuprofen for the pain with some relief.  She is also been doing warm salt water rinses and rinsing with mouthwash.  No fevers, chills, malaise or night sweats.  No trismus or drooling. No injury to the mouth   ROS per HPI      Past Medical History:  Diagnosis Date  . Medical history non-contributory   . Vaginal Pap smear, abnormal     Patient Active Problem List   Diagnosis Date Noted  . UTI (urinary tract infection) during pregnancy, third trimester 11/27/2017  . Anemia in pregnancy 11/27/2017  . Vitamin D deficiency 10/24/2017  . Maternal varicella, non-immune 10/23/2017  . Familial hypospadias of penis 10/23/2017  . Supervision of normal pregnancy in second trimester 10/12/2017    Past Surgical History:  Procedure Laterality Date  . leg suregry- childhood      OB History    Gravida  4   Para  3   Term  3   Preterm  0   AB  1   Living  3     SAB  1   TAB  0   Ectopic  0   Multiple  0   Live Births  3            Home Medications    Prior to Admission medications   Medication Sig Start Date End Date Taking? Authorizing Provider  amoxicillin (AMOXIL) 500 MG capsule Take 2 capsules (1,000 mg total) by mouth 2 (two) times daily for 7 days. 06/30/19 07/07/19  Loura Halt A, NP  ferrous sulfate (FERROUSUL) 325 (65 FE) MG tablet Take 1 tablet (325 mg total) by mouth 2 (two) times daily. 12/28/17 06/30/19  Lajean Manes, CNM    Family History Family History  Problem Relation Age of  Onset  . Diabetes Maternal Aunt   . Diabetes Maternal Uncle     Social History Social History   Tobacco Use  . Smoking status: Never Smoker  . Smokeless tobacco: Never Used  Substance Use Topics  . Alcohol use: No  . Drug use: Not Currently     Allergies   Patient has no known allergies.   Review of Systems Review of Systems   Physical Exam Triage Vital Signs ED Triage Vitals  Enc Vitals Group     BP 06/30/19 0829 122/80     Pulse Rate 06/30/19 0829 74     Resp 06/30/19 0829 17     Temp 06/30/19 0829 98.5 F (36.9 C)     Temp Source 06/30/19 0829 Oral     SpO2 06/30/19 0829 100 %     Weight --      Height --      Head Circumference --      Peak Flow --      Pain Score 06/30/19 0827 7     Pain Loc --      Pain Edu? --      Excl. in  [place]? --    No data found.  Updated Vital Signs BP 122/80 (BP Location: Left Arm)   Pulse 74   Temp 98.5 F (36.9 C) (Oral)   Resp 17   LMP 06/30/2019   SpO2 100%   Visual Acuity Right Eye Distance:   Left Eye Distance:   Bilateral Distance:    Right Eye Near:   Left Eye Near:    Bilateral Near:     Physical Exam Vitals and nursing note reviewed.  Constitutional:      General: She is not in acute distress.    Appearance: Normal appearance. She is not ill-appearing, toxic-appearing or diaphoretic.  HENT:     Head: Normocephalic.     Nose: Nose normal.     Mouth/Throat:     Pharynx: Oropharynx is clear.      Comments: Broken tooth. Mild gingival swelling without abscess Eyes:     Conjunctiva/sclera: Conjunctivae normal.  Pulmonary:     Effort: Pulmonary effort is normal.  Musculoskeletal:        General: Normal range of motion.     Cervical back: Normal range of motion.  Skin:    General: Skin is warm and dry.     Findings: No rash.  Neurological:     Mental Status: She is alert.  Psychiatric:        Mood and Affect: Mood normal.      UC Treatments / Results  Labs (all labs ordered are listed, but  only abnormal results are displayed) Labs Reviewed - No data to display  EKG   Radiology No results found.  Procedures Procedures (including critical care time)  Medications Ordered in UC Medications - No data to display  Initial Impression / Assessment and Plan / UC Course  I have reviewed the triage vital signs and the nursing notes.  Pertinent labs & imaging results that were available during my care of the patient were reviewed by me and considered in my medical decision making (see chart for details).     Dental pain-treating with amoxicillin Ibuprofen as needed for pain.  She can follow with dentist as needed Dental resources given. Final Clinical Impressions(s) / UC Diagnoses   Final diagnoses:  Pain, dental     Discharge Instructions     Take the medication as prescribed, you can continue the ibuprofen.  Follow up with a dentist.     ED Prescriptions    Medication Sig Dispense Auth. Provider   amoxicillin (AMOXIL) 500 MG capsule Take 2 capsules (1,000 mg total) by mouth 2 (two) times daily for 7 days. 28 capsule Desirey Keahey A, NP     PDMP not reviewed this encounter.   Orvan July, NP 06/30/19 [phone]

## 2020-05-24 ENCOUNTER — Ambulatory Visit (INDEPENDENT_AMBULATORY_CARE_PROVIDER_SITE_OTHER): Payer: Self-pay

## 2020-05-24 ENCOUNTER — Other Ambulatory Visit: Payer: Self-pay

## 2020-05-24 VITALS — BP 113/72 | HR 81 | Ht 67.0 in | Wt 203.0 lb

## 2020-05-24 DIAGNOSIS — Z3480 Encounter for supervision of other normal pregnancy, unspecified trimester: Secondary | ICD-10-CM | POA: Insufficient documentation

## 2020-05-24 DIAGNOSIS — Z3201 Encounter for pregnancy test, result positive: Secondary | ICD-10-CM

## 2020-05-24 DIAGNOSIS — Z3A01 Less than 8 weeks gestation of pregnancy: Secondary | ICD-10-CM

## 2020-05-24 LAB — POCT URINE PREGNANCY: Preg Test, Ur: POSITIVE — AB

## 2020-05-24 MED ORDER — BLOOD PRESSURE KIT DEVI: 1.0000 | 0 refills | Status: DC

## 2020-05-24 NOTE — Progress Notes (Signed)
Erin Jacobs presents today for UPT. She has no unusual complaints.  LMP:04/06/2020    OBJECTIVE: Appears well, in no apparent distress.  OB History    Gravida  5   Para  3   Term  3   Preterm  0   AB  1   Living  3     SAB  1   TAB  0   Ectopic  0   Multiple  0   Live Births  3          Home UPT Result:POSITIVE In-Office UPT result: POSITIVE  I have reviewed the patient's medical, obstetrical, social, and family histories, and medications.   ASSESSMENT: Positive pregnancy test  PLAN Prenatal care to be completed at: [facility]  --------------------------------------------------------  PRENATAL INTAKE SUMMARY  Erin Jacobs presents today New OB Nurse Interview.  OB History    Gravida  5   Para  3   Term  3   Preterm  0   AB  1   Living  3     SAB  1   TAB  0   Ectopic  0   Multiple  0   Live Births  3          I have reviewed the patient's medical, obstetrical, social, and family histories, medications, and available lab results.  SUBJECTIVE She has no unusual complaints  OBJECTIVE Initial Physical Exam (New OB)  GENERAL APPEARANCE: alert, well appearing   ASSESSMENT Normal pregnancy  PLAN Prenatal care @ [facility] Pnl/HIV will be done at NOB visit PHQ-9=2 BP Cuff ordered Pregnancy Verification letter done

## 2020-05-24 NOTE — Progress Notes (Signed)
Patient was assessed and managed by nursing staff during this encounter. I have reviewed the chart and agree with the documentation and plan. I have also made any necessary editorial changes.  Fatima Blank, CNM 05/24/2020 12:27 PM

## 2020-06-27 ENCOUNTER — Inpatient Hospital Stay (HOSPITAL_COMMUNITY)
Admission: AD | Admit: 2020-06-27 | Discharge: 2020-06-27 | Disposition: A | Discharge status: Home / Self Care | Payer: Medicaid Other | Attending: Obstetrics and Gynecology | Admitting: Obstetrics and Gynecology

## 2020-06-27 ENCOUNTER — Encounter (HOSPITAL_COMMUNITY): Payer: Self-pay | Admitting: Obstetrics and Gynecology

## 2020-06-27 ENCOUNTER — Other Ambulatory Visit: Payer: Self-pay

## 2020-06-27 DIAGNOSIS — O26891 Other specified pregnancy related conditions, first trimester: Secondary | ICD-10-CM | POA: Insufficient documentation

## 2020-06-27 DIAGNOSIS — Z3A11 11 weeks gestation of pregnancy: Secondary | ICD-10-CM | POA: Insufficient documentation

## 2020-06-27 DIAGNOSIS — R112 Nausea with vomiting, unspecified: Secondary | ICD-10-CM | POA: Diagnosis not present

## 2020-06-27 DIAGNOSIS — O219 Vomiting of pregnancy, unspecified: Secondary | ICD-10-CM

## 2020-06-27 DIAGNOSIS — O210 Mild hyperemesis gravidarum: Secondary | ICD-10-CM | POA: Diagnosis not present

## 2020-06-27 LAB — URINALYSIS, ROUTINE W REFLEX MICROSCOPIC
Bilirubin Urine: NEGATIVE
Glucose, UA: NEGATIVE mg/dL
Hgb urine dipstick: NEGATIVE
Ketones, ur: NEGATIVE mg/dL
Nitrite: NEGATIVE
Protein, ur: 30 mg/dL — AB
Specific Gravity, Urine: 1.032 — ABNORMAL HIGH (ref 1.005–1.030)
pH: 5 (ref 5.0–8.0)

## 2020-06-27 LAB — BASIC METABOLIC PANEL
Anion gap: 9 (ref 5–15)
BUN: 5 mg/dL — ABNORMAL LOW (ref 6–20)
CO2: 22 mmol/L (ref 22–32)
Calcium: 9.2 mg/dL (ref 8.9–10.3)
Chloride: 105 mmol/L (ref 98–111)
Creatinine, Ser: 0.75 mg/dL (ref 0.44–1.00)
GFR, Estimated: >60 mL/min (ref >60)
Glucose, Bld: 100 mg/dL — ABNORMAL HIGH (ref 70–99)
Potassium: 3.3 mmol/L — ABNORMAL LOW (ref 3.5–5.1)
Sodium: 136 mmol/L (ref 135–145)

## 2020-06-27 LAB — CBC
HCT: 34.5 % — ABNORMAL LOW (ref 36.0–46.0)
Hemoglobin: 10.8 g/dL — ABNORMAL LOW (ref 12.0–15.0)
MCH: 24.3 pg — ABNORMAL LOW (ref 26.0–34.0)
MCHC: 31.3 g/dL (ref 30.0–36.0)
MCV: 77.7 fL — ABNORMAL LOW (ref 80.0–100.0)
Platelets: 344 10*3/uL (ref 150–400)
RBC: 4.44 MIL/uL (ref 3.87–5.11)
RDW: 18 % — ABNORMAL HIGH (ref 11.5–15.5)
WBC: 6.2 10*3/uL (ref 4.0–10.5)
nRBC: 0 % (ref 0.0–0.2)

## 2020-06-27 MED ORDER — LACTATED RINGERS IV BOLUS
1000.0000 mL | Freq: Once | INTRAVENOUS | Status: AC
  Administered 2020-06-27: 1000 mL via INTRAVENOUS

## 2020-06-27 MED ORDER — PROMETHAZINE HCL 25 MG/ML IJ SOLN
25.0000 mg | Freq: Once | INTRAMUSCULAR | Status: AC
  Administered 2020-06-27: 25 mg via INTRAVENOUS
  Filled 2020-06-27: qty 1

## 2020-06-27 MED ORDER — DOXYLAMINE SUCCINATE (SLEEP) 25 MG PO TABS: 25.0000 mg | ORAL_TABLET | Freq: Three times a day (TID) | ORAL | 0 refills | Status: DC | PRN

## 2020-06-27 MED ORDER — PROMETHAZINE HCL 12.5 MG PO TABS: 12.5000 mg | ORAL_TABLET | Freq: Four times a day (QID) | ORAL | 0 refills | Status: DC | PRN

## 2020-06-27 MED ORDER — PYRIDOXINE HCL 25 MG PO TABS: 25.0000 mg | ORAL_TABLET | Freq: Three times a day (TID) | ORAL | 0 refills | Status: DC

## 2020-06-27 NOTE — MAU Note (Signed)
Pt reports to mau with c/o seeing blood when she vomits for the past 2 days.  Pt reports she vomits most days but started seeing blood yesterday. States she has only vomited once today.  Denies abd pain or vag bleeding.

## 2020-06-27 NOTE — MAU Provider Note (Signed)
History     CSN: [id number]  Arrival date and time: 06/27/20 0846   Event Date/Time   First Provider Initiated Contact with Patient 06/27/20 1103      Chief Complaint  Patient presents with  . Emesis   HPI Erin Jacobs is a 24 y.o. [id number] at [redacted]w[redacted]d who presents to MAU with chief complaint of nausea, vomiting and blood-tinged saliva. Patient states she vomits throughout the day. She is unable to estimate the number of episodes. She does not have antiemetics or other treatments for these complaints.  Patient denies abdominal pain, vaginal bleeding, dysuria, constipation, fever or recent illness.  OB History    Gravida  5   Para  3   Term  3   Preterm  0   AB  1   Living  3     SAB  1   IAB  0   Ectopic  0   Multiple  0   Live Births  3           Past Medical History:  Diagnosis Date  . Anemia   . Anxiety   . Medical history non-contributory     Past Surgical History:  Procedure Laterality Date  . leg suregry- childhood      Family History  Problem Relation Age of Onset  . Diabetes Maternal Aunt   . Cancer Maternal Aunt   . Diabetes Maternal Uncle   . Cancer Maternal Uncle   . Hypertension Maternal Uncle   . Stroke Maternal Uncle     Social History   Tobacco Use  . Smoking status: Never Smoker  . Smokeless tobacco: Never Used  Vaping Use  . Vaping Use: Former  Substance Use Topics  . Alcohol use: No  . Drug use: Not Currently    Allergies: No Known Allergies  No medications prior to admission.    Review of Systems  Gastrointestinal: Positive for nausea and vomiting.  All other systems reviewed and are negative.  Physical Exam   Blood pressure (!) 107/55, pulse 83, temperature 98.7 F (37.1 C), temperature source Oral, resp. rate 17, height 5\' 7"  (1.702 m), weight 90.4 kg, last menstrual period 04/06/2020, SpO2 100 %, unknown if currently breastfeeding.  Physical Exam Vitals and nursing note reviewed. Exam conducted  with a chaperone present.  Constitutional:      Appearance: Normal appearance. She is not ill-appearing.  HENT:     Mouth/Throat:     Mouth: Mucous membranes are moist. No oral lesions.     Pharynx: Oropharynx is clear. Uvula midline.  Cardiovascular:     Rate and Rhythm: Normal rate.     Pulses: Normal pulses.     Heart sounds: Normal heart sounds.  Pulmonary:     Effort: Pulmonary effort is normal.     Breath sounds: Normal breath sounds.  Abdominal:     General: Abdomen is flat. Bowel sounds are normal.     Tenderness: There is no right CVA tenderness or left CVA tenderness.  Skin:    General: Skin is warm and dry.     Capillary Refill: Capillary refill takes less than 2 seconds.  Neurological:     General: No focal deficit present.     Mental Status: She is alert.  Psychiatric:        Mood and Affect: Mood normal.        Behavior: Behavior normal.        Thought Content: Thought content normal.  Judgment: Judgment normal.     MAU Course  Procedures  --Abnormal UA, Squam epi noted suggesting contamination. Not enough sample to add on culture. Patient has New OB tomorrow and can provide additional sample  Patient Vitals for the past 24 hrs:  BP Temp Temp src Pulse Resp SpO2 Height Weight  06/27/20 1126 (!) 107/55 98.7 F (37.1 C) Oral 83 17 100 % -- --  06/27/20 0917 -- -- -- -- -- 100 % -- --  06/27/20 0909 (!) 113/58 98.1 F (36.7 C) Oral 93 18 99 % -- --  06/27/20 0856 119/66 98.5 F (36.9 C) Oral (!) 104 15 100 % 5\' 7"  (1.702 m) 90.4 kg   Results for orders placed or performed during the hospital encounter of 06/27/20 (from the past 24 hour(s))  CBC     Status: Abnormal   Collection Time: 06/27/20  9:54 AM  Result Value Ref Range   WBC 6.2 4.0 - 10.5 K/uL   RBC 4.44 3.87 - 5.11 MIL/uL   Hemoglobin 10.8 (L) 12.0 - 15.0 g/dL   HCT 34.5 (L) 36.0 - 46.0 %   MCV 77.7 (L) 80.0 - 100.0 fL   MCH 24.3 (L) 26.0 - 34.0 pg   MCHC 31.3 30.0 - 36.0 g/dL   RDW  18.0 (H) 11.5 - 15.5 %   Platelets 344 150 - 400 K/uL   nRBC 0.0 0.0 - 0.2 %  Basic metabolic panel     Status: Abnormal   Collection Time: 06/27/20  9:54 AM  Result Value Ref Range   Sodium 136 135 - 145 mmol/L   Potassium 3.3 (L) 3.5 - 5.1 mmol/L   Chloride 105 98 - 111 mmol/L   CO2 22 22 - 32 mmol/L   Glucose, Bld 100 (H) 70 - 99 mg/dL   BUN 5 (L) 6 - 20 mg/dL   Creatinine, Ser 0.75 0.44 - 1.00 mg/dL   Calcium 9.2 8.9 - 10.3 mg/dL   GFR, Estimated >60 >60 mL/min   Anion gap 9 5 - 15  Urinalysis, Routine w reflex microscopic Urine, Clean Catch     Status: Abnormal   Collection Time: 06/27/20 10:34 AM  Result Value Ref Range   Color, Urine AMBER (A) YELLOW   APPearance HAZY (A) CLEAR   Specific Gravity, Urine 1.032 (H) 1.005 - 1.030   pH 5.0 5.0 - 8.0   Glucose, UA NEGATIVE NEGATIVE mg/dL   Hgb urine dipstick NEGATIVE NEGATIVE   Bilirubin Urine NEGATIVE NEGATIVE   Ketones, ur NEGATIVE NEGATIVE mg/dL   Protein, ur 30 (A) NEGATIVE mg/dL   Nitrite NEGATIVE NEGATIVE   Leukocytes,Ua MODERATE (A) NEGATIVE   RBC / HPF 0-5 0 - 5 RBC/hpf   WBC, UA 11-20 0 - 5 WBC/hpf   Bacteria, UA RARE (A) NONE SEEN   Squamous Epithelial / LPF 6-10 0 - 5   Mucus PRESENT    Meds ordered this encounter  Medications  . lactated ringers bolus 1,000 mL  . promethazine (PHENERGAN) injection 25 mg  . pyridOXINE (VITAMIN B-6) 25 MG tablet    Sig: Take 1 tablet (25 mg total) by mouth every 8 (eight) hours.    Dispense:  30 tablet    Refill:  0    Order Specific Question:   Supervising Provider    Answer:   Aletha Halim [id number]  . doxylamine, Sleep, (UNISOM) 25 MG tablet    Sig: Take 1 tablet (25 mg total) by mouth every 8 (eight) hours as needed.  Dispense:  30 tablet    Refill:  0    Order Specific Question:   Supervising Provider    Answer:   Aletha Halim [id number]  . promethazine (PHENERGAN) 12.5 MG tablet    Sig: Take 1 tablet (12.5 mg total) by mouth every 6 (six) hours as needed  for nausea or vomiting.    Dispense:  30 tablet    Refill:  0    Order Specific Question:   Supervising Provider    Answer:   Aletha Halim [[id number]]    Assessment and Plan  --24 y.o. [id number] at [redacted]w[redacted]d  --[place] [address] by Doppler --Nausea and vomiting in first trimester --No other acute concerns --Tolerating PO prior to discharge --Discharge home in stable condition  F/U: --Patient has New OB at [facility] tomorrow 06/28/2020  Darlina Rumpf, [place] 06/27/2020, 4:37 PM

## 2020-06-27 NOTE — Discharge Instructions (Signed)
Follow these instructions at home: Eating and drinking   Avoid the following: ? Drinking fluids with meals. Try not to drink anything during the 30 minutes before and after your meals. ? Drinking more than 1 cup of fluid at a time. ? Eating foods that trigger your symptoms. These may include spicy foods, coffee, high-fat foods, very sweet foods, and acidic foods. ? Skipping meals. Nausea can be more intense on an empty stomach. If you cannot tolerate food, do not force it. Try sucking on ice chips or other frozen items and make up for missed calories later. ? Lying down within 2 hours after eating. ? Being exposed to environmental triggers. These may include food smells, smoky rooms, closed spaces, rooms with strong smells, warm or humid places, overly loud and noisy rooms, and rooms with motion or flickering lights. Try eating meals in a well-ventilated area that is free of strong smells. ? Quick and sudden changes in your movement. ? Taking iron pills and multivitamins that contain iron. If you take prescription iron pills, do not stop taking them unless your health care provider approves. ? Preparing food. The smell of food can spoil your appetite or trigger nausea.  To help relieve your symptoms: ? Listen to your body. Everyone is different and has different preferences. Find what works best for you. ? Eat and drink slowly. ? Eat 5-6 small meals daily instead of 3 large meals. Eating small meals and snacks can help you avoid an empty stomach. ? In the morning, before getting out of bed, eat a couple of crackers to avoid moving around on an empty stomach. ? Try eating starchy foods as these are usually tolerated well. Examples include cereal, toast, bread, potatoes, pasta, rice, and pretzels. ? Include at least 1 serving of protein with your meals and snacks. Protein options include lean meats, poultry, seafood, beans, nuts, nut butters, eggs, cheese, and yogurt. ? Try eating a protein-rich  snack before bed. Examples of a protein-rick snack include cheese and crackers or a peanut butter sandwich made with 1 slice of whole-wheat bread and 1 tsp (5 g) of peanut butter. ? Eat or suck on things that have ginger in them. It may help relieve nausea. Add  tsp ground ginger to hot tea or choose ginger tea. ? Try drinking 100% fruit juice or an electrolyte drink. An electrolyte drink contains sodium, potassium, and chloride. ? Drink fluids that are cold, clear, and carbonated or sour. Examples include lemonade, ginger ale, lemon-lime soda, ice water, and sparkling water. ? Brush your teeth or use a mouth rinse after meals. ? Talk with your health care provider about starting a supplement of vitamin B6. General instructions  Take over-the-counter and prescription medicines only as told by your health care provider.  Follow instructions from your health care provider about eating or drinking restrictions.  Continue to take your prenatal vitamins as told by your health care provider. If you are having trouble taking your prenatal vitamins, talk with your health care provider about different options.  Keep all follow-up and pre-birth (prenatal) visits as told by your health care provider. This is important. Contact a health care provider if:  You have pain in your abdomen.  You have a severe headache.  You have vision problems.  You are losing weight.  You feel weak or dizzy. Get help right away if:  You cannot drink fluids without vomiting.  You vomit blood.  You have constant nausea and vomiting.  You are  very weak.  You faint.  You have a fever and your symptoms suddenly get worse. Summary  Making some changes to your eating habits may help relieve nausea and vomiting.  This condition may be managed with medicine.  If medicines do not help relieve nausea and vomiting, you may need to receive fluids through an IV at the hospital. This information is not intended to  replace advice given to you by your health care provider. Make sure you discuss any questions you have with your health care provider. Document Revised: 07/23/2017 Document Reviewed: 03/01/2016 Elsevier Patient Education  [place].

## 2020-06-28 ENCOUNTER — Other Ambulatory Visit (HOSPITAL_COMMUNITY)
Admission: RE | Admit: 2020-06-28 | Discharge: 2020-06-28 | Disposition: A | Discharge status: Home / Self Care | Payer: Medicaid Other | Source: Ambulatory Visit | Attending: Advanced Practice Midwife | Admitting: Advanced Practice Midwife

## 2020-06-28 ENCOUNTER — Ambulatory Visit (INDEPENDENT_AMBULATORY_CARE_PROVIDER_SITE_OTHER): Payer: Medicaid Other | Admitting: Advanced Practice Midwife

## 2020-06-28 ENCOUNTER — Encounter: Payer: Self-pay | Admitting: Advanced Practice Midwife

## 2020-06-28 VITALS — BP 111/75 | HR 86 | Wt 201.0 lb

## 2020-06-28 DIAGNOSIS — Z3A11 11 weeks gestation of pregnancy: Secondary | ICD-10-CM

## 2020-06-28 DIAGNOSIS — Z3687 Encounter for antenatal screening for uncertain dates: Secondary | ICD-10-CM

## 2020-06-28 DIAGNOSIS — Z3401 Encounter for supervision of normal first pregnancy, first trimester: Secondary | ICD-10-CM | POA: Diagnosis not present

## 2020-06-28 DIAGNOSIS — Z3481 Encounter for supervision of other normal pregnancy, first trimester: Secondary | ICD-10-CM | POA: Diagnosis not present

## 2020-06-28 DIAGNOSIS — O219 Vomiting of pregnancy, unspecified: Secondary | ICD-10-CM

## 2020-06-28 DIAGNOSIS — Z3480 Encounter for supervision of other normal pregnancy, unspecified trimester: Secondary | ICD-10-CM

## 2020-06-28 DIAGNOSIS — O2341 Unspecified infection of urinary tract in pregnancy, first trimester: Secondary | ICD-10-CM

## 2020-06-28 NOTE — Progress Notes (Signed)
NOB  Intake already completed on 05/24/20  Planned Pregnancy: No  Genetic Screening: Desires and wants to know Gender   Last pap: 01/31/52018  CC:  Nausea and vomiting had MAU visit yesterday given Rx. Has not picked up Rx yet.  Had UA done yesterday.

## 2020-06-28 NOTE — Patient Instructions (Signed)
Morning Sickness  Morning sickness is when a woman feels nauseous during pregnancy. This nauseous feeling may or may not come with vomiting. It often occurs in the morning, but it can be a problem at any time of day. Morning sickness is most common during the first trimester. In some cases, it may continue throughout pregnancy. Although morning sickness is unpleasant, it is usually harmless unless the woman develops severe and continual vomiting (hyperemesis gravidarum), a condition that requires more intense treatment. What are the causes? The exact cause of this condition is not known, but it seems to be related to normal hormonal changes that occur in pregnancy. What increases the risk? You are more likely to develop this condition if:  You experienced nausea or vomiting before your pregnancy.  You had morning sickness during a previous pregnancy.  You are pregnant with more than one baby, such as twins. What are the signs or symptoms? Symptoms of this condition include:  Nausea.  Vomiting. How is this diagnosed? This condition is usually diagnosed based on your signs and symptoms. How is this treated? In many cases, treatment is not needed for this condition. Making some changes to what you eat may help to control symptoms. Your health care provider may also prescribe or recommend:  Vitamin B6 supplements.  Anti-nausea medicines.  Ginger. Follow these instructions at home: Medicines  Take over-the-counter and prescription medicines only as told by your health care provider. Do not use any prescription, over-the-counter, or herbal medicines for morning sickness without first talking with your health care provider.  Taking multivitamins before getting pregnant can prevent or decrease the severity of morning sickness in most women. Eating and drinking  Eat a piece of dry toast or crackers before getting out of bed in the morning.  Eat 5 or 6 small meals a day.  Eat dry and  bland foods, such as rice or a baked potato. Foods that are high in carbohydrates are often helpful.  Avoid greasy, fatty, and spicy foods.  Have someone cook for you if the smell of any food causes nausea and vomiting.  If you feel nauseous after taking prenatal vitamins, take the vitamins at night or with a snack.  Snack on protein foods between meals if you are hungry. Nuts, yogurt, and cheese are good options.  Drink fluids throughout the day.  Try ginger ale made with real ginger, ginger tea made from fresh grated ginger, or ginger candies. General instructions  Do not use any products that contain nicotine or tobacco, such as cigarettes and e-cigarettes. If you need help quitting, ask your health care provider.  Get an air purifier to keep the air in your house free of odors.  Get plenty of fresh air.  Try to avoid odors that trigger your nausea.  Consider trying these methods to help relieve symptoms: ? Wearing an acupressure wristband. These wristbands are often worn for seasickness. ? Acupuncture. Contact a health care provider if:  Your home remedies are not working and you need medicine.  You feel dizzy or light-headed.  You are losing weight. Get help right away if:  You have persistent and uncontrolled nausea and vomiting.  You faint.  You have severe pain in your abdomen. Summary  Morning sickness is when a woman feels nauseous during pregnancy. This nauseous feeling may or may not come with vomiting.  Morning sickness is most common during the first trimester.  It often occurs in the morning, but it can be a problem at  any time of day.  In many cases, treatment is not needed for this condition. Making some changes to what you eat may help to control symptoms. This information is not intended to replace advice given to you by your health care provider. Make sure you discuss any questions you have with your health care provider. Document Revised:  06/15/2017 Document Reviewed: 08/05/2016 Elsevier Patient Education  [place].  Safe Medications in Pregnancy   Acne: Benzoyl Peroxide Salicylic Acid  Backache/Headache: Tylenol: 2 regular strength every 4 hours OR              2 Extra strength every 6 hours  Colds/Coughs/Allergies: Benadryl (alcohol free) 25 mg every 6 hours as needed Breath right strips Claritin Cepacol throat lozenges Chloraseptic throat spray Cold-Eeze- up to three times per day Cough drops, alcohol free Flonase (by prescription only) Guaifenesin Mucinex Robitussin DM (plain only, alcohol free) Saline nasal spray/drops Sudafed (pseudoephedrine) & Actifed ** use only after [redacted] weeks gestation and if you do not have high blood pressure Tylenol Vicks Vaporub Zinc lozenges Zyrtec   Constipation: Colace Ducolax suppositories Fleet enema Glycerin suppositories Metamucil Milk of magnesia Miralax Senokot Smooth move tea  Diarrhea: Kaopectate Imodium A-D  *NO pepto Bismol  Hemorrhoids: Anusol Anusol HC Preparation H Tucks  Indigestion: Tums Maalox Mylanta Zantac  Pepcid  Insomnia: Benadryl (alcohol free) 25mg  every 6 hours as needed Tylenol PM Unisom, no Gelcaps  Leg Cramps: Tums MagGel  Nausea/Vomiting:  Bonine Dramamine Emetrol Ginger extract Sea bands Meclizine  Nausea medication to take during pregnancy:  Unisom (doxylamine succinate 25 mg tablets) Take one tablet daily at bedtime. If symptoms are not adequately controlled, the dose can be increased to a maximum recommended dose of two tablets daily (1/2 tablet in the morning, 1/2 tablet mid-afternoon and one at bedtime). Vitamin B6 100mg  tablets. Take one tablet twice a day (up to 200 mg per day).  Skin Rashes: Aveeno products Benadryl cream or 25mg  every 6 hours as needed Calamine Lotion 1% cortisone cream  Yeast infection: Gyne-lotrimin 7 Monistat 7   **If taking multiple medications, please check  labels to avoid duplicating the same active ingredients **take medication as directed on the label ** Do not exceed 4000 mg of tylenol in 24 hours **Do not take medications that contain aspirin or ibuprofen

## 2020-06-28 NOTE — Progress Notes (Signed)
Subjective:   Erin Jacobs is a 24 y.o. [phone] at 18w6dby sure LMP, but last period was lighter than usual, being seen today for her first obstetrical visit.  Her obstetrical history is significant for NSVD x 3 and has Maternal varicella, non-immune; Familial hypospadias of penis; and Supervision of other normal pregnancy, antepartum on their problem list.. Patient does intend to breast feed. Pregnancy history fully reviewed.  Patient reports nausea and vomiting.  HISTORY: OB History  Gravida Para Term Preterm AB Living  5 3 3  0 1 3  SAB IAB Ectopic Multiple Live Births  1 0 0 0 3    # Outcome Date GA Lbr Len/2nd Weight Sex Delivery Anes PTL Lv  5 Current           4 Term 02/21/18 [id number] 6 lb 5.6 oz (2.88 kg) M Vag-Spont None  LIV     Name: Blanck,BOY [place]     Apgar1: 7  Apgar5: 8  3 Term 02/23/17 [id number]:36 / 00:02 6 lb 15.3 oz (3.155 kg) M Vag-Spont Local  LIV     Name: Cryder,BOY Lucie     Apgar1: 9  Apgar5: 9  2 Term 03/10/14 40w48w6d18 / 00:34 6 lb 6.8 oz (2.914 kg) M Vag-Spont EPI  LIV     Birth Comments: WNL, small abraision on roof of mouth     Name: Croft,BOY Jaeden     Apgar1: 8  Apgar5: 9  1 SAB 2014 [id number]  DEC   Past Medical History:  Diagnosis Date  . Anemia   . Anxiety   . Medical history non-contributory    Past Surgical History:  Procedure Laterality Date  . leg suregry- childhood     Family History  Problem Relation Age of Onset  . Diabetes Maternal Aunt   . Cancer Maternal Aunt   . Diabetes Maternal Uncle   . Cancer Maternal Uncle   . Hypertension Maternal Uncle   . Stroke Maternal Uncle    Social History   Tobacco Use  . Smoking status: Never Smoker  . Smokeless tobacco: Never Used  Vaping Use  . Vaping Use: Former  Substance Use Topics  . Alcohol use: No  . Drug use: Not Currently   No Known Allergies Current Outpatient Medications on File Prior to Visit  Medication Sig Dispense Refill  . Prenatal MV & Min  w/FA-DHA (PRENATAL GUMMIES PO) Take 2 each by mouth.    . Blood Pressure Monitoring (BLOOD PRESSURE KIT) DEVI 1 kit by Does not apply route once a week. Check Blood Pressure regularly and record readings into the Bab[employer] App.  Large Cuff.  DX O90.0 1 each 0  . doxylamine, Sleep, (UNISOM) 25 MG tablet Take 1 tablet (25 mg total) by mouth every 8 (eight) hours as needed. 30 tablet 0  . promethazine (PHENERGAN) 12.5 MG tablet Take 1 tablet (12.5 mg total) by mouth every 6 (six) hours as needed for nausea or vomiting. 30 tablet 0  . pyridOXINE (VITAMIN B-6) 25 MG tablet Take 1 tablet (25 mg total) by mouth every 8 (eight) hours. 30 tablet 0  . [DISCONTINUED] ferrous sulfate (FERROUSUL) 325 (65 FE) MG tablet Take 1 tablet (325 mg total) by mouth 2 (two) times daily. 60 tablet 1   No current facility-administered medications on file prior to visit.     Indications for ASA therapy (per uptodate) One of the following: Previous pregnancy with preeclampsia, especially  early onset and with an adverse outcome No Multifetal gestation No Chronic hypertension No Type 1 or 2 diabetes mellitus No Chronic kidney disease No Autoimmune disease (antiphospholipid syndrome, systemic lupus erythematosus) No   Two or more of the following: Nulliparity No Obesity (body mass index >30 kg/m2) No Family history of preeclampsia in mother or sister No Age ?35 years No Sociodemographic characteristics (African American race, low socioeconomic level) Yes Personal risk factors (eg, previous pregnancy with low birth weight or small for gestational age infant, previous adverse pregnancy outcome [eg, stillbirth], interval >10 years between pregnancies) No   Indications for early 1 hour GTT (per uptodate)  BMI >25 (>23 in Asian women) AND one of the following  Gestational diabetes mellitus in a previous pregnancy No Glycated hemoglobin ?5.7 percent (39 mmol/mol), impaired glucose tolerance, or impaired fasting glucose  on previous testing No First-degree relative with diabetes No High-risk race/ethnicity (eg, African American, Latino, Native American, [place] American, Pacific Islander) Yes History of cardiovascular disease No Hypertension or on therapy for hypertension No High-density lipoprotein cholesterol level <35 mg/dL (0.90 mmol/L) and/or a triglyceride level >250 mg/dL (2.82 mmol/L) No Polycystic ovary syndrome No Physical inactivity No Other clinical condition associated with insulin resistance (eg, severe obesity, acanthosis nigricans) No Previous birth of an infant weighing ?4000 g No Previous stillbirth of unknown cause No Exam   Vitals:   06/28/20 1023  BP: 111/75  Pulse: 86  Weight: 201 lb (91.2 kg)      Uterus:     Pelvic Exam: Perineum: no hemorrhoids, normal perineum   Vulva: normal external genitalia, no lesions   Vagina:  normal mucosa, normal discharge   Cervix: no lesions and normal, pap smear done.    Adnexa: normal adnexa and no mass, fullness, tenderness   Bony Pelvis: average  System: General: well-developed, well-nourished female in no acute distress   Breast:  normal appearance, no masses or tenderness   Skin: normal coloration and turgor, no rashes   Neurologic: oriented, normal, negative, normal mood   Extremities: normal strength, tone, and muscle mass, ROM of all joints is normal   HEENT PERRLA, extraocular movement intact and sclera clear, anicteric   Mouth/Teeth mucous membranes moist, pharynx normal without lesions and dental hygiene good   Neck supple and no masses   Cardiovascular: regular rate and rhythm   Respiratory:  no respiratory distress, normal breath sounds   Abdomen: soft, non-tender; bowel sounds normal; no masses,  no organomegaly     Assessment:   Pregnancy: [id number] Patient Active Problem List   Diagnosis Date Noted  . Supervision of other normal pregnancy, antepartum 05/24/2020  . Maternal varicella, non-immune 10/23/2017  . Familial  hypospadias of penis 10/23/2017     Plan:  1. [redacted] weeks gestation of pregnancy   2. Encounter for supervision of normal first pregnancy in first trimester --Anticipatory guidance about next visits/weeks of pregnancy given. -- - CBC/D/Plt+RPR+Rh+ABO+Rub Ab... - Culture, OB Urine - Genetic Screening - Cytology - PAP( [place]) - Cervicovaginal ancillary only( [place])  3. Nausea and vomiting during pregnancy prior to [redacted] weeks gestation --Rx for Phenergan and Unisom/B6 written yesterday in MAU --Pt to start with medications today, sip fluids to remain hydrated.  Initial labs drawn. Continue prenatal vitamins. Discussed and offered genetic screening options, including Quad screen/AFP, NIPS testing, and option to decline testing. Benefits/risks/alternatives reviewed. Pt aware that anatomy US is form of genetic screening with lower accuracy in detecting trisomies than blood work.  Pt chooses  genetic screening today. NIPS: ordered. Ultrasound discussed; fetal anatomic survey: requested. Problem list reviewed and updated. The nature of [place] with multiple MDs and other Advanced Practice Providers was explained to patient; also emphasized that residents, students are part of our team. Routine obstetric precautions reviewed. Return in about 4 weeks (around 07/26/2020).   Fatima Blank, CNM 06/28/20 11:16 AM

## 2020-06-29 LAB — CBC/D/PLT+RPR+RH+ABO+RUB AB...
Antibody Screen: NEGATIVE
Basophils Absolute: 0 10*3/uL (ref 0.0–0.2)
Basos: 0 %
EOS (ABSOLUTE): 0.1 10*3/uL (ref 0.0–0.4)
Eos: 2 %
HCV Ab: <0.1 s/co ratio (ref 0.0–0.9)
HIV Screen 4th Generation wRfx: NONREACTIVE
Hematocrit: 30.8 % — ABNORMAL LOW (ref 34.0–46.6)
Hemoglobin: 10 g/dL — ABNORMAL LOW (ref 11.1–15.9)
Hepatitis B Surface Ag: NEGATIVE
Immature Grans (Abs): 0 10*3/uL (ref 0.0–0.1)
Immature Granulocytes: 0 %
Lymphocytes Absolute: 2.5 10*3/uL (ref 0.7–3.1)
Lymphs: 40 %
MCH: 24.7 pg — ABNORMAL LOW (ref 26.6–33.0)
MCHC: 32.5 g/dL (ref 31.5–35.7)
MCV: 76 fL — ABNORMAL LOW (ref 79–97)
Monocytes Absolute: 0.6 10*3/uL (ref 0.1–0.9)
Monocytes: 10 %
Neutrophils Absolute: 2.9 10*3/uL (ref 1.4–7.0)
Neutrophils: 48 %
Platelets: 363 10*3/uL (ref 150–450)
RBC: 4.05 x10E6/uL (ref 3.77–5.28)
RDW: 17.8 % — ABNORMAL HIGH (ref 11.7–15.4)
RPR Ser Ql: NONREACTIVE
Rh Factor: POSITIVE
Rubella Antibodies, IGG: 16.6 index (ref >0.99)
WBC: 6.1 10*3/uL (ref 3.4–10.8)

## 2020-06-29 LAB — CERVICOVAGINAL ANCILLARY ONLY
Bacterial Vaginitis (gardnerella): POSITIVE — AB
Candida Glabrata: NEGATIVE
Candida Vaginitis: NEGATIVE
Chlamydia: NEGATIVE
Comment: NEGATIVE
Comment: NEGATIVE
Comment: NEGATIVE
Comment: NEGATIVE
Comment: NEGATIVE
Comment: NORMAL
Neisseria Gonorrhea: NEGATIVE
Trichomonas: NEGATIVE

## 2020-06-29 LAB — HCV INTERPRETATION

## 2020-06-30 LAB — CYTOLOGY - PAP: Diagnosis: NEGATIVE

## 2020-07-02 LAB — URINE CULTURE, OB REFLEX

## 2020-07-02 LAB — CULTURE, OB URINE

## 2020-07-02 MED ORDER — CEFADROXIL 500 MG PO CAPS: 500.0000 mg | ORAL_CAPSULE | Freq: Two times a day (BID) | ORAL | 0 refills | Status: AC

## 2020-07-02 NOTE — Addendum Note (Signed)
Addended by: Fatima Blank A on: 07/02/2020 07:53 PM   Modules accepted: Orders

## 2020-07-05 ENCOUNTER — Encounter: Payer: Self-pay | Admitting: Advanced Practice Midwife

## 2020-07-12 ENCOUNTER — Telehealth: Payer: Self-pay | Admitting: Medical

## 2020-07-12 ENCOUNTER — Other Ambulatory Visit: Payer: Self-pay | Admitting: Advanced Practice Midwife

## 2020-07-12 ENCOUNTER — Ambulatory Visit
Admission: RE | Admit: 2020-07-12 | Discharge: 2020-07-12 | Disposition: A | Discharge status: Home / Self Care | Payer: Medicaid Other | Source: Ambulatory Visit | Attending: Advanced Practice Midwife | Admitting: Advanced Practice Midwife

## 2020-07-12 ENCOUNTER — Other Ambulatory Visit: Payer: Self-pay

## 2020-07-12 DIAGNOSIS — Z3687 Encounter for antenatal screening for uncertain dates: Secondary | ICD-10-CM

## 2020-07-12 NOTE — Telephone Encounter (Signed)
Attempted to contact patient, Erin Jacobs, with [place] results from this morning. The patient does not have a VM set up. Will call again later.   Vonzella Nipple, PA-C 07/12/2020 11:49 AM

## 2020-07-12 NOTE — Telephone Encounter (Signed)
Attempted to contact Erin Jacobs by phone with [place] results from earlier today. Unable to reach, no VM set up. Patient does not have active MyChart account.   Vonzella Nipple, PA-C 07/12/2020 4:12 PM

## 2020-07-17 NOTE — L&D Delivery Note (Signed)
OB/GYN Faculty Practice Delivery Note  Erin Jacobs is a 25 y.o. [id number] s/p SVD at [redacted]w[redacted]d. She was admitted for IOL for PD/DFM.   ROM: 4h 2m with clear fluid GBS Status:  Negative/-- (06/02 0244) Maximum Maternal Temperature: 98.7  Labor Progress: Initial SVE: 2cm. She received pitocin and then was AROM'd. She then progressed to complete.   Delivery Date/Time: 01:29 on 7/5 Delivery: Called to room and patient was complete and pushing. Head delivered LOA. No nuchal cord present. Shoulder and body delivered in usual fashion. Infant with spontaneous cry, placed on mother's abdomen, dried and stimulated. Cord clamped x 2 after 1-minute delay, and cut by FOB. Cord blood drawn. Pitocin started after delivery of infant. Placenta delivered spontaneously with gentle cord traction. Labia, perineum, vagina, and cervix inspected inspected with no lacerations. Large clots expressed with fundal rub, TXA and methergine given. Uterus still boggy with large clots expressed. Bladder drained and manual sweep performed, notable for suspected membranes. Bleeding improved. Ancef administered s/p sweep.   Baby Weight: pending  Placenta: Sent to L&D Complications: suspected retained membranes Lacerations: none EBL: 391 mL Analgesia: IV fentanyl for manual sweep    Infant:  APGAR (1 MIN):   APGAR (5 MINS):   APGAR (10 MINS):     Sharene Skeans, MD [facility] Family Medicine Fellow, [facility] for [facility], [place] [facility] 01/18/2021, 2:17 AM

## 2020-07-26 ENCOUNTER — Ambulatory Visit (INDEPENDENT_AMBULATORY_CARE_PROVIDER_SITE_OTHER): Payer: Medicaid Other | Admitting: Advanced Practice Midwife

## 2020-07-26 ENCOUNTER — Encounter: Payer: Self-pay | Admitting: General Practice

## 2020-07-26 ENCOUNTER — Other Ambulatory Visit: Payer: Self-pay

## 2020-07-26 VITALS — BP 104/67 | HR 96 | Wt 202.0 lb

## 2020-07-26 DIAGNOSIS — Z3A19 19 weeks gestation of pregnancy: Secondary | ICD-10-CM

## 2020-07-26 DIAGNOSIS — Z3480 Encounter for supervision of other normal pregnancy, unspecified trimester: Secondary | ICD-10-CM

## 2020-07-26 DIAGNOSIS — Z3A15 15 weeks gestation of pregnancy: Secondary | ICD-10-CM

## 2020-07-26 NOTE — Progress Notes (Signed)
ROB/AFP

## 2020-07-26 NOTE — Progress Notes (Signed)
   PRENATAL VISIT NOTE  Subjective:  Erin Jacobs is a 25 y.o. [phone] at [redacted]w[redacted]d being seen today for ongoing prenatal care.  She is currently monitored for the following issues for this low-risk pregnancy and has Maternal varicella, non-immune; Familial hypospadias of penis; and Supervision of other normal pregnancy, antepartum on their problem list.  Patient reports no complaints.  Contractions: Not present. Vag. Bleeding: None.   . Denies leaking of fluid.   The following portions of the patient's history were reviewed and updated as appropriate: allergies, current medications, past family history, past medical history, past social history, past surgical history and problem list.   Objective:   Vitals:   07/26/20 1054  BP: 104/67  Pulse: 96  Weight: 202 lb (91.6 kg)    Fetal Status: Fetal Heart Rate (bpm): 147         General:  Alert, oriented and cooperative. Patient is in no acute distress.  Skin: Skin is warm and dry. No rash noted.   Cardiovascular: Normal heart rate noted  Respiratory: Normal respiratory effort, no problems with respiration noted  Abdomen: Soft, gravid, appropriate for gestational age.  Pain/Pressure: Present     Pelvic: Cervical exam deferred        Extremities: Normal range of motion.  Edema: None  Mental Status: Normal mood and affect. Normal behavior. Normal judgment and thought content.   Assessment and Plan:  Pregnancy: [id number] at [redacted]w[redacted]d 1. Supervision of other normal pregnancy, antepartum --Anticipatory guidance about next visits/weeks of pregnancy given. --Next visit in 4 weeks virtual  - Urine Culture - AFP, Serum, Open Spina Bifida - [place] MFM OB COMP + 14 WK; Future  2. [redacted] weeks gestation of pregnancy   Preterm labor symptoms and general obstetric precautions including but not limited to vaginal bleeding, contractions, leaking of fluid and fetal movement were reviewed in detail with the patient. Please refer to After Visit Summary for  other counseling recommendations.   No follow-ups on file.  Future Appointments  Date Time Provider [place]  08/23/2020  8:35 AM [facility], Kathie Dike, CNM [facility] None  08/23/2020  9:45 AM [facility] [place]    Fatima Blank, CNM

## 2020-07-28 LAB — AFP, SERUM, OPEN SPINA BIFIDA
AFP MoM: 1.42
AFP Value: 43.4 ng/mL
Gest. Age on Collection Date: 15.9 weeks
Maternal Age At EDD: 26.8 yr
OSBR Risk 1 IN: 6808
Test Results:: NEGATIVE
Weight: 202 [lb_av]

## 2020-07-29 LAB — URINE CULTURE

## 2020-08-01 ENCOUNTER — Telehealth: Payer: Self-pay | Admitting: Advanced Practice Midwife

## 2020-08-01 DIAGNOSIS — O99891 Other specified diseases and conditions complicating pregnancy: Secondary | ICD-10-CM

## 2020-08-01 DIAGNOSIS — R8271 Bacteriuria: Secondary | ICD-10-CM

## 2020-08-01 MED ORDER — CEFADROXIL 500 MG PO CAPS: 500.0000 mg | ORAL_CAPSULE | Freq: Two times a day (BID) | ORAL | 0 refills | Status: AC

## 2020-08-01 NOTE — Telephone Encounter (Signed)
Pt treated for UTI in pregnancy on 07/02/21, and TOC was done in office on 07/26/20.  TOC is positive for UTI with E coli.  Rx for Duricef 500 mg BID x 7 days sent to pharmacy. Message left for pt to return call to [facility] or to [facility] for lab results.

## 2020-08-23 ENCOUNTER — Telehealth: Payer: Self-pay

## 2020-08-23 ENCOUNTER — Other Ambulatory Visit: Payer: Self-pay

## 2020-08-23 ENCOUNTER — Telehealth: Payer: Medicaid Other | Admitting: Obstetrics

## 2020-08-23 ENCOUNTER — Ambulatory Visit: Payer: Medicaid Other | Attending: Advanced Practice Midwife

## 2020-08-23 DIAGNOSIS — Z3480 Encounter for supervision of other normal pregnancy, unspecified trimester: Secondary | ICD-10-CM | POA: Diagnosis not present

## 2020-08-23 NOTE — Telephone Encounter (Signed)
TC to pt to start Mychart visit  Called pt at 8:34am no answer LVM for pt to call back in next few mins of R/S appt.

## 2020-09-08 ENCOUNTER — Encounter: Payer: Medicaid Other | Admitting: Women's Health

## 2020-09-17 ENCOUNTER — Encounter: Payer: Self-pay | Admitting: Obstetrics and Gynecology

## 2020-09-17 ENCOUNTER — Other Ambulatory Visit: Payer: Self-pay

## 2020-09-17 ENCOUNTER — Ambulatory Visit (INDEPENDENT_AMBULATORY_CARE_PROVIDER_SITE_OTHER): Payer: Medicaid Other | Admitting: Obstetrics and Gynecology

## 2020-09-17 VITALS — BP 108/68 | HR 101 | Wt 216.9 lb

## 2020-09-17 DIAGNOSIS — Z3480 Encounter for supervision of other normal pregnancy, unspecified trimester: Secondary | ICD-10-CM

## 2020-09-17 DIAGNOSIS — N3000 Acute cystitis without hematuria: Secondary | ICD-10-CM

## 2020-09-17 DIAGNOSIS — Z3A23 23 weeks gestation of pregnancy: Secondary | ICD-10-CM

## 2020-09-17 DIAGNOSIS — Z6833 Body mass index (BMI) 33.0-33.9, adult: Secondary | ICD-10-CM

## 2020-09-17 MED ORDER — ASPIRIN EC 81 MG PO TBEC
81.0000 mg | DELAYED_RELEASE_TABLET | Freq: Every day | ORAL | 2 refills | Status: DC
Start: 2020-09-17 — End: 2021-01-12

## 2020-09-17 NOTE — Progress Notes (Signed)
Patient reports fetal movement with some back pain.

## 2020-09-17 NOTE — Progress Notes (Signed)
° °  PRENATAL VISIT NOTE  Subjective:  Erin Jacobs is a 25 y.o. [phone] at [redacted]w[redacted]d being seen today for ongoing prenatal care.  She is currently monitored for the following issues for this low-risk pregnancy and has Maternal varicella, non-immune; Familial hypospadias of penis; and Supervision of other normal pregnancy, antepartum on their problem list.  Patient reports backache.  Contractions: Not present. Vag. Bleeding: None.  Movement: Present. Denies leaking of fluid.   The following portions of the patient's history were reviewed and updated as appropriate: allergies, current medications, past family history, past medical history, past social history, past surgical history and problem list.   Objective:   Vitals:   09/17/20 1025  BP: 108/68  Pulse: (!) 101  Weight: 216 lb 14.4 oz (98.4 kg)    Fetal Status: Fetal Heart Rate (bpm): 146   Movement: Present     General:  Alert, oriented and cooperative. Patient is in no acute distress.  Skin: Skin is warm and dry. No rash noted.   Cardiovascular: Normal heart rate noted  Respiratory: Normal respiratory effort, no problems with respiration noted  Abdomen: Soft, gravid, appropriate for gestational age.  Pain/Pressure: Present     Pelvic: Cervical exam deferred        Extremities: Normal range of motion.  Edema: None  Mental Status: Normal mood and affect. Normal behavior. Normal judgment and thought content.   Assessment and Plan:  Pregnancy: [id number] at [redacted]w[redacted]d  1. Supervision of other normal pregnancy, antepartum Work restrictions letter given  2. Acute cystitis without hematuria Did not pick up new Rx, reports she took abx she had at home - Urine Culture  3. [redacted] weeks gestation of pregnancy  4. BMI 33.0-33.9,adult - [place] MFM OB FOLLOW UP; Future - start baby ASA based on BMI   Preterm labor symptoms and general obstetric precautions including but not limited to vaginal bleeding, contractions, leaking of fluid and fetal  movement were reviewed in detail with the patient. Please refer to After Visit Summary for other counseling recommendations.   Return in about 4 weeks (around 10/15/2020) for low OB, in person, 2 hr GTT, 3rd trim labs.  No future appointments.  Sloan Leiter, MD

## 2020-09-20 LAB — URINE CULTURE

## 2020-09-23 MED ORDER — CEFADROXIL 500 MG PO CAPS: 500.0000 mg | ORAL_CAPSULE | Freq: Two times a day (BID) | ORAL | 0 refills | Status: DC

## 2020-09-23 NOTE — Addendum Note (Signed)
Addended by: Vivien Rota on: 09/23/2020 12:22 PM   Modules accepted: Orders

## 2020-10-14 ENCOUNTER — Encounter: Payer: Self-pay | Admitting: Nurse Practitioner

## 2020-10-14 DIAGNOSIS — O2340 Unspecified infection of urinary tract in pregnancy, unspecified trimester: Secondary | ICD-10-CM | POA: Insufficient documentation

## 2020-10-15 ENCOUNTER — Ambulatory Visit (INDEPENDENT_AMBULATORY_CARE_PROVIDER_SITE_OTHER): Payer: Medicaid Other | Admitting: Nurse Practitioner

## 2020-10-15 ENCOUNTER — Other Ambulatory Visit: Payer: Medicaid Other

## 2020-10-15 ENCOUNTER — Other Ambulatory Visit: Payer: Self-pay

## 2020-10-15 ENCOUNTER — Encounter: Payer: Self-pay | Admitting: Nurse Practitioner

## 2020-10-15 VITALS — BP 135/73 | HR 96 | Wt 218.0 lb

## 2020-10-15 DIAGNOSIS — R030 Elevated blood-pressure reading, without diagnosis of hypertension: Secondary | ICD-10-CM

## 2020-10-15 DIAGNOSIS — Z3A27 27 weeks gestation of pregnancy: Secondary | ICD-10-CM

## 2020-10-15 DIAGNOSIS — Z3480 Encounter for supervision of other normal pregnancy, unspecified trimester: Secondary | ICD-10-CM

## 2020-10-15 DIAGNOSIS — O2342 Unspecified infection of urinary tract in pregnancy, second trimester: Secondary | ICD-10-CM

## 2020-10-15 DIAGNOSIS — Z23 Encounter for immunization: Secondary | ICD-10-CM | POA: Diagnosis not present

## 2020-10-15 NOTE — Progress Notes (Signed)
    Subjective:  Erin Jacobs is a 25 y.o. [phone] at [redacted]w[redacted]d being seen today for ongoing prenatal care.  She is currently monitored for the following issues for this low-risk pregnancy and has Maternal varicella, non-immune; Familial hypospadias of penis; Supervision of other normal pregnancy, antepartum; and UTI in pregnancy on their problem list.  Patient reports some swelling in her hands .  Contractions: Not present. Vag. Bleeding: None.  Movement: Present. Denies leaking of fluid.   The following portions of the patient's history were reviewed and updated as appropriate: allergies, current medications, past family history, past medical history, past social history, past surgical history and problem list. Problem list updated.  Objective:   Vitals:   10/15/20 0838  BP: 135/73  Pulse: 96  Weight: 218 lb (98.9 kg)    Fetal Status: Fetal Heart Rate (bpm): 135   Movement: Present     General:  Alert, oriented and cooperative. Patient is in no acute distress.  Skin: Skin is warm and dry. No rash noted.   Cardiovascular: Normal heart rate noted  Respiratory: Normal respiratory effort, no problems with respiration noted  Abdomen: Soft, gravid, appropriate for gestational age. Pain/Pressure: Present     Pelvic:  Cervical exam deferred        Extremities: Normal range of motion.  Edema: Mild pitting, slight indentation  Mental Status: Normal mood and affect. Normal behavior. Normal judgment and thought content.   Urinalysis:      Assessment and Plan:  Pregnancy: [id number] at [redacted]w[redacted]d  1. Supervision of other normal pregnancy, antepartum BP higher than usual and with edema reported in her hands, will check labs for baseline preeclampsia  - CBC - Glucose Tolerance, 2 Hours w/1 Hour - RPR - HIV Antibody (routine testing w rflx) - Culture, OB Urine  2. Need for Tdap vaccination  - Tdap vaccine greater than or equal to 7yo IM  3. Elevated BP without diagnosis of hypertension  -  Comprehensive metabolic panel - Protein / creatinine ratio, urine  4. Urinary tract infection in mother during second trimester of pregnancy Took meds for second UTI in this pregnancy Will check urine culture today and if clear, will prescribe suppression for rest of the pregnancy due to second UTI and she reports getting them over and over in previous pregnancies.  Preterm labor symptoms and general obstetric precautions including but not limited to vaginal bleeding, contractions, leaking of fluid and fetal movement were reviewed in detail with the patient. Please refer to After Visit Summary for other counseling recommendations.  Return in about 2 weeks (around 10/29/2020) for in person ROB.  Earlie Server, RN, MSN, NP-BC Nurse Practitioner, [facility] for [employer], [place] [facility] 10/15/2020 12:45 PM

## 2020-10-15 NOTE — Patient Instructions (Signed)
Etonogestrel implant What is this medicine? ETONOGESTREL (et oh noe JES trel) is a contraceptive (birth control) device. It is used to prevent pregnancy. It can be used for up to 3 years. This medicine may be used for other purposes; ask your health care provider or pharmacist if you have questions. COMMON BRAND NAME(S): Implanon, Nexplanon What should I tell my health care provider before I take this medicine? They need to know if you have any of these conditions:  abnormal vaginal bleeding  blood vessel disease or blood clots  breast, cervical, endometrial, ovarian, liver, or uterine cancer  diabetes  gallbladder disease  heart disease or recent heart attack  high blood pressure  high cholesterol or triglycerides  kidney disease  liver disease  migraine headaches  seizures  stroke  tobacco smoker  an unusual or allergic reaction to etonogestrel, anesthetics or antiseptics, other medicines, foods, dyes, or preservatives  pregnant or trying to get pregnant  breast-feeding How should I use this medicine? This device is inserted just under the skin on the inner side of your upper arm by a health care professional. Talk to your pediatrician regarding the use of this medicine in children. Special care may be needed. Overdosage: If you think you have taken too much of this medicine contact a poison control center or emergency room at once. NOTE: This medicine is only for you. Do not share this medicine with others. What if I miss a dose? This does not apply. What may interact with this medicine? Do not take this medicine with any of the following medications:  amprenavir  fosamprenavir This medicine may also interact with the following medications:  acitretin  aprepitant  armodafinil  bexarotene  bosentan  carbamazepine  certain medicines for fungal infections like fluconazole, ketoconazole, itraconazole and voriconazole  certain medicines to treat  hepatitis, HIV or AIDS  cyclosporine  felbamate  griseofulvin  lamotrigine  modafinil  oxcarbazepine  phenobarbital  phenytoin  primidone  rifabutin  rifampin  rifapentine  St. John's wort  topiramate This list may not describe all possible interactions. Give your health care provider a list of all the medicines, herbs, non-prescription drugs, or dietary supplements you use. Also tell them if you smoke, drink alcohol, or use illegal drugs. Some items may interact with your medicine. What should I watch for while using this medicine? This product does not protect you against HIV infection (AIDS) or other sexually transmitted diseases. You should be able to feel the implant by pressing your fingertips over the skin where it was inserted. Contact your doctor if you cannot feel the implant, and use a non-hormonal birth control method (such as condoms) until your doctor confirms that the implant is in place. Contact your doctor if you think that the implant may have broken or become bent while in your arm. You will receive a user card from your health care provider after the implant is inserted. The card is a record of the location of the implant in your upper arm and when it should be removed. Keep this card with your health records. What side effects may I notice from receiving this medicine? Side effects that you should report to your doctor or health care professional as soon as possible:  allergic reactions like skin rash, itching or hives, swelling of the face, lips, or tongue  breast lumps, breast tissue changes, or discharge  breathing problems  changes in emotions or moods  coughing up blood  if you feel that the implant   may have broken or bent while in your arm  high blood pressure  pain, irritation, swelling, or bruising at the insertion site  scar at site of insertion  signs of infection at the insertion site such as fever, and skin redness, pain or  discharge  signs and symptoms of a blood clot such as breathing problems; changes in vision; chest pain; severe, sudden headache; pain, swelling, warmth in the leg; trouble speaking; sudden numbness or weakness of the face, arm or leg  signs and symptoms of liver injury like dark yellow or brown urine; general ill feeling or flu-like symptoms; light-colored stools; loss of appetite; nausea; right upper belly pain; unusually weak or tired; yellowing of the eyes or skin  unusual vaginal bleeding, discharge Side effects that usually do not require medical attention (report to your doctor or health care professional if they continue or are bothersome):  acne  breast pain or tenderness  headache  irregular menstrual bleeding  nausea This list may not describe all possible side effects. Call your doctor for medical advice about side effects. You may report side effects to FDA at 1-800-FDA-1088. Where should I keep my medicine? This drug is given in a hospital or clinic and will not be stored at home. NOTE: This sheet is a summary. It may not cover all possible information. If you have questions about this medicine, talk to your doctor, pharmacist, or health care provider.  2021 Elsevier/Gold Standard (2019-04-15 11:33:04)  Intrauterine Device Information An intrauterine device (IUD) is a medical device that is inserted into the uterus to prevent pregnancy. It is a small, T-shaped device that has one or two nylon strings hanging down from it. The strings hang out of the lower part of the uterus (cervix) to allow for future IUD removal. There are two types of IUDs:  Hormone IUD. This type of IUD is made of plastic and contains the hormone progestin (synthetic progesterone). A hormone IUD may last 3-5 years.  Copper IUD. This type of IUD has copper wire wrapped around it. A copper IUD may last up to 10 years. How is an IUD inserted? An IUD is inserted through the vagina, through the cervix,  and into the uterus with a minor medical procedure. The procedure for IUD insertion may vary among health care providers and hospitals. How does an IUD work? Synthetic progesterone in a hormonal IUD prevents pregnancy by:  Thickening cervical mucus to prevent sperm from entering the uterus.  Thinning the uterine lining to prevent a fertilized egg from being implanted there. Copper in a copper IUD prevents pregnancy by making the uterus and fallopian tubes produce a fluid that kills sperm. What are the advantages of an IUD? Advantages of either type of IUD An IUD:  Is highly effective in preventing pregnancy.  Is reversible. You can become pregnant shortly after the IUD is removed.  Is low-maintenance and can stay in place for a long time.  Has no estrogen-related side effects.  Can be used when breastfeeding.  Is not associated with weight gain.  Can be inserted right after childbirth, an abortion, or a miscarriage. Advantages of a hormone IUD  If it is inserted within 7 days of your period starting, it works right after it has been inserted. If the hormone IUD is inserted at any other time in your cycle, you will need to use a backup method of birth control for 7 days after insertion.  It can make menstrual periods lighter or stop completely.  It can reduce menstrual cramping and other discomforts from menstrual periods.  It can be used for 3-5 years, depending on which IUD you have. Advantages of a copper IUD  It works right after it is inserted.  It can be used as a form of emergency birth control if it is inserted within 5 days after having unprotected sex.  It does not interfere with your body's natural hormones.  It can be used for up to 10 years. What are the disadvantages of an IUD?  An IUD may cause irregular menstrual bleeding for a period of time after insertion.  It is common to have pain during insertion and have cramping and vaginal bleeding after  insertion.  An IUD may cut the uterus (uterine perforation) when it is inserted. This is rare.  Pelvic inflammatory disease (PID) may happen after insertion of an IUD. PID is an infection in the uterus and fallopian tubes. The IUD does not cause the infection. The infection is usually from an unknown sexually transmitted infection (STI). This is rare, and it usually happens during the first 20 days after the IUD is inserted.  A copper IUD can make your menstrual flow heavier and more painful.  IUDs cannot prevent sexually transmitted infections (STIs). How is an IUD removed?   You will lie on your back with your knees bent and your feet in footrests (stirrups).  A device will be inserted into your vagina to spread apart the vaginal walls (speculum). This will allow your health care provider to see the strings attached to the IUD.  Your health care provider will use a small instrument (forceps) to grasp the IUD strings and will pull firmly until the IUD is removed. You may have some discomfort when the IUD is removed. Your health care provider may recommend taking over-the-counter pain relievers, such as ibuprofen, before the procedure. You may also have minor spotting for a few days after the procedure. The procedure for IUD removal may vary among health care providers and hospitals. Is an IUD right for me? If you are interested in an IUD, discuss it with your health care provider. He or she will make sure you are a good candidate for an IUD and will let you know more about the advantages, disadvantage, and possible side effects. This will allow you to make a decision about the device. Summary  An intrauterine device (IUD) is a medical device that is inserted in the uterus to prevent pregnancy. It is a small, T-shaped device that has one or two nylon strings hanging down from it.  A hormone IUD contains the hormone progestin (synthetic progesterone). A copper IUD has copper wire wrapped  around it.  Synthetic progesterone in a hormone IUD prevents pregnancy by thickening cervical mucus and thinning the walls of the uterus. Copper in a copper IUD prevents pregnancy by making the uterus and fallopian tubes produce a fluid that kills sperm.  A hormone IUD can be left in place for 3-5 years. A copper IUD can be left in place for up to 10 years.  An IUD is inserted and removed by a health care provider. You may feel some pain during insertion and removal. Your health care provider may recommend taking over-the-counter pain medicine, such as ibuprofen, before an IUD procedure. This information is not intended to replace advice given to you by your health care provider. Make sure you discuss any questions you have with your health care provider. Document Revised: 01/14/2020 Document Reviewed: 01/14/2020 Elsevier  Elsevier Patient Education  2021 Elsevier Inc.   

## 2020-10-15 NOTE — Progress Notes (Signed)
ROB [redacted]w[redacted]d  2 hr GTT Tdap: Received   CC: Swelling

## 2020-10-16 LAB — CBC
Hematocrit: 28.9 % — ABNORMAL LOW (ref 34.0–46.6)
Hemoglobin: 8.6 g/dL — ABNORMAL LOW (ref 11.1–15.9)
MCH: 22.5 pg — ABNORMAL LOW (ref 26.6–33.0)
MCHC: 29.8 g/dL — ABNORMAL LOW (ref 31.5–35.7)
MCV: 76 fL — ABNORMAL LOW (ref 79–97)
Platelets: 313 10*3/uL (ref 150–450)
RBC: 3.83 x10E6/uL (ref 3.77–5.28)
RDW: 15.6 % — ABNORMAL HIGH (ref 11.7–15.4)
WBC: 9 10*3/uL (ref 3.4–10.8)

## 2020-10-16 LAB — RPR: RPR Ser Ql: NONREACTIVE

## 2020-10-16 LAB — COMPREHENSIVE METABOLIC PANEL
ALT: 6 IU/L (ref 0–32)
AST: 11 IU/L (ref 0–40)
Albumin/Globulin Ratio: 1.1 — ABNORMAL LOW (ref 1.2–2.2)
Albumin: 3.4 g/dL — ABNORMAL LOW (ref 3.9–5.0)
Alkaline Phosphatase: 81 IU/L (ref 44–121)
BUN/Creatinine Ratio: 6 — ABNORMAL LOW (ref 9–23)
BUN: 4 mg/dL — ABNORMAL LOW (ref 6–20)
Bilirubin Total: 0.2 mg/dL (ref 0.0–1.2)
CO2: 18 mmol/L — ABNORMAL LOW (ref 20–29)
Calcium: 8.9 mg/dL (ref 8.7–10.2)
Chloride: 105 mmol/L (ref 96–106)
Creatinine, Ser: 0.68 mg/dL (ref 0.57–1.00)
Globulin, Total: 3 g/dL (ref 1.5–4.5)
Glucose: 74 mg/dL (ref 65–99)
Potassium: 3.9 mmol/L (ref 3.5–5.2)
Sodium: 138 mmol/L (ref 134–144)
Total Protein: 6.4 g/dL (ref 6.0–8.5)
eGFR: 123 mL/min/{1.73_m2} (ref >59)

## 2020-10-16 LAB — GLUCOSE TOLERANCE, 2 HOURS W/ 1HR
Glucose, 1 hour: 112 mg/dL (ref 65–179)
Glucose, 2 hour: 87 mg/dL (ref 65–152)
Glucose, Fasting: 73 mg/dL (ref 65–91)

## 2020-10-16 LAB — HIV ANTIBODY (ROUTINE TESTING W REFLEX): HIV Screen 4th Generation wRfx: NONREACTIVE

## 2020-10-16 LAB — PROTEIN / CREATININE RATIO, URINE
Creatinine, Urine: 193.6 mg/dL
Protein, Ur: 18.1 mg/dL
Protein/Creat Ratio: 93 mg/g creat (ref 0–200)

## 2020-10-17 ENCOUNTER — Encounter: Payer: Self-pay | Admitting: Nurse Practitioner

## 2020-10-18 ENCOUNTER — Other Ambulatory Visit: Payer: Self-pay

## 2020-10-18 ENCOUNTER — Ambulatory Visit: Payer: Medicaid Other | Attending: Obstetrics and Gynecology

## 2020-10-18 ENCOUNTER — Ambulatory Visit: Payer: Medicaid Other

## 2020-10-18 DIAGNOSIS — Z362 Encounter for other antenatal screening follow-up: Secondary | ICD-10-CM | POA: Insufficient documentation

## 2020-10-18 DIAGNOSIS — Z3A27 27 weeks gestation of pregnancy: Secondary | ICD-10-CM | POA: Diagnosis not present

## 2020-10-18 DIAGNOSIS — E669 Obesity, unspecified: Secondary | ICD-10-CM

## 2020-10-18 DIAGNOSIS — Z363 Encounter for antenatal screening for malformations: Secondary | ICD-10-CM

## 2020-10-18 DIAGNOSIS — O99212 Obesity complicating pregnancy, second trimester: Secondary | ICD-10-CM | POA: Insufficient documentation

## 2020-10-18 DIAGNOSIS — Z6833 Body mass index (BMI) 33.0-33.9, adult: Secondary | ICD-10-CM

## 2020-10-28 LAB — CULTURE, OB URINE

## 2020-11-01 ENCOUNTER — Encounter: Payer: Medicaid Other | Admitting: Advanced Practice Midwife

## 2020-11-03 ENCOUNTER — Ambulatory Visit (INDEPENDENT_AMBULATORY_CARE_PROVIDER_SITE_OTHER): Payer: Medicaid Other | Admitting: Nurse Practitioner

## 2020-11-03 ENCOUNTER — Other Ambulatory Visit: Payer: Self-pay

## 2020-11-03 VITALS — BP 127/72 | HR 95 | Wt 225.6 lb

## 2020-11-03 DIAGNOSIS — O99013 Anemia complicating pregnancy, third trimester: Secondary | ICD-10-CM

## 2020-11-03 DIAGNOSIS — Z3A30 30 weeks gestation of pregnancy: Secondary | ICD-10-CM

## 2020-11-03 DIAGNOSIS — O2342 Unspecified infection of urinary tract in pregnancy, second trimester: Secondary | ICD-10-CM

## 2020-11-03 DIAGNOSIS — Z3480 Encounter for supervision of other normal pregnancy, unspecified trimester: Secondary | ICD-10-CM

## 2020-11-03 MED ORDER — DOCUSATE CALCIUM 240 MG PO CAPS: 240.0000 mg | ORAL_CAPSULE | Freq: Every day | ORAL | 2 refills | Status: DC

## 2020-11-03 NOTE — Patient Instructions (Signed)

## 2020-11-03 NOTE — Progress Notes (Signed)
    Subjective:  Erin Jacobs is a 25 y.o. [phone] at [redacted]w[redacted]d being seen today for ongoing prenatal care.  She is currently monitored for the following issues for this low-risk pregnancy and has Maternal varicella, non-immune; Familial hypospadias of penis; Anemia in pregnancy; Supervision of other normal pregnancy, antepartum; and UTI in pregnancy on their problem list.  Patient reports no complaints.  Contractions: Not present. Vag. Bleeding: None.  Movement: Present. Denies leaking of fluid.   The following portions of the patient's history were reviewed and updated as appropriate: allergies, current medications, past family history, past medical history, past social history, past surgical history and problem list. Problem list updated.  Objective:   Vitals:   11/03/20 1454  BP: 127/72  Pulse: 95  Weight: 225 lb 9.6 oz (102.3 kg)    Fetal Status: Fetal Heart Rate (bpm): 138 Fundal Height: 31 cm Movement: Present     General:  Alert, oriented and cooperative. Patient is in no acute distress.  Skin: Skin is warm and dry. No rash noted.   Cardiovascular: Normal heart rate noted  Respiratory: Normal respiratory effort, no problems with respiration noted  Abdomen: Soft, gravid, appropriate for gestational age. Pain/Pressure: Present     Pelvic:  Cervical exam deferred        Extremities: Normal range of motion.  Edema: Trace  Mental Status: Normal mood and affect. Normal behavior. Normal judgment and thought content.   Urinalysis:      Assessment and Plan:  Pregnancy: [id number] at [redacted]w[redacted]d  1. Supervision of other normal pregnancy, antepartum Doing well. No edema. Baby moving well   2. Urinary tract infection in mother during second trimester of pregnancy Urine culture at last visit was cancelled by lab - no results Asked her to give another specimen today and given a cup with a label, but after client left, no urine could be found.\  - Culture, OB Urine  3. Anemia during  pregnancy in third trimester Taking iron supplement for one week and will recheck CBC in May. Is not having constipation but stool softener ordered in case she needs it. Is taking PNV daily also. Iron rich foods handout added to AVS.  - docusate calcium (SURFAK) 240 MG capsule; Take 1 capsule (240 mg total) by mouth daily.  Dispense: 30 capsule; Refill: 2   Preterm labor symptoms and general obstetric precautions including but not limited to vaginal bleeding, contractions, leaking of fluid and fetal movement were reviewed in detail with the patient. Please refer to After Visit Summary for other counseling recommendations.  Return in about 2 weeks (around 11/17/2020) for in person ROB.  Earlie Server, RN, MSN, NP-BC Nurse Practitioner, [facility] for [employer], [place] [facility] 11/03/2020 3:28 PM

## 2020-11-03 NOTE — Progress Notes (Signed)
Pt reports fetal movement with occasional back pain and pressure.

## 2020-11-11 ENCOUNTER — Other Ambulatory Visit: Payer: Self-pay

## 2020-11-11 DIAGNOSIS — O99013 Anemia complicating pregnancy, third trimester: Secondary | ICD-10-CM

## 2020-11-11 MED ORDER — PREPLUS 27-1 MG PO TABS: 1.0000 | ORAL_TABLET | Freq: Every day | ORAL | 13 refills | Status: DC

## 2020-11-17 ENCOUNTER — Encounter: Payer: Medicaid Other | Admitting: Obstetrics and Gynecology

## 2020-11-25 ENCOUNTER — Encounter: Payer: Medicaid Other | Admitting: Obstetrics and Gynecology

## 2020-11-29 ENCOUNTER — Encounter: Payer: Medicaid Other | Admitting: Advanced Practice Midwife

## 2020-12-02 ENCOUNTER — Ambulatory Visit (INDEPENDENT_AMBULATORY_CARE_PROVIDER_SITE_OTHER): Payer: Medicaid Other

## 2020-12-02 ENCOUNTER — Other Ambulatory Visit: Payer: Self-pay

## 2020-12-02 VITALS — BP 125/71 | HR 101 | Wt 228.0 lb

## 2020-12-02 DIAGNOSIS — Z3480 Encounter for supervision of other normal pregnancy, unspecified trimester: Secondary | ICD-10-CM

## 2020-12-02 DIAGNOSIS — O99013 Anemia complicating pregnancy, third trimester: Secondary | ICD-10-CM

## 2020-12-02 DIAGNOSIS — Z3A34 34 weeks gestation of pregnancy: Secondary | ICD-10-CM

## 2020-12-02 NOTE — Progress Notes (Signed)
   LOW-RISK PREGNANCY OFFICE VISIT  Patient name: Erin Jacobs MRN [id number]  Date of birth: [birth year] Chief Complaint:   Routine Prenatal Visit  Subjective:   Erin Jacobs is a 25 y.o. [id number] female at [redacted]w[redacted]d with an Estimated Date of Delivery: 01/11/21 being seen today for ongoing management of a low-risk pregnancy aeb has Maternal varicella, non-immune; Familial hypospadias of penis; Anemia in pregnancy; Supervision of other normal pregnancy, antepartum; and UTI in pregnancy on their problem list.  Patient presents today with c/o pelvic pressure.  Patient endorses fetal movement. Patient denies abdominal cramping or contractions.  Patient denies vaginal concerns including abnormal discharge, leaking of fluid, and bleeding.  Contractions: Irregular. Vag. Bleeding: None.  Movement: Present.  Reviewed past medical,surgical, social, obstetrical and family history as well as problem list, medications and allergies.  Objective   Vitals:   12/02/20 1352  BP: 125/71  Pulse: (!) 101  Weight: 228 lb (103.4 kg)  Body mass index is 35.71 kg/m.  Total Weight Gain:33 lb (15 kg)         Physical Examination:   General appearance: Well appearing, and in no distress  Mental status: Alert, oriented to person, place, and time  Skin: Warm & dry  Cardiovascular: Normal heart rate noted  Respiratory: Normal respiratory effort, no distress  Abdomen: Soft, gravid, nontender, AGA with    Pelvic: Cervical exam deferred           Extremities:    Fetal Status: Fetal Heart Rate (bpm): 140  Movement: Present   No results found for this or any previous visit (from the past 24 hour(s)).  Assessment & Plan:  Low-risk pregnancy of a 25 y.o., [id number] at [redacted]w[redacted]d with an Estimated Date of Delivery: 01/11/21   1. Supervision of other normal pregnancy, antepartum -Anticipatory guidance for upcoming appts. -Patient to next appt in 2 weeks for an in-person visit. -Educated on GBS bacteria including what it  is, why we test, and how and when we treat if needed.  2. [redacted] weeks gestation of pregnancy -Doing well. -Patient states she has started her maternity leave. -Will send urine for culture.   3. Anemia during pregnancy in third trimester -HgB at 8.6 on April 1 -Patient taking iron supplement when she can recall.  -Will repeat H&H today. -Informed that if it remains low will schedule for iron infusion.      Meds: No orders of the defined types were placed in this encounter.  Labs/procedures today:  Lab Orders  No laboratory test(s) ordered today     Reviewed: Preterm labor symptoms and general obstetric precautions including but not limited to vaginal bleeding, contractions, leaking of fluid and fetal movement were reviewed in detail with the patient.  All questions were answered.  Follow-up: Return in about 2 weeks (around 12/16/2020) for LROB with GBS.  No orders of the defined types were placed in this encounter.  Maryann Conners MSN, CNM 12/02/2020

## 2020-12-02 NOTE — Addendum Note (Signed)
Addended by: Lewie Loron D on: 12/02/2020 04:02 PM   Modules accepted: Orders

## 2020-12-02 NOTE — Patient Instructions (Signed)

## 2020-12-03 ENCOUNTER — Other Ambulatory Visit: Payer: Self-pay

## 2020-12-03 LAB — HEMOGLOBIN AND HEMATOCRIT, BLOOD
Hematocrit: 27.3 % — ABNORMAL LOW (ref 34.0–46.6)
Hemoglobin: 8.2 g/dL — ABNORMAL LOW (ref 11.1–15.9)

## 2020-12-04 LAB — URINE CULTURE, OB REFLEX

## 2020-12-04 LAB — CULTURE, OB URINE

## 2020-12-05 ENCOUNTER — Other Ambulatory Visit: Payer: Self-pay

## 2020-12-05 DIAGNOSIS — O99013 Anemia complicating pregnancy, third trimester: Secondary | ICD-10-CM

## 2020-12-16 ENCOUNTER — Other Ambulatory Visit: Payer: Self-pay

## 2020-12-16 ENCOUNTER — Other Ambulatory Visit (HOSPITAL_COMMUNITY)
Admission: RE | Admit: 2020-12-16 | Discharge: 2020-12-16 | Disposition: A | Discharge status: Home / Self Care | Payer: Medicaid Other | Source: Ambulatory Visit | Attending: Advanced Practice Midwife | Admitting: Advanced Practice Midwife

## 2020-12-16 ENCOUNTER — Ambulatory Visit (INDEPENDENT_AMBULATORY_CARE_PROVIDER_SITE_OTHER): Payer: Medicaid Other | Admitting: Advanced Practice Midwife

## 2020-12-16 VITALS — BP 141/65 | HR 101 | Wt 226.8 lb

## 2020-12-16 DIAGNOSIS — Z3A36 36 weeks gestation of pregnancy: Secondary | ICD-10-CM

## 2020-12-16 DIAGNOSIS — O99013 Anemia complicating pregnancy, third trimester: Secondary | ICD-10-CM

## 2020-12-16 DIAGNOSIS — Z0371 Encounter for suspected problem with amniotic cavity and membrane ruled out: Secondary | ICD-10-CM

## 2020-12-16 DIAGNOSIS — Z3480 Encounter for supervision of other normal pregnancy, unspecified trimester: Secondary | ICD-10-CM | POA: Diagnosis present

## 2020-12-16 DIAGNOSIS — G5600 Carpal tunnel syndrome, unspecified upper limb: Secondary | ICD-10-CM

## 2020-12-16 DIAGNOSIS — R030 Elevated blood-pressure reading, without diagnosis of hypertension: Secondary | ICD-10-CM

## 2020-12-16 DIAGNOSIS — O26899 Other specified pregnancy related conditions, unspecified trimester: Secondary | ICD-10-CM

## 2020-12-16 MED ORDER — WRIST BRACE/RIGHT MEDIUM MISC: 1.0000 | Freq: Every day | 0 refills | Status: DC

## 2020-12-16 MED ORDER — WRIST BRACE/LEFT MEDIUM MISC: 1.0000 | Freq: Every day | 0 refills | Status: DC

## 2020-12-16 NOTE — Progress Notes (Signed)
   PRENATAL VISIT NOTE  Subjective:  Erin Jacobs is a 25 y.o. [phone] at [redacted]w[redacted]d being seen today for ongoing prenatal care.  She is currently monitored for the following issues for this low-risk pregnancy and has Maternal varicella, non-immune; Familial hypospadias of penis; Anemia in pregnancy; Supervision of other normal pregnancy, antepartum; and UTI in pregnancy on their problem list.  Patient reports carpal tunnel symptoms.  Contractions: Irritability. Vag. Bleeding: None.  Movement: Present. Denies leaking of fluid.   The following portions of the patient's history were reviewed and updated as appropriate: allergies, current medications, past family history, past medical history, past social history, past surgical history and problem list.   Objective:   Vitals:   12/16/20 1402  BP: (!) 141/65  Pulse: (!) 101  Weight: 226 lb 12.8 oz (102.9 kg)    Fetal Status: Fetal Heart Rate (bpm): 136 Fundal Height: 37 cm Movement: Present  Presentation: Vertex  General:  Alert, oriented and cooperative. Patient is in no acute distress.  Skin: Skin is warm and dry. No rash noted.   Cardiovascular: Normal heart rate noted  Respiratory: Normal respiratory effort, no problems with respiration noted  Abdomen: Soft, gravid, appropriate for gestational age.  Pain/Pressure: Present     Pelvic: Cervical exam performed in the presence of a chaperone Dilation: 1 Effacement (%): 40 Station: -2  Extremities: Normal range of motion.  Edema: Trace  Mental Status: Normal mood and affect. Normal behavior. Normal judgment and thought content.   Assessment and Plan:  Pregnancy: [id number] at [redacted]w[redacted]d 1. Anemia during pregnancy in third trimester --IV iron infusion on 12/20/20  2. Supervision of other normal pregnancy, antepartum --Anticipatory guidance about next visits/weeks of pregnancy given. --BP  Check/appt in 4-5 days  - Culture, beta strep (group b only) - Cervicovaginal ancillary only( [facility])   3. [redacted] weeks gestation of pregnancy  - Cervicovaginal ancillary only( [place])  4. Elevated blood pressure reading without diagnosis of hypertension --BP borderline today, mildly elevated. No s/sx of PEC. -- Pt has 3 small children with her today and reports that this is stressful.   --PEC precautions reviewed --Pt to take BP on home cuff daily --BP check at iron infusion on 6/6 and appt on 6/8 in office  5. Encounter for suspected premature rupture of amniotic membranes, with rupture of membranes not found --Vaginal discharge/leaking fluid enough to wear a light pad x 3-4 days --Negative pooling, ferning, and nitrazine in office today --Cervicovaginal swab for increased discharge  Preterm labor symptoms and general obstetric precautions including but not limited to vaginal bleeding, contractions, leaking of fluid and fetal movement were reviewed in detail with the patient. Please refer to After Visit Summary for other counseling recommendations.   Return in about 1 week (around 12/23/2020).  Future Appointments  Date Time Provider [place]  12/20/2020  9:00 AM [facility] None  12/22/2020  3:50 PM Nugent, Gerrie Nordmann, NP [facility] None    Fatima Blank, CNM

## 2020-12-16 NOTE — Patient Instructions (Signed)
Things to Try After 37 weeks to Encourage Labor/Get Ready for Labor:   1.  Try the [employer] at [email or web address] daily to improve baby's position and encourage the onset of labor.  2. Walk a little and rest a little every day.  Change positions often.  3. Cervical Ripening: May try one or both a. Red Raspberry Leaf capsules or tea:  two 300mg  or 400mg  tablets with each meal, 2-3 times a day, or 1-3 cups of tea daily  Potential Side Effects Of Raspberry Leaf:  Most women do not experience any side effects from drinking raspberry leaf tea. However, nausea and loose stools are possible   b. Evening Primrose Oil capsules: take 1 capsule by mouth and place one capsule in the vagina every night.    Some of the potential side effects:  Upset stomach  Loose stools or diarrhea  Headaches  Nausea  4. Sex can also help the cervix ripen and encourage labor onset.    Labor Precautions Reasons to come to [facility] at [facility] and [place]:  1.  Contractions are  5 minutes apart or less, each last 1 minute, these have been going on for 1-2 hours, and you cannot walk or talk during them 2.  You have a large gush of fluid, or a trickle of fluid that will not stop and you have to wear a pad 3.  You have bleeding that is bright red, heavier than spotting--like menstrual bleeding (spotting can be normal in early labor or after a check of your cervix) 4.  You do not feel the baby moving like he/she normally does

## 2020-12-17 LAB — CERVICOVAGINAL ANCILLARY ONLY
Chlamydia: NEGATIVE
Comment: NEGATIVE
Comment: NEGATIVE
Comment: NORMAL
Neisseria Gonorrhea: NEGATIVE
Trichomonas: NEGATIVE

## 2020-12-20 ENCOUNTER — Ambulatory Visit (HOSPITAL_COMMUNITY)
Admission: RE | Admit: 2020-12-20 | Discharge: 2020-12-20 | Disposition: A | Discharge status: Home / Self Care | Payer: Medicaid Other | Source: Ambulatory Visit

## 2020-12-20 ENCOUNTER — Other Ambulatory Visit: Payer: Self-pay

## 2020-12-20 DIAGNOSIS — Z3A36 36 weeks gestation of pregnancy: Secondary | ICD-10-CM | POA: Diagnosis not present

## 2020-12-20 DIAGNOSIS — O99013 Anemia complicating pregnancy, third trimester: Secondary | ICD-10-CM | POA: Insufficient documentation

## 2020-12-20 LAB — CULTURE, BETA STREP (GROUP B ONLY): Strep Gp B Culture: NEGATIVE

## 2020-12-20 MED ORDER — SODIUM CHLORIDE 0.9 % IV SOLN
300.0000 mg | Freq: Once | INTRAVENOUS | Status: AC
  Administered 2020-12-20: 300 mg via INTRAVENOUS
  Filled 2020-12-20: qty 15

## 2020-12-20 MED ORDER — EPINEPHRINE PF 1 MG/ML IJ SOLN: 0.3000 mg | Freq: Once | INTRAMUSCULAR | Status: DC | PRN

## 2020-12-20 MED ORDER — DIPHENHYDRAMINE HCL 50 MG/ML IJ SOLN: 25.0000 mg | Freq: Once | INTRAMUSCULAR | Status: DC | PRN

## 2020-12-20 MED ORDER — ALBUTEROL SULFATE (2.5 MG/3ML) 0.083% IN NEBU: 2.5000 mg | INHALATION_SOLUTION | Freq: Once | RESPIRATORY_TRACT | Status: DC | PRN

## 2020-12-20 MED ORDER — METHYLPREDNISOLONE SODIUM SUCC 125 MG IJ SOLR: 125.0000 mg | Freq: Once | INTRAMUSCULAR | Status: DC | PRN

## 2020-12-20 MED ORDER — SODIUM CHLORIDE 0.9 % IV SOLN: INTRAVENOUS | Status: DC | PRN

## 2020-12-20 MED ORDER — SODIUM CHLORIDE 0.9 % IV BOLUS
500.0000 mL | Freq: Once | INTRAVENOUS | Status: DC | PRN
Start: 2020-12-20 — End: 2020-12-21

## 2020-12-20 NOTE — Discharge Instructions (Signed)

## 2020-12-22 ENCOUNTER — Encounter: Payer: Medicaid Other | Admitting: Women's Health

## 2020-12-22 ENCOUNTER — Other Ambulatory Visit: Payer: Self-pay

## 2020-12-22 ENCOUNTER — Encounter (HOSPITAL_COMMUNITY): Payer: Self-pay | Admitting: Obstetrics and Gynecology

## 2020-12-22 ENCOUNTER — Inpatient Hospital Stay (HOSPITAL_COMMUNITY)
Admission: AD | Admit: 2020-12-22 | Discharge: 2020-12-22 | Disposition: A | Discharge status: Home / Self Care | Payer: Medicaid Other | Attending: Obstetrics and Gynecology | Admitting: Obstetrics and Gynecology

## 2020-12-22 DIAGNOSIS — Z3689 Encounter for other specified antenatal screening: Secondary | ICD-10-CM | POA: Diagnosis not present

## 2020-12-22 DIAGNOSIS — O368130 Decreased fetal movements, third trimester, not applicable or unspecified: Secondary | ICD-10-CM | POA: Diagnosis not present

## 2020-12-22 DIAGNOSIS — Z3A37 37 weeks gestation of pregnancy: Secondary | ICD-10-CM | POA: Insufficient documentation

## 2020-12-22 NOTE — Discharge Instructions (Signed)
Fetal Movement Counts Patient Name: ________________________________________________ Patient Due Date: ____________________  What is a fetal movement count? A fetal movement count is the number of times that you feel your baby move during a certain amount of time. This may also be called a fetal kick count. A fetal movement count is recommended for every pregnant woman. You may be asked to start counting fetal movements as early as week 28 of your pregnancy. Pay attention to when your baby is most active. You may notice your baby's sleep and wake cycles. You may also notice things that make your baby move more. You should do a fetal movement count:  When your baby is normally most active.  At the same time each day. A good time to count movements is while you are resting, after having something to eat and drink. How do I count fetal movements? 1. Find a quiet, comfortable area. Sit, or lie down on your side. 2. Write down the date, the start time and stop time, and the number of movements that you felt between those two times. Take this information with you to your health care visits. 3. Write down your start time when you feel the first movement. 4. Count kicks, flutters, swishes, rolls, and jabs. You should feel at least 10 movements. 5. You may stop counting after you have felt 10 movements, or if you have been counting for 2 hours. Write down the stop time. 6. If you do not feel 10 movements in 2 hours, contact your health care provider for further instructions. Your health care provider may want to do additional tests to assess your baby's well-being. Contact a health care provider if:  You feel fewer than 10 movements in 2 hours.  Your baby is not moving like he or she usually does. Date: ____________ Start time: ____________ Stop time: ____________ Movements: ____________ Date: ____________ Start time: ____________ Stop time: ____________ Movements: ____________ Date: ____________  Start time: ____________ Stop time: ____________ Movements: ____________ Date: ____________ Start time: ____________ Stop time: ____________ Movements: ____________ Date: ____________ Start time: ____________ Stop time: ____________ Movements: ____________ Date: ____________ Start time: ____________ Stop time: ____________ Movements: ____________ Date: ____________ Start time: ____________ Stop time: ____________ Movements: ____________ Date: ____________ Start time: ____________ Stop time: ____________ Movements: ____________ Date: ____________ Start time: ____________ Stop time: ____________ Movements: ____________ This information is not intended to replace advice given to you by your health care provider. Make sure you discuss any questions you have with your health care provider. Document Revised: 02/20/2019 Document Reviewed: 02/20/2019 Elsevier Patient Education  [place].

## 2020-12-22 NOTE — MAU Note (Signed)
Erin Jacobs is a 25 y.o. at [redacted]w[redacted]d here in MAU reporting: DFM today, has felt some movement but states it is much less than normal. Ongoing pelvic and back pain.   Onset of complaint: today  Pain score: 8/10  Vitals:   12/22/20 1801  BP: 122/67  Pulse: (!) 102  Resp: 16  Temp: 98.7 F (37.1 C)  SpO2: 98%     FHT: 145  Lab orders placed from triage: none

## 2020-12-22 NOTE — MAU Provider Note (Signed)
History   [id number]   Chief Complaint  Patient presents with  . Pelvic Pain  . Decreased Fetal Movement    HPI Erin Jacobs is a 25 y.o. female  [phone] here with report of decreased fetal movement since this morning.  Reports feeling the baby move approximately 2 times in the past 24 hour.  Denies vaginal bleeding or leaking of fluid.  Feels occasional contraction.  Patient's last menstrual period was 04/06/2020 (approximate).  OB History  Gravida Para Term Preterm AB Living  5 3 3  0 1 3  SAB IAB Ectopic Multiple Live Births  1 0 0 0 3    # Outcome Date GA Lbr Len/2nd Weight Sex Delivery Anes PTL Lv  5 Current           4 Term 02/21/18 [id number] 2880 g M Vag-Spont None  LIV  3 Term 02/23/17 457w1d4:36 / 00:02 3155 g M Vag-Spont Local  LIV  2 Term 03/10/14 406w6d:18 / 00:34 2914 g M Vag-Spont EPI  LIV     Birth Comments: WNL, small abraision on roof of mouth  1 SAB 2014 8w073w0d   DEC    Past Medical History:  Diagnosis Date  . Anemia   . Anxiety   . Medical history non-contributory     Family History  Problem Relation Age of Onset  . Diabetes Maternal Aunt   . Cancer Maternal Aunt   . Diabetes Maternal Uncle   . Cancer Maternal Uncle   . Hypertension Maternal Uncle   . Stroke Maternal Uncle     Social History   Socioeconomic History  . Marital status: Single    Spouse name: Not on file  . Number of children: 3  . Years of education: Not on file  . Highest education level: Not on file  Occupational History  . Occupation: [employer]  Tobacco Use  . Smoking status: Never Smoker  . Smokeless tobacco: Never Used  Vaping Use  . Vaping Use: Former  Substance and Sexual Activity  . Alcohol use: No  . Drug use: Not Currently  . Sexual activity: Yes    Birth control/protection: None  Other Topics Concern  . Not on file  Social History Narrative  . Not on file   Social Determinants of Health   Financial Resource Strain: Not on file  Food  Insecurity: Not on file  Transportation Needs: Not on file  Physical Activity: Not on file  Stress: Not on file  Social Connections: Not on file    No Known Allergies  No current facility-administered medications on file prior to encounter.   Current Outpatient Medications on File Prior to Encounter  Medication Sig Dispense Refill  . aspirin EC 81 MG tablet Take 1 tablet (81 mg total) by mouth daily. Take after 12 weeks for prevention of preeclampsia later in pregnancy 300 tablet 2  . Blood Pressure Monitoring (BLOOD PRESSURE KIT) DEVI 1 kit by Does not apply route once a week. Check Blood Pressure regularly and record readings into the [employer] App.  Large Cuff.  DX O90.0 1 each 0  . Elastic Bandages & Supports (WRIST BRACE/LEFT MEDIUM) MISC 1 Device by Does not apply route daily. 1 each 0  . Elastic Bandages & Supports (WRIST BRACE/RIGHT MEDIUM) MISC 1 Device by Does not apply route daily. 1 each 0  . Prenatal Vit-Fe Fumarate-FA (PREPLUS) 27-1 MG TABS Take 1 tablet by mouth daily. 30 tablet 13  .  pyridOXINE (VITAMIN B-6) 25 MG tablet Take 1 tablet (25 mg total) by mouth every 8 (eight) hours. 30 tablet 0  . [DISCONTINUED] ferrous sulfate (FERROUSUL) 325 (65 FE) MG tablet Take 1 tablet (325 mg total) by mouth 2 (two) times daily. 60 tablet 1     Review of Systems  Constitutional: Negative.   Gastrointestinal: Negative.   Genitourinary: Negative.      Physical Exam   Vitals:   12/22/20 1757 12/22/20 1801 12/22/20 1852  BP:  122/67 114/61  Pulse:  (!) 102 (!) 106  Resp:  16   Temp:  98.7 F (37.1 C)   SpO2:  98%   Weight: 103.7 kg    Height: 5' 7"  (1.702 m)      Physical Exam Vitals and nursing note reviewed.  Constitutional:      General: She is not in acute distress.    Appearance: Normal appearance. She is obese.  HENT:     Head: Normocephalic and atraumatic.  Eyes:     General: No scleral icterus. Pulmonary:     Effort: Pulmonary effort is normal. No  respiratory distress.  Abdominal:     Palpations: Abdomen is soft.  Skin:    General: Skin is warm and dry.  Neurological:     Mental Status: She is alert.  Psychiatric:        Mood and Affect: Mood normal.        Thought Content: Thought content normal.    NST:  Baseline: 135 bpm, Variability: Good {> 6 bpm), Accelerations: Reactive and Decelerations: Absent  MAU Course  Procedures  MDM Patient reports return of normal fetal movement while in MAU. Reactive fetal tracing. Documented 8+ movements during her NST. Pt reassured.   Assessment and Plan   1. Decreased fetal movements in third trimester, single or unspecified fetus   2. NST (non-stress test) reactive   3. [redacted] weeks gestation of pregnancy    Reviewed fetal movements & reasons to return to MAU.    Jorje Guild, NP 12/22/2020 10:15 PM

## 2020-12-27 ENCOUNTER — Encounter (HOSPITAL_COMMUNITY): Payer: Medicaid Other

## 2020-12-30 ENCOUNTER — Other Ambulatory Visit: Payer: Self-pay

## 2020-12-30 ENCOUNTER — Ambulatory Visit (INDEPENDENT_AMBULATORY_CARE_PROVIDER_SITE_OTHER): Payer: Medicaid Other | Admitting: Obstetrics and Gynecology

## 2020-12-30 VITALS — BP 135/81 | HR 111 | Wt 231.2 lb

## 2020-12-30 DIAGNOSIS — Z3480 Encounter for supervision of other normal pregnancy, unspecified trimester: Secondary | ICD-10-CM

## 2020-12-30 DIAGNOSIS — Z3A38 38 weeks gestation of pregnancy: Secondary | ICD-10-CM

## 2020-12-30 NOTE — Progress Notes (Signed)
Pt reports fetal movement with irregular contractions.

## 2020-12-30 NOTE — Progress Notes (Signed)
   PRENATAL VISIT NOTE  Subjective:  Erin Jacobs is a 25 y.o. [phone] at [redacted]w[redacted]d being seen today for ongoing prenatal care.  She is currently monitored for the following issues for this low-risk pregnancy and has Maternal varicella, non-immune; Familial hypospadias of penis; Anemia in pregnancy; Supervision of other normal pregnancy, antepartum; and UTI in pregnancy on their problem list.  Patient reports no complaints.  Contractions: Irregular. Vag. Bleeding: None.  Movement: Present. Denies leaking of fluid.   Ready to have baby. Occasional contractions. Had headache that resolved with tylenol. No vision changes, no RUQ pain.   The following portions of the patient's history were reviewed and updated as appropriate: allergies, current medications, past family history, past medical history, past social history, past surgical history and problem list.   Objective:   Vitals:   12/30/20 1329  BP: 135/81  Pulse: (!) 111  Weight: 231 lb 3.2 oz (104.9 kg)    Fetal Status: Fetal Heart Rate (bpm): 140   Movement: Present     General:  Alert, oriented and cooperative. Patient is in no acute distress.  Skin: Skin is warm and dry. No rash noted.   Cardiovascular: Normal heart rate noted  Respiratory: Normal respiratory effort, no problems with respiration noted  Abdomen: Soft, gravid, appropriate for gestational age.  Pain/Pressure: Present     Pelvic: Cervical exam performed in the presence of a chaperone        Extremities: Normal range of motion.  Edema: Trace  Mental Status: Normal mood and affect. Normal behavior. Normal judgment and thought content.   Assessment and Plan:  Pregnancy: [id number] at [redacted]w[redacted]d 1. Supervision of other normal pregnancy, antepartum -discussed mile circuit, walking, intercourse, etc for stimulating labor  -having girl, wants nexplanon inpatient   2. [redacted] weeks gestation of pregnancy Discussed will schedule for IOL at 41 weeks if still pregnant next week    Term labor symptoms and general obstetric precautions including but not limited to vaginal bleeding, contractions, leaking of fluid and fetal movement were reviewed in detail with the patient. Please refer to After Visit Summary for other counseling recommendations.   No follow-ups on file.  No future appointments.  Janet Berlin, MD

## 2020-12-30 NOTE — Patient Instructions (Signed)
The [employer]uit  This circuit takes at least 90 minutes to complete so clear your schedule and make mental preparations so you can relax in your environment. The second step requires a lot of pillows so gather them up before beginning Before starting, you should empty your bladder! Have a nice drink nearby, and make sure it has a straw! If you are having contractions, this circuit should be done through contractions, try not to change positions between steps Before you begin...  "I named this 'circuit' after my friend Erin Jacobs, who shared and discussed it with me when I was working with a client whose labor seemed to be stalled out and no longer progressing... This circuit is useful to help get the baby lined up, ideally, in the "Left Occiput Anterior" (LOA) Position, both before labor begins and when some corrections need to be done during labor. Prenatally, this position set can help to rotate a baby. As a natural method of induction, this can help get things going if baby just needed a gentle nudge of position to set things off. To the best of my knowledge, this group of positions will not "hurt" a baby that is already lined up correctly." - Sharon Muza   Step One: Open-knee Chest Stay in this position for 30 minutes, start in cat/cow, then drop your chest as low as you can to the bed or the floor and your bottom as high as you can. Knees should be fairly wide apart, and the angle between the torso/thighs should be wider than 90 degrees. Wiggle around, prop with lots of pillows and use this time to get totally relaxed. This position allows the baby to scoot out of the pelvis a bit and gives them room to rotate, shift their head position, etc. If the pregnant person finds it helpful, careful positioning with a rebozo under the belly, with gentle tension from a support person behind can help maintain this position for the full 30 minutes.  Step Two:Exaggerated Left Side  Lying Roll to your left side, bringing your top leg as high as possible and keeping your bottom leg straight. Roll forward as much as possible, again using a lot of pillows. Sink into the bed and relax some more. If you fall asleep, that's totally okay and you can stay there! If not, stay here for at least another half an hour. Try and get your top right leg up towards your head and get as rolled over onto your belly as much as possible. If you repeat the circuit during labor, try alternating left and right sides. We know the photo the left is actually right side... just flip the image in your head.  Step Three: Moving and Lunges Lunge, walk stairs facing sideways, 2 at a time, (have a spotter downstairs of you!), take a walk outside with one foot on the curb and the other on the street, sit on a birth ball and hula- anything that's upright and putting your pelvis in open, asymmetrical positions. Spend at least 30 minutes doing this one as well to give your baby a chance to move down. If you are lunging or stair or curb walking, you should lunge/walk/go up stairs in the direction that feels better to you. The key with the lunge is that the toes of the higher leg and mom's belly button should be at right angles. Do not lunge over your knee, that closes the pelvis.     Erin Hamilton Jacobs: Circuit Creator - [email or web address] Sharon   Muza, CD, BDT (DONA), LCCE, FACCE: Supporting Content - [email or web address] Emily Weaver Brown: Photography - [email or web address] Kate Dewey CD/CDT (BAI): Print and Webmaster - [email or web address] [employer] [email or web address]  

## 2021-01-06 ENCOUNTER — Other Ambulatory Visit (HOSPITAL_COMMUNITY): Payer: Self-pay | Admitting: Advanced Practice Midwife

## 2021-01-06 ENCOUNTER — Encounter: Payer: Self-pay | Admitting: Obstetrics and Gynecology

## 2021-01-06 ENCOUNTER — Other Ambulatory Visit: Payer: Self-pay

## 2021-01-06 ENCOUNTER — Ambulatory Visit (INDEPENDENT_AMBULATORY_CARE_PROVIDER_SITE_OTHER): Payer: Medicaid Other | Admitting: Obstetrics and Gynecology

## 2021-01-06 VITALS — BP 119/77 | HR 105 | Wt 232.0 lb

## 2021-01-06 DIAGNOSIS — Z3480 Encounter for supervision of other normal pregnancy, unspecified trimester: Secondary | ICD-10-CM

## 2021-01-06 DIAGNOSIS — Z2839 Other underimmunization status: Secondary | ICD-10-CM

## 2021-01-06 DIAGNOSIS — O99013 Anemia complicating pregnancy, third trimester: Secondary | ICD-10-CM

## 2021-01-06 NOTE — Progress Notes (Signed)
+   Fetal movement. No complaints.

## 2021-01-06 NOTE — Patient Instructions (Signed)
Vaginal Delivery  Vaginal delivery means that you give birth by pushing your baby out of your birth canal (vagina). Your health care team will help you before, during, and after vaginaldelivery. Birth experiences are unique for every woman and every pregnancy, and birthexperiences vary depending on where you choose to give birth. What are the risks and benefits? Generally, this is safe. However, problems may occur, including: Bleeding. Infection. Damage to other structures such as vaginal tearing. Allergic reactions to medicines. Despite the risks, benefits of vaginal delivery include less risk of bleeding and infection and a shorter recovery time compared to a Cesarean delivery.Cesarean delivery, or C-section, is the surgical delivery of a baby. What happens when I arrive at the birth center or hospital? Once you are in labor and have been admitted into the hospital or birth center, your health care team may: Review your pregnancy history and any concerns that you have. Talk with you about your birth plan and discuss pain control options. Check your blood pressure, breathing, and heartbeat. Assess your baby's heartbeat. Monitor your uterus for contractions. Check whether your bag of water (amniotic sac) has broken (ruptured). Insert an IV into one of your veins. This may be used to give you fluids and medicines. Monitoring Your health care team may assess your contractions (uterine monitoring) and your baby's heart rate (fetal monitoring). You may need to be monitored: Often, but not continuously (intermittently). All the time or for long periods at a time (continuously). Continuous monitoring may be needed if: You are taking certain medicines, such as medicine to relieve pain or make your contractions stronger. You have pregnancy or labor complications. Monitoring may be done by: Placing a special stethoscope or a handheld monitoring device on your abdomen to check your baby's heartbeat  and to check for contractions. Placing monitors on your abdomen (external monitors) to record your baby's heartbeat and the frequency and length of contractions. Placing monitors inside your uterus through your vagina (internal monitors) to record your baby's heartbeat and the frequency, length, and strength of your contractions. Depending on the type of monitor, it may remain in your uterus or on your baby's head until birth. Telemetry. This is a type of continuous monitoring that can be done with external or internal monitors. Instead of having to stay in bed, you are able to move around. Physical exam Your health care team may perform frequent physical exams. This may include: Checking how and where your baby is positioned in your uterus. Checking your cervix to determine: Whether it is thinning out (effacing). Whether it is opening up (dilating). What happens during labor and delivery?  Normal labor and delivery is divided into the following three stages: Stage 1 This is the longest stage of labor. Throughout this stage, you will feel contractions. Contractions generally feel mild, infrequent, and irregular at first. They get stronger, more frequent, and more regular as you move through this stage. You may have contractions about every 2-3 minutes. This stage ends when your cervix is completely dilated to 4 inches (10 cm) and completely effaced. Stage 2 This stage starts once your cervix is completely effaced and dilated and lasts until the delivery of your baby. This is the stage where you will feel an urge to push your baby out of your vagina. You may feel stretching and burning pain, especially when the widest part of your baby's head passes through the vaginal opening (crowning). Once your baby is delivered, the umbilical cord will be clamped and cut.  Timing of cutting the cord will depend on your wishes, your baby's health, and your health care provider's practices. Your baby will be  placed on your bare chest (skin-to-skin contact) in an upright position and covered with a warm blanket. If you are choosing to breastfeed, watch your baby for feeding cues, like rooting or sucking, and help the baby to your breast for his or her first feeding. Stage 3 This stage starts immediately after the birth of your baby and ends after you deliver the placenta. This stage may take anywhere from 5 to 30 minutes. After your baby has been delivered, you will feel contractions as your body expels the placenta. These contractions also help your uterus get smaller and reduce bleeding. What can I expect after labor and delivery? After labor is over, you and your baby will be assessed closely until you are ready to go home. Your health care team will teach you how to care for yourself and your baby. You and your baby may be encouraged to stay in the same room (rooming in) during your hospital stay. This will help promote early bonding and successful breastfeeding. Your uterus will be checked and massaged regularly (fundal massage). You may continue to receive fluids and medicines through an IV. You will have some soreness and pain in your abdomen, vagina, and the area of skin between your vaginal opening and your anus (perineum). If an incision was made near your vagina (episiotomy) or if you had some vaginal tearing during delivery, cold compresses may be placed on your episiotomy or your tear. This helps to reduce pain and swelling. It is normal to have vaginal bleeding after delivery. Wear a sanitary pad for vaginal bleeding and discharge. Summary Vaginal delivery means that you will give birth by pushing your baby out of your birth canal (vagina). Your health care team will monitor you and your baby throughout the stages of labor. After you deliver your baby, your health care team will continue to assess you and your baby to ensure you are both recovering as expected after delivery. This  information is not intended to replace advice given to you by your health care provider. Make sure you discuss any questions you have with your healthcare provider. Document Revised: 05/31/2020 Document Reviewed: 05/31/2020 Elsevier Patient Education  2022 [employer].

## 2021-01-06 NOTE — Progress Notes (Signed)
Subjective:  Erin Jacobs is a 25 y.o. [phone] at [redacted]w[redacted]d being seen today for ongoing prenatal care.  She is currently monitored for the following issues for this low-risk pregnancy and has Maternal varicella, non-immune; Familial hypospadias of penis; Anemia in pregnancy; and Supervision of other normal pregnancy, antepartum on their problem list.  Patient reports general discomforts of preg.  Contractions: Irregular. Vag. Bleeding: None.  Movement: Present. Denies leaking of fluid.   The following portions of the patient's history were reviewed and updated as appropriate: allergies, current medications, past family history, past medical history, past social history, past surgical history and problem list. Problem list updated.  Objective:   Vitals:   01/06/21 1442  BP: 119/77  Pulse: (!) 105  Weight: 232 lb (105.2 kg)    Fetal Status: Fetal Heart Rate (bpm): 132   Movement: Present     General:  Alert, oriented and cooperative. Patient is in no acute distress.  Skin: Skin is warm and dry. No rash noted.   Cardiovascular: Normal heart rate noted  Respiratory: Normal respiratory effort, no problems with respiration noted  Abdomen: Soft, gravid, appropriate for gestational age. Pain/Pressure: Present     Pelvic:  Cervical exam performed        Extremities: Normal range of motion.  Edema: Trace  Mental Status: Normal mood and affect. Normal behavior. Normal judgment and thought content.   Urinalysis:      Assessment and Plan:  Pregnancy: [id number] at [redacted]w[redacted]d  1. Supervision of other normal pregnancy, antepartum Labor precautions IOL scheduled  2. Maternal varicella, non-immune Vaccine PP  3. Anemia during pregnancy in third trimester Stable  Term labor symptoms and general obstetric precautions including but not limited to vaginal bleeding, contractions, leaking of fluid and fetal movement were reviewed in detail with the patient. Please refer to After Visit Summary for other  counseling recommendations.  Return in about 1 week (around 01/13/2021) for OB visit, face to face, any provider, NST and AFI.   Chancy Milroy, MD

## 2021-01-12 ENCOUNTER — Other Ambulatory Visit: Payer: Self-pay

## 2021-01-12 ENCOUNTER — Encounter (HOSPITAL_COMMUNITY): Payer: Self-pay | Admitting: *Deleted

## 2021-01-12 ENCOUNTER — Ambulatory Visit (INDEPENDENT_AMBULATORY_CARE_PROVIDER_SITE_OTHER): Payer: Medicaid Other | Admitting: Family Medicine

## 2021-01-12 ENCOUNTER — Other Ambulatory Visit: Payer: Medicaid Other

## 2021-01-12 ENCOUNTER — Encounter: Payer: Self-pay | Admitting: Family Medicine

## 2021-01-12 ENCOUNTER — Telehealth (HOSPITAL_COMMUNITY): Payer: Self-pay | Admitting: *Deleted

## 2021-01-12 ENCOUNTER — Ambulatory Visit (INDEPENDENT_AMBULATORY_CARE_PROVIDER_SITE_OTHER): Payer: Medicaid Other

## 2021-01-12 VITALS — BP 135/79 | HR 101 | Wt 235.0 lb

## 2021-01-12 DIAGNOSIS — O480 Post-term pregnancy: Secondary | ICD-10-CM

## 2021-01-12 DIAGNOSIS — Z2839 Other underimmunization status: Secondary | ICD-10-CM

## 2021-01-12 DIAGNOSIS — Z3480 Encounter for supervision of other normal pregnancy, unspecified trimester: Secondary | ICD-10-CM

## 2021-01-12 DIAGNOSIS — O99013 Anemia complicating pregnancy, third trimester: Secondary | ICD-10-CM

## 2021-01-12 DIAGNOSIS — Z3A40 40 weeks gestation of pregnancy: Secondary | ICD-10-CM

## 2021-01-12 NOTE — Patient Instructions (Signed)

## 2021-01-12 NOTE — Telephone Encounter (Signed)
Preadmission screen  

## 2021-01-12 NOTE — Progress Notes (Signed)
   Subjective:  Erin Jacobs is a 25 y.o. [phone] at 65w1dbeing seen today for ongoing prenatal care.  She is currently monitored for the following issues for this low-risk pregnancy and has Maternal varicella, non-immune; Familial hypospadias of penis; Anemia in pregnancy; and Supervision of other normal pregnancy, antepartum on their problem list.  Patient reports no complaints.  Contractions: Irregular. Vag. Bleeding: None.  Movement: Present. Denies leaking of fluid.   The following portions of the patient's history were reviewed and updated as appropriate: allergies, current medications, past family history, past medical history, past social history, past surgical history and problem list. Problem list updated.  Objective:   Vitals:   01/12/21 1334  BP: 135/79  Pulse: (!) 101  Weight: 235 lb (106.6 kg)    Fetal Status:     Movement: Present     General:  Alert, oriented and cooperative. Patient is in no acute distress.  Skin: Skin is warm and dry. No rash noted.   Cardiovascular: Normal heart rate noted  Respiratory: Normal respiratory effort, no problems with respiration noted  Abdomen: Soft, gravid, appropriate for gestational age. Pain/Pressure: Present     Pelvic: Vag. Bleeding: None     Cervical exam deferred        Extremities: Normal range of motion.     Mental Status: Normal mood and affect. Normal behavior. Normal judgment and thought content.   Urinalysis:      Assessment and Plan:  Pregnancy: [id number] 467w1d1. Supervision of other normal pregnancy, antepartum BP and FHR normal NST reactive, AFI normal IOL scheduled for 01/18/2021  2. Maternal varicella, non-immune Offer MMR post partum  3. Anemia during pregnancy in third trimester S/p Venofer on 12/20/20  Term labor symptoms and general obstetric precautions including but not limited to vaginal bleeding, contractions, leaking of fluid and fetal movement were reviewed in detail with the patient. Please  refer to After Visit Summary for other counseling recommendations.  Return in 6 weeks (on 02/23/2021) for PP check.   EcClarnce FlockMD

## 2021-01-12 NOTE — Progress Notes (Signed)
Pt is scheduled for IOL 01/18/21

## 2021-01-14 ENCOUNTER — Other Ambulatory Visit (HOSPITAL_COMMUNITY)
Admission: RE | Admit: 2021-01-14 | Discharge: 2021-01-14 | Disposition: A | Discharge status: Home / Self Care | Payer: Medicaid Other | Source: Ambulatory Visit | Attending: Obstetrics and Gynecology | Admitting: Obstetrics and Gynecology

## 2021-01-14 DIAGNOSIS — Z20822 Contact with and (suspected) exposure to covid-19: Secondary | ICD-10-CM | POA: Diagnosis not present

## 2021-01-14 DIAGNOSIS — Z01812 Encounter for preprocedural laboratory examination: Secondary | ICD-10-CM | POA: Insufficient documentation

## 2021-01-14 LAB — SARS CORONAVIRUS 2 (TAT 6-24 HRS): SARS Coronavirus 2: NEGATIVE

## 2021-01-17 ENCOUNTER — Inpatient Hospital Stay (HOSPITAL_COMMUNITY)
Admission: AD | Admit: 2021-01-17 | Discharge: 2021-01-19 | DRG: 807 | Disposition: A | Discharge status: Home / Self Care | Payer: Medicaid Other | Attending: Obstetrics & Gynecology | Admitting: Obstetrics & Gynecology

## 2021-01-17 ENCOUNTER — Encounter (HOSPITAL_COMMUNITY): Payer: Self-pay | Admitting: Obstetrics and Gynecology

## 2021-01-17 ENCOUNTER — Other Ambulatory Visit: Payer: Self-pay

## 2021-01-17 DIAGNOSIS — O368130 Decreased fetal movements, third trimester, not applicable or unspecified: Secondary | ICD-10-CM | POA: Diagnosis present

## 2021-01-17 DIAGNOSIS — Z30017 Encounter for initial prescription of implantable subdermal contraceptive: Secondary | ICD-10-CM

## 2021-01-17 DIAGNOSIS — O99019 Anemia complicating pregnancy, unspecified trimester: Secondary | ICD-10-CM | POA: Diagnosis present

## 2021-01-17 DIAGNOSIS — O368191 Decreased fetal movements, unspecified trimester, fetus 1: Secondary | ICD-10-CM | POA: Diagnosis not present

## 2021-01-17 DIAGNOSIS — Z3A40 40 weeks gestation of pregnancy: Secondary | ICD-10-CM | POA: Diagnosis not present

## 2021-01-17 DIAGNOSIS — O480 Post-term pregnancy: Secondary | ICD-10-CM | POA: Diagnosis present

## 2021-01-17 DIAGNOSIS — O9902 Anemia complicating childbirth: Secondary | ICD-10-CM | POA: Diagnosis present

## 2021-01-17 DIAGNOSIS — Z2839 Supervision of other high risk pregnancies, unspecified trimester: Secondary | ICD-10-CM | POA: Diagnosis present

## 2021-01-17 DIAGNOSIS — Z3480 Encounter for supervision of other normal pregnancy, unspecified trimester: Secondary | ICD-10-CM

## 2021-01-17 LAB — CBC
HCT: 33.4 % — ABNORMAL LOW (ref 36.0–46.0)
Hemoglobin: 9.6 g/dL — ABNORMAL LOW (ref 12.0–15.0)
MCH: 22.5 pg — ABNORMAL LOW (ref 26.0–34.0)
MCHC: 28.7 g/dL — ABNORMAL LOW (ref 30.0–36.0)
MCV: 78.2 fL — ABNORMAL LOW (ref 80.0–100.0)
Platelets: 333 10*3/uL (ref 150–400)
RBC: 4.27 MIL/uL (ref 3.87–5.11)
RDW: 23 % — ABNORMAL HIGH (ref 11.5–15.5)
WBC: 7.4 10*3/uL (ref 4.0–10.5)
nRBC: 0 % (ref 0.0–0.2)

## 2021-01-17 LAB — POCT FERN TEST: POCT Fern Test: NEGATIVE

## 2021-01-17 LAB — TYPE AND SCREEN
ABO/RH(D): B POS
Antibody Screen: NEGATIVE

## 2021-01-17 MED ORDER — MISOPROSTOL 25 MCG QUARTER TABLET: 25.0000 ug | ORAL_TABLET | ORAL | Status: DC | PRN

## 2021-01-17 MED ORDER — ACETAMINOPHEN 325 MG PO TABS: 650.0000 mg | ORAL_TABLET | ORAL | Status: DC | PRN

## 2021-01-17 MED ORDER — LACTATED RINGERS IV SOLN
500.0000 mL | INTRAVENOUS | Status: DC | PRN
  Administered 2021-01-17: 1000 mL via INTRAVENOUS

## 2021-01-17 MED ORDER — LACTATED RINGERS IV SOLN: INTRAVENOUS | Status: DC

## 2021-01-17 MED ORDER — LIDOCAINE HCL (PF) 1 % IJ SOLN: 30.0000 mL | INTRAMUSCULAR | Status: DC | PRN

## 2021-01-17 MED ORDER — OXYCODONE-ACETAMINOPHEN 5-325 MG PO TABS: 2.0000 | ORAL_TABLET | ORAL | Status: DC | PRN

## 2021-01-17 MED ORDER — OXYCODONE-ACETAMINOPHEN 5-325 MG PO TABS: 1.0000 | ORAL_TABLET | ORAL | Status: DC | PRN

## 2021-01-17 MED ORDER — FENTANYL CITRATE (PF) 100 MCG/2ML IJ SOLN
50.0000 ug | INTRAMUSCULAR | Status: DC | PRN
  Administered 2021-01-17 – 2021-01-18 (×4): 100 ug via INTRAVENOUS
  Filled 2021-01-17 (×4): qty 2

## 2021-01-17 MED ORDER — TERBUTALINE SULFATE 1 MG/ML IJ SOLN: 0.2500 mg | Freq: Once | INTRAMUSCULAR | Status: DC | PRN

## 2021-01-17 MED ORDER — ONDANSETRON HCL 4 MG/2ML IJ SOLN: 4.0000 mg | Freq: Four times a day (QID) | INTRAMUSCULAR | Status: DC | PRN

## 2021-01-17 MED ORDER — OXYTOCIN-SODIUM CHLORIDE 30-0.9 UT/500ML-% IV SOLN
1.0000 m[IU]/min | INTRAVENOUS | Status: DC
  Administered 2021-01-17: 2 m[IU]/min via INTRAVENOUS
  Filled 2021-01-17: qty 500

## 2021-01-17 MED ORDER — ZOLPIDEM TARTRATE 5 MG PO TABS: 5.0000 mg | ORAL_TABLET | Freq: Every evening | ORAL | Status: DC | PRN

## 2021-01-17 MED ORDER — OXYTOCIN BOLUS FROM INFUSION
333.0000 mL | Freq: Once | INTRAVENOUS | Status: AC
  Administered 2021-01-18: 333 mL via INTRAVENOUS

## 2021-01-17 MED ORDER — SOD CITRATE-CITRIC ACID 500-334 MG/5ML PO SOLN: 30.0000 mL | ORAL | Status: DC | PRN

## 2021-01-17 MED ORDER — OXYTOCIN-SODIUM CHLORIDE 30-0.9 UT/500ML-% IV SOLN: 2.5000 [IU]/h | INTRAVENOUS | Status: DC

## 2021-01-17 NOTE — MAU Note (Signed)
...  Erin Jacobs is a 25 y.o. at [redacted]w[redacted]d here in MAU reporting: DFM since yesterday evening. Denies any fetal movement today. Endorses occasional CTX throughout the day. No VB but states she has been leaking fluids since 2000 last night. GBS-.  FHT: 141 doppler   BP: 122/64 P: 103 T: 98.5 F oral R: 19 O2: 100%

## 2021-01-17 NOTE — H&P (Signed)
Obstetric History and Physical  Erin Jacobs is a 25 y.o. [phone] with IUP at [redacted]w[redacted]d presenting to MAU for decreased fetal movement since yesterday evening, denies any fetal movement today.  Patient states she has been having  occasional contractions,  no  vaginal bleeding, and possible leaking fluids since 2000 last night.  Scheduled for IOL tomorrow 01/18/21 for postdates.  Prenatal Course Source of Care: [facility] Pregnancy complications or risks: Patient Active Problem List   Diagnosis Date Noted   Post term pregnancy over 40 weeks 01/17/2021   Supervision of other normal pregnancy, antepartum 05/24/2020   Anemia in pregnancy 11/27/2017   Maternal varicella, non-immune 10/23/2017   Office Location [facility] Dating  LMP, c/w 13 week [place]  Language  ENGLISH Anatomy US  Normal  Flu Vaccine  DECLINED 05/24/2020 Genetic Screen  NIPS: low risk female AFP:  Screen neg   TDaP Vaccine   10/15/20 GTT Third trimester Nml 2 hr GTT  COVID Vaccine  DECLINED 05/24/2020   LAB RESULTS     Blood Type B/Positive/-- (12/13 1158)   Feeding Plan  BREASTFEED Antibody Negative (12/13 1158)  Contraception  NEXPLANON Rubella 16.60 (12/13 1158)  Circumcision  YES RPR Non Reactive (04/01 0941)   Pediatrician  [facility] HBsAg Negative (12/13 1158)   Support Person HUSBAND HCVAb Negative (12/13 1158)  Prenatal Classes No HIV Non Reactive (04/01 0941)     BP Cuff 05/24/2020 GBS Negative/-- (06/02 0244)    Pap 06/28/2020 NIELM    Past Medical History:  Diagnosis Date   Anemia    Anxiety    Familial hypospadias of penis 10/23/2017    Past Surgical History:  Procedure Laterality Date   leg suregry- childhood      OB History  Gravida Para Term Preterm AB Living  5 3 3  0 1 3  SAB IAB Ectopic Multiple Live Births  1 0 0 0 3    # Outcome Date GA Lbr Len/2nd Weight Sex Delivery Anes PTL Lv  5 Current           4 Term 02/21/18 [redacted]w[redacted]d 576:54 2880 g M Vag-Spont None  LIV  3 Term 02/23/17 [redacted]w[redacted]d 14:36 /  00:02 3155 g M Vag-Spont Local  LIV  2 Term 03/10/14 [redacted]w[redacted]d 10:18 / 00:34 2914 g M Vag-Spont EPI  LIV     Birth Comments: WNL, small abraision on roof of mouth  1 SAB 2014 [redacted]w[redacted]d       DEC    Social History   Socioeconomic History   Marital status: Single    Spouse name: Not on file   Number of children: 3   Years of education: Not on file   Highest education level: Not on file  Occupational History   Occupation: [employer]  Tobacco Use   Smoking status: Never   Smokeless tobacco: Never  Vaping Use   Vaping Use: Former  Substance and Sexual Activity   Alcohol use: No   Drug use: Not Currently   Sexual activity: Yes    Birth control/protection: None  Other Topics Concern   Not on file  Social History Narrative   Not on file   Social Determinants of Health   Financial Resource Strain: Not on file  Food Insecurity: Not on file  Transportation Needs: Not on file  Physical Activity: Not on file  Stress: Not on file  Social Connections: Not on file    Family History  Problem Relation Age of Onset  Diabetes Maternal Aunt    Cancer Maternal Aunt    Diabetes Maternal Uncle    Cancer Maternal Uncle    Hypertension Maternal Uncle    Stroke Maternal Uncle     Medications Prior to Admission  Medication Sig Dispense Refill Last Dose   Prenatal Vit-Fe Fumarate-FA (PREPLUS) 27-1 MG TABS Take 1 tablet by mouth daily. 30 tablet 13     No Known Allergies  Review of Systems: Negative except for what is mentioned in HPI.  Physical Exam: BP: 122/64  P: 103 T: 98.5 F oral  R: 19  O2: 100% CONSTITUTIONAL: Well-developed, well-nourished female in no acute distress.  HENT:  Normocephalic, atraumatic, External right and left ear normal.  EYES: Conjunctivae and EOM are normal. Pupils are equal, round, and reactive to light. No scleral icterus.  NECK: Normal range of motion, supple, no masses SKIN: Skin is warm and dry. No rash noted. Not diaphoretic. No erythema. No  pallor. NEUROLOGIC: Alert and oriented to person, place, and time. Normal reflexes, muscle tone coordination. No cranial nerve deficit noted. PSYCHIATRIC: Normal mood and affect. Normal behavior. Normal judgment and thought content. CARDIOVASCULAR: Normal heart rate noted, regular rhythm RESPIRATORY: Effort and breath sounds normal, no problems with respiration noted ABDOMEN: Soft, nontender, nondistended, gravid. MUSCULOSKELETAL: Normal range of motion. No edema and no tenderness. 2+ distal pulses.  Cervical Exam: Dilatation 3 cm   Effacement 50 %   Station -3  BBOW palpated during exam> fern test collected and pending.  Presentation: cephalic FHT:  Baseline rate 120 bpm   Variability minimal Accelerations none  Decelerations none Contractions: Irregular   Pertinent Labs/Studies:   No results found for this or any previous visit (from the past 24 hour(s)).  Assessment : Erin Jacobs is a 25 y.o. [phone] at [redacted]w[redacted]d with decreased fetal movement and Category 2 FHR tracing being admitted for delivery.  Plan: Will admit to L&D Will give IV fluids, do position changes to see if that will help with her FHR tracing. Will proceed with IOL. Induction/Augmentation with pitocin ordered as per protocol.  IV analgesia as needed, declines epidural for now. GBS negative Hopeful for vaginal delivery, but will consider consider cesarean section for persistent Category 2 FHR tracing or worsening fetal status.    Verita Schneiders, MD, [place] for [employer], [place]

## 2021-01-17 NOTE — Progress Notes (Signed)
Erin Jacobs is a 25 y.o. [phone] at [redacted]w[redacted]d.  Subjective: Mild pressure w/ UC's.   Objective: BP 117/67   Pulse 91   Temp 98.5 F (36.9 C) (Oral)   Resp 15   LMP 04/06/2020 (Approximate)   SpO2 100%    FHT:  FHR: 130 bpm, variability: min-mod,  accelerations:  15x15,  decelerations:  none UC:   Irreg, mild Dilation: 3 Effacement (%): 50 Station: -3 Presentation: Vertex Exam by:: Darliss Ridgel RN  Labs: Results for orders placed or performed during the hospital encounter of 01/17/21 (from the past 24 hour(s))  CBC     Status: Abnormal   Collection Time: 01/17/21  3:02 PM  Result Value Ref Range   WBC 7.4 4.0 - 10.5 K/uL   RBC 4.27 3.87 - 5.11 MIL/uL   Hemoglobin 9.6 (L) 12.0 - 15.0 g/dL   HCT 33.4 (L) 36.0 - 46.0 %   MCV 78.2 (L) 80.0 - 100.0 fL   MCH 22.5 (L) 26.0 - 34.0 pg   MCHC 28.7 (L) 30.0 - 36.0 g/dL   RDW 23.0 (H) 11.5 - 15.5 %   Platelets 333 150 - 400 K/uL   nRBC 0.0 0.0 - 0.2 %  Type and screen     Status: None   Collection Time: 01/17/21  3:20 PM  Result Value Ref Range   ABO/RH(D) B POS    Antibody Screen NEG    Sample Expiration      01/20/2021,2359 Performed at [facility], [address], [place], [place] [address]   Maryann Alar Test     Status: None   Collection Time: 01/17/21  3:49 PM  Result Value Ref Range   POCT Fern Test Negative = intact amniotic membranes     Assessment / Plan: [redacted]w[redacted]d week IUP Labor: Early/IOL. Continue increasing pitocin to achieve adequate contractions. AROM PRN.  Fetal Wellbeing:  Category I-II, overall reassuring Pain Control:  Comfort measures Anticipated MOD:  SVD  Tamala Julian [place], [place] 01/17/2021 6:10 PM

## 2021-01-17 NOTE — Progress Notes (Signed)
Labor Progress Note Erin Jacobs is a 25 y.o. [phone] at [redacted]w[redacted]d presented for IOL for DFM, PD S: Breathing through contractions   O:  BP 110/68   Pulse 80   Temp 98.7 F (37.1 C) (Axillary)   Resp 16   LMP 04/06/2020 (Approximate)   SpO2 100%  EFM: baseline 135/mod variability/pos accels/no decels   CVE: Dilation: 4 Effacement (%): 60 Station: -2 Presentation: Vertex Exam by:: Dr. Berniece Andreas   A&P: 25 y.o. [id number] [redacted]w[redacted]d here for IOL for PD, DFM #Labor: Progressing well. Pitocin started at 1610, AROM now for clear fluids.  #Pain: desires unmedicated #FWB: cat I  #GBS negative   Janet Berlin, MD 9:03 PM

## 2021-01-18 ENCOUNTER — Encounter (HOSPITAL_COMMUNITY): Payer: Self-pay | Admitting: Obstetrics and Gynecology

## 2021-01-18 ENCOUNTER — Inpatient Hospital Stay (HOSPITAL_COMMUNITY)
Admission: AD | Admit: 2021-01-18 | Payer: Medicaid Other | Source: Home / Self Care | Admitting: Obstetrics & Gynecology

## 2021-01-18 ENCOUNTER — Inpatient Hospital Stay (HOSPITAL_COMMUNITY): Payer: Medicaid Other

## 2021-01-18 DIAGNOSIS — O368191 Decreased fetal movements, unspecified trimester, fetus 1: Secondary | ICD-10-CM

## 2021-01-18 DIAGNOSIS — O9902 Anemia complicating childbirth: Secondary | ICD-10-CM

## 2021-01-18 DIAGNOSIS — O480 Post-term pregnancy: Secondary | ICD-10-CM

## 2021-01-18 DIAGNOSIS — Z3A40 40 weeks gestation of pregnancy: Secondary | ICD-10-CM

## 2021-01-18 LAB — RPR: RPR Ser Ql: NONREACTIVE

## 2021-01-18 MED ORDER — SENNOSIDES-DOCUSATE SODIUM 8.6-50 MG PO TABS
2.0000 | ORAL_TABLET | ORAL | Status: DC
  Administered 2021-01-18: 2 via ORAL
  Filled 2021-01-18: qty 2

## 2021-01-18 MED ORDER — DOCUSATE SODIUM 100 MG PO CAPS
100.0000 mg | ORAL_CAPSULE | Freq: Two times a day (BID) | ORAL | Status: DC
  Administered 2021-01-18 – 2021-01-19 (×2): 100 mg via ORAL
  Filled 2021-01-18 (×2): qty 1

## 2021-01-18 MED ORDER — TETANUS-DIPHTH-ACELL PERTUSSIS 5-2.5-18.5 LF-MCG/0.5 IM SUSY: 0.5000 mL | PREFILLED_SYRINGE | Freq: Once | INTRAMUSCULAR | Status: DC

## 2021-01-18 MED ORDER — TRANEXAMIC ACID-NACL 1000-0.7 MG/100ML-% IV SOLN
INTRAVENOUS | Status: AC
  Administered 2021-01-18: 1000 mg via INTRAVENOUS
  Filled 2021-01-18: qty 100

## 2021-01-18 MED ORDER — PRENATAL MULTIVITAMIN CH
1.0000 | ORAL_TABLET | Freq: Every day | ORAL | Status: DC
  Administered 2021-01-18 – 2021-01-19 (×2): 1 via ORAL
  Filled 2021-01-18 (×2): qty 1

## 2021-01-18 MED ORDER — BENZOCAINE-MENTHOL 20-0.5 % EX AERO: 1.0000 "application " | INHALATION_SPRAY | CUTANEOUS | Status: DC | PRN

## 2021-01-18 MED ORDER — MEASLES, MUMPS & RUBELLA VAC IJ SOLR: 0.5000 mL | Freq: Once | INTRAMUSCULAR | Status: DC

## 2021-01-18 MED ORDER — OXYCODONE HCL 5 MG PO TABS: 5.0000 mg | ORAL_TABLET | ORAL | Status: DC | PRN

## 2021-01-18 MED ORDER — FERROUS SULFATE 325 (65 FE) MG PO TABS
325.0000 mg | ORAL_TABLET | ORAL | Status: DC
  Administered 2021-01-18: 325 mg via ORAL
  Filled 2021-01-18: qty 1

## 2021-01-18 MED ORDER — METHYLERGONOVINE MALEATE 0.2 MG/ML IJ SOLN
INTRAMUSCULAR | Status: AC
  Filled 2021-01-18: qty 1

## 2021-01-18 MED ORDER — CEFAZOLIN SODIUM-DEXTROSE 2-4 GM/100ML-% IV SOLN
2.0000 g | INTRAVENOUS | Status: AC
  Administered 2021-01-18: 2 g via INTRAVENOUS
  Filled 2021-01-18: qty 100

## 2021-01-18 MED ORDER — SIMETHICONE 80 MG PO CHEW: 80.0000 mg | CHEWABLE_TABLET | ORAL | Status: DC | PRN

## 2021-01-18 MED ORDER — COCONUT OIL OIL: 1.0000 "application " | TOPICAL_OIL | Status: DC | PRN

## 2021-01-18 MED ORDER — DIBUCAINE (PERIANAL) 1 % EX OINT: 1.0000 "application " | TOPICAL_OINTMENT | CUTANEOUS | Status: DC | PRN

## 2021-01-18 MED ORDER — ONDANSETRON HCL 4 MG/2ML IJ SOLN: 4.0000 mg | INTRAMUSCULAR | Status: DC | PRN

## 2021-01-18 MED ORDER — WITCH HAZEL-GLYCERIN EX PADS: 1.0000 "application " | MEDICATED_PAD | CUTANEOUS | Status: DC | PRN

## 2021-01-18 MED ORDER — DIPHENHYDRAMINE HCL 25 MG PO CAPS: 25.0000 mg | ORAL_CAPSULE | Freq: Four times a day (QID) | ORAL | Status: DC | PRN

## 2021-01-18 MED ORDER — ONDANSETRON HCL 4 MG PO TABS: 4.0000 mg | ORAL_TABLET | ORAL | Status: DC | PRN

## 2021-01-18 MED ORDER — METHYLERGONOVINE MALEATE 0.2 MG/ML IJ SOLN
0.2000 mg | Freq: Once | INTRAMUSCULAR | Status: AC
  Administered 2021-01-18: 0.2 mg via INTRAMUSCULAR

## 2021-01-18 MED ORDER — TRANEXAMIC ACID-NACL 1000-0.7 MG/100ML-% IV SOLN: 1000.0000 mg | Freq: Once | INTRAVENOUS | Status: AC

## 2021-01-18 MED ORDER — ACETAMINOPHEN 325 MG PO TABS
650.0000 mg | ORAL_TABLET | ORAL | Status: DC | PRN
  Administered 2021-01-18: 650 mg via ORAL
  Filled 2021-01-18: qty 2

## 2021-01-18 MED ORDER — IBUPROFEN 600 MG PO TABS
600.0000 mg | ORAL_TABLET | Freq: Four times a day (QID) | ORAL | Status: DC
  Administered 2021-01-18 – 2021-01-19 (×6): 600 mg via ORAL
  Filled 2021-01-18 (×6): qty 1

## 2021-01-18 MED ORDER — OXYCODONE HCL 5 MG PO TABS: 10.0000 mg | ORAL_TABLET | ORAL | Status: DC | PRN

## 2021-01-18 NOTE — Plan of Care (Signed)

## 2021-01-18 NOTE — Lactation Note (Signed)
This note was copied from a baby's chart. Lactation Consultation Note  Patient Name: Erin Jacobs YMEBR'A Date: 01/18/2021 Reason for consult: Follow-up assessment;Term;Infant < 6lbs Age:27 hours - feeding preference - breast / formula ,  Supplementing has already been started.  Per mom have attempted to breast feed / no milk yet.  LC explained the benefits of STS feedings and giving the baby the opportunity to  Work on latching, supply and demand x 3 22, 15 and 3 ml. Attempts to latch. Prior to assisting mom to latch baby had a wet and a stool ( green / mec ).  LC offered to assist to latch and mom receptive/ right breast / football / and work on depth and showing mom how to work with baby to open wide prior to latch. Initially per mom some discomfort until LC eased down the chin and per mom more comfortable / swallows noted/ baby released after 7 mins.  LC noted areola edema/ and provided shells for between feedings except when sleeping.  Per mom mentioned WIC - [place] told her she couldn't get a DEBP until she was going back to work.     Maternal Data Has patient been taught Hand Expression?: Yes  Feeding Mother's Current Feeding Choice: Breast Milk and Formula Nipple Type: Slow - flow  LATCH Score Latch: Grasps breast easily, tongue down, lips flanged, rhythmical sucking.  Audible Swallowing: A few with stimulation  Type of Nipple: Everted at rest and after stimulation  Comfort (Breast/Nipple): Soft / non-tender  Hold (Positioning): Assistance needed to correctly position infant at breast and maintain latch.  LATCH Score: 8   Lactation Tools Discussed/Used Tools: Shells  Interventions Interventions: Breast feeding basics reviewed;Assisted with latch;Skin to skin;Breast massage;Hand express;Reverse pressure;Breast compression;Adjust position;Support pillows;Position options;Shells;Education  Discharge [place] Program: Yes  Consult Status Consult Status:  Follow-up Date: 01/18/21 Follow-up type: In-patient    [place] 01/18/2021, 1:32 PM

## 2021-01-18 NOTE — Discharge Summary (Addendum)
Postpartum Discharge Summary    Patient Name: Erin Jacobs DOB: Dec 13, 1995 MRN: [id number]  Date of admission: 01/17/2021 Delivery date:01/18/2021  Delivering provider: Janet Berlin  Date of discharge: 01/19/2021  Admitting diagnosis: Post-dates pregnancy [O48.0] Intrauterine pregnancy: [redacted]w[redacted]d    Secondary diagnosis:  Active Problems:   Maternal varicella, non-immune   Anemia in pregnancy   Supervision of other normal pregnancy, antepartum   Post term pregnancy over 40 weeks   Vaginal delivery  Additional problems: none    Discharge diagnosis: Term Pregnancy Delivered                                              Post partum procedures: nexplanon Augmentation: AROM and Pitocin Complications: None  Hospital course: Induction of Labor With Vaginal Delivery   25y.o. yo [id number] admitted to the hospital 01/17/2021 for induction of labor.  Indication for induction: Postdates.  Patient had an uncomplicated labor course as follows: Membrane Rupture Time/Date: 8:54 PM ,01/17/2021   Delivery Method:Vaginal, Spontaneous  Episiotomy: None  Lacerations:  None  Details of delivery can be found in separate delivery note.  Patient had a routine postpartum course. Patient is discharged home 01/19/21.  Newborn Data: Birth date:01/18/2021  Birth time:1:29 AM  Gender:Female  Living status:Living  Apgars:9 ,9  Weight:2705 g   Magnesium Sulfate received: No BMZ received: No Rhophylac:N/A MMR:N/A T-DaP:Given prenatally Flu: No Transfusion:No  Physical exam  Vitals:   01/18/21 1329 01/18/21 1720 01/18/21 1958 01/19/21 0500  BP: 103/69 110/70 (!) 116/59 116/74  Pulse: 86 90 92 76  Resp: 20 18 18 16   Temp: 98.2 F (36.8 C) 98.2 F (36.8 C) 98 F (36.7 C) 98 F (36.7 C)  TempSrc: Oral Oral Oral Oral  SpO2: 100% 100% 100%    General: alert, cooperative, and no distress Lochia: appropriate Uterine Fundus: firm Incision: N/A DVT Evaluation: No evidence of DVT seen on  physical exam. No cords or calf tenderness. Labs: Lab Results  Component Value Date   WBC 7.4 01/17/2021   HGB 9.6 (L) 01/17/2021   HCT 33.4 (L) 01/17/2021   MCV 78.2 (L) 01/17/2021   PLT 333 01/17/2021   CMP Latest Ref Rng & Units 10/15/2020  Glucose 65 - 99 mg/dL 74  BUN 6 - 20 mg/dL 4(L)  Creatinine 0.57 - 1.00 mg/dL 0.68  Sodium 134 - 144 mmol/L 138  Potassium 3.5 - 5.2 mmol/L 3.9  Chloride 96 - 106 mmol/L 105  CO2 20 - 29 mmol/L 18(L)  Calcium 8.7 - 10.2 mg/dL 8.9  Total Protein 6.0 - 8.5 g/dL 6.4  Total Bilirubin 0.0 - 1.2 mg/dL 0.2  Alkaline Phos 44 - 121 IU/L 81  AST 0 - 40 IU/L 11  ALT 0 - 32 IU/L 6   Edinburgh Score: Edinburgh Postnatal Depression Scale Screening Tool 01/19/2021  I have been able to laugh and see the funny side of things. 0  I have looked forward with enjoyment to things. 0  I have blamed myself unnecessarily when things went wrong. 0  I have been anxious or worried for no good reason. 0  I have felt scared or panicky for no good reason. 0  Things have been getting on top of me. 0  I have been so unhappy that I have had difficulty sleeping. 0  I have felt  sad or miserable. 0  I have been so unhappy that I have been crying. 0  The thought of harming myself has occurred to me. 0  Edinburgh Postnatal Depression Scale Total 0     After visit meds:  Allergies as of 01/19/2021   No Known Allergies      Medication List     TAKE these medications    acetaminophen 325 MG tablet Commonly known as: Tylenol Take 3 tablets (975 mg total) by mouth every 6 (six) hours as needed (for pain scale < 4).   ferrous sulfate 325 (65 FE) MG tablet Take 1 tablet (325 mg total) by mouth every other day. Start taking on: January 20, 2021   ibuprofen 600 MG tablet Commonly known as: ADVIL Take 1 tablet (600 mg total) by mouth every 6 (six) hours.   PrePLUS 27-1 MG Tabs Take 1 tablet by mouth daily.         Discharge home in stable condition Infant  Feeding: Breast Infant Disposition:home with mother Discharge instruction: per After Visit Summary and Postpartum booklet. Activity: Advance as tolerated. Pelvic rest for 6 weeks.  Diet: routine diet Future Appointments: Future Appointments  Date Time Provider [place]  02/23/2021  1:30 PM Nugent, Gerrie Nordmann, NP [place] None   Follow up Visit:   Please schedule this patient for a In person postpartum visit in 6 weeks with the following provider: Any provider. Additional Postpartum F/U:nexplanon  Low risk pregnancy complicated by:  none Delivery mode:  Vaginal, Spontaneous  Anticipated Birth Control:  Nexplanon   01/19/2021 Maury Dus, MD  Midwife attestation I have seen and examined this patient and agree with above documentation in the resident's note.   LARIYA KINZIE is a 25 y.o. [id number] s/p NSVD.   Pain is well controlled.  Plan for birth control is  Nexplanon placed inpatient .  Method of Feeding: bottle  PE:  BP 116/74 (BP Location: Right Arm)   Pulse 76   Temp 98 F (36.7 C) (Oral)   Resp 16   LMP 04/06/2020 (Approximate)   SpO2 100%   Breastfeeding Unknown  Gen: well appearing Heart: reg rate Lungs: normal WOB Fundus firm Ext: soft, no pain, no edema  Recent Labs    01/17/21 1502  HGB 9.6*  HCT 33.4*     Plan: discharge today - postpartum care discussed - f/u clinic in 6 weeks for postpartum visit   Fatima Blank, CNM 4:43 PM

## 2021-01-18 NOTE — Lactation Note (Addendum)
This note was copied from a baby's chart. Lactation Consultation Note  Patient Name: Erin Jacobs XBWIO'M Date: 01/18/2021 Reason for consult: Follow-up assessment;Mother's request;Difficult latch;Term;Infant < 6lbs Age:27 hours  Infant showing cues on arrival. LC did some suck training noting infant high palate, thick labial attachment and tends to tuck in bottom lip. We worked on Tourist information centre manager out bottom and top lips to ensure latch not pitching getting her deeper on the breasts.  Mom stated with changes made pain decreased from 6 to 2 since had previous soreness from shallow latches.  Mom given coconut oil for nipple care. Mom provided with comfort gels to place in fridge use when cold and rinse in between use to discard after 6 days. Mom aware to not use coconut oil with comfort gels.   Plan 1. To feed based on cues 8-12x in 24hr period no more than 3 hrs without an attempt. Mom to offer both breasts and look for signs of milk transfer.  2. Mom to pace bottle feed with  extra slow flow nipple EBM first followed by formula based on LPTI infant supplementation guidelines.  3. Mom to pump on DEBP q 3hrs for 7min All questions answered at the end of the visit.   Maternal Data Has patient been taught Hand Expression?: Yes  Feeding Mother's Current Feeding Choice: Breast Milk and Formula  LATCH Score Latch: Repeated attempts needed to sustain latch, nipple held in mouth throughout feeding, stimulation needed to elicit sucking reflex.  Audible Swallowing: A few with stimulation  Type of Nipple: Everted at rest and after stimulation  Comfort (Breast/Nipple): Soft / non-tender (Mom uncomfortable with strong tug she is feeling)  Hold (Positioning): Assistance needed to correctly position infant at breast and maintain latch.  LATCH Score: 7   Lactation Tools Discussed/Used Tools: Flanges;Pump;Coconut oil;Comfort gels Flange Size: 24 Breast pump type: Double-Electric Breast  Pump Pump Education: Setup, frequency, and cleaning;Milk Storage Reason for Pumping: increase stimulation Pumping frequency: every 3 hrs for 15 min  Interventions Interventions: Breast feeding basics reviewed;Breast compression;Assisted with latch;Adjust position;Skin to skin;Support pillows;DEBP;Breast massage;Position options;Hand express;Expressed milk;Education;Coconut oil;Comfort gels  Discharge    Consult Status Consult Status: Follow-up Date: 01/19/21 Follow-up type: In-patient    Arrie Borrelli  Nicholson-Springer 01/18/2021, 9:21 PM

## 2021-01-18 NOTE — Lactation Note (Signed)
This note was copied from a baby's chart. Lactation Consultation Note Saw mom right before turned hr old. Mom fixing to try to latch baby. Mom has short shaft compressible nipple. Breast are tender. When baby latches mom states it hurts. Mom states baby is suckling hard. LC tries to pull chin down to widen flange. Baby is off and on frequently. Mom getting frustrated because baby will not stay on and it is painful when she is on. Mom stated she sucks hard like her brother did that's why she stopped BF him. Mom kept asking if the baby was getting anything. So did FOB. LC explained about colostrum and mature milk and importance of I&O. Mom looking at nipple stating she doesn't think anything is coming out. LC tried hand expression saw a small dot. FOB wanted formula. Mom stated she is BF/formula feeding. Mom will be f/u on MBU.  Patient Name: Erin Jacobs HQION'G Date: 01/18/2021 Reason for consult: L&D Initial assessment;Term Age:7 hours  Maternal Data    Feeding    LATCH Score Latch: Repeated attempts needed to sustain latch, nipple held in mouth throughout feeding, stimulation needed to elicit sucking reflex.  Audible Swallowing: None  Type of Nipple: Everted at rest and after stimulation  Comfort (Breast/Nipple): Soft / non-tender  Hold (Positioning): Assistance needed to correctly position infant at breast and maintain latch.  LATCH Score: 6   Lactation Tools Discussed/Used    Interventions Interventions: Support pillows;Assisted with latch;Position options;Skin to skin;Adjust position  Discharge    Consult Status Consult Status: Follow-up Date: 01/18/21 Follow-up type: In-patient    Erin Jacobs, Elta Guadeloupe 01/18/2021, 3:01 AM

## 2021-01-18 NOTE — Social Work (Signed)
CSW received consult for hx of Anxiety. 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  CSW met with MOB to offer support and complete assessment.     CSW introduced self and role. CSW observed MOB holding newborn '[place].' CSW informed MOB of reason for consult and assessed current mood. MOB was pleasant and forthcoming with CSW. MOB reported she is tired, but otherwise doing well. MOB disclosed she has a history of anxiety and PPD following the birth of her son in 2019. MOB shared the symptoms presented as a lot of crying, being emotions and yelling. MOB stated it lasted until infant was about 9 months old. CSW asked MOB how she coped during that time. MOB reported she stayed to herself and utilized self care. MOB stated FOB was also very helpful and assisted with the children during that time. MOB was never prescribed medication and never attended therapy to cope with the symptoms. MOB shared she feels comfortable seeking professional help if needs arise postpartum and was receptive to resources provided. MOB identified FOB, her mother and five sisters as primary supports. MOB denies any current SI, HI or being involved in DV. MOB denied any current mental health concerns.  CSW provided education regarding the baby blues period versus PPD and provided resources. CSW provided the New Mom Checklist and encouraged MOB to self evaluate and contact a medical professional if symptoms are noted at any time.   CSW provided review of Sudden Infant Death Syndrome (SIDS) precautions.  MOB reported she has all essentials for infant, including a crib and car seat. MOB denies barriers to follow-up care. MOB reported she has no additional needs at this time.  CSW identifies no further need for intervention and no barriers to discharge at this time.  Ngozi Alvidrez, LCSWA Clinical Social Work [facility] [phone]  

## 2021-01-18 NOTE — Progress Notes (Signed)
Labor Progress Note Erin Jacobs is a 25 y.o. [phone] at [redacted]w[redacted]d presented for IOL for DFM, PD S: Contractions are stronger  O:  BP 125/64   Pulse 93   Temp 98.4 F (36.9 C) (Axillary)   Resp 16   LMP 04/06/2020 (Approximate)   SpO2 100%  EFM: baseline 135/mod to min variability/pos accels/no decels   CVE: Dilation: 6 Effacement (%): 70 Station: -2 Presentation: Vertex Exam by:: Dr. Berniece Andreas   A&P: 25 y.o. [id number] [redacted]w[redacted]d here for IOL for PD, DFM #Labor: Progressing well. Pitocin started at 1610, now at 50. AROM at 2100 for clear fluid. Anticipate SVD. #Pain: desires unmedicated #FWB: cat I  #GBS negative   Janet Berlin, MD 1:15 AM

## 2021-01-19 DIAGNOSIS — Z30017 Encounter for initial prescription of implantable subdermal contraceptive: Secondary | ICD-10-CM

## 2021-01-19 MED ORDER — ACETAMINOPHEN 325 MG PO TABS: 1000.0000 mg | ORAL_TABLET | Freq: Four times a day (QID) | ORAL | Status: DC | PRN

## 2021-01-19 MED ORDER — IBUPROFEN 600 MG PO TABS: 600.0000 mg | ORAL_TABLET | Freq: Four times a day (QID) | ORAL | 0 refills | Status: DC

## 2021-01-19 MED ORDER — OXYCODONE HCL 5 MG PO TABS: 5.0000 mg | ORAL_TABLET | Freq: Once | ORAL | Status: DC

## 2021-01-19 MED ORDER — ETONOGESTREL 68 MG ~~LOC~~ IMPL
68.0000 mg | DRUG_IMPLANT | Freq: Once | SUBCUTANEOUS | Status: AC
  Administered 2021-01-19: 68 mg via SUBCUTANEOUS
  Filled 2021-01-19: qty 1

## 2021-01-19 MED ORDER — FERROUS SULFATE 325 (65 FE) MG PO TABS: 325.0000 mg | ORAL_TABLET | ORAL | 3 refills | Status: DC

## 2021-01-19 MED ORDER — LIDOCAINE HCL 1 % IJ SOLN
0.0000 mL | Freq: Once | INTRAMUSCULAR | Status: AC | PRN
  Administered 2021-01-19: 20 mL via INTRADERMAL
  Filled 2021-01-19: qty 20

## 2021-01-19 NOTE — Lactation Note (Signed)
This note was copied from a baby's chart. Lactation Consultation Note  Patient Name: Erin Jacobs FQHKU'V Date: 01/19/2021 Reason for consult: Follow-up assessment Age:27 hours  Mother states she wants to no longer breastfeed and will pump and bottle feed when her milk supply increases and for now she is formula feeding. Mother has not been pumping on a regular basis.  Encouraged pumping at least 8 times per day. Reviewed engorgement care.   Feeding Mother's Current Feeding Choice: Breast Milk and Formula Nipple Type: Slow - flow    Lactation Tools Discussed/Used Tools: Pump Flange Size: 27 Breast pump type: Double-Electric Breast Pump;Manual  Interventions Interventions: DEBP;Education;Hand pump  Discharge Discharge Education: Engorgement and breast care Pump: Manual  Consult Status Consult Status: Complete Date: 01/19/21    Vivianne Master [facility] 01/19/2021, 9:04 AM

## 2021-01-19 NOTE — Procedures (Addendum)
Post-Placental Nexplanon Insertion Procedure Note  Patient was identified. Informed consent was signed, signed copy in chart. A time-out was performed.    The insertion site was identified 8-10 cm (3-4 inches) from the medial epicondyle of the humerus and 3-5 cm (1.25-2 inches) posterior to (below) the sulcus (groove) between the biceps and triceps muscles of the patient's left arm and marked. The site was prepped in the usual sterile fashion. Pt was prepped with alcohol swab and then injected with 10 cc of 1 % lidocaine.  The site was prepped with betadine. Nexplanon removed form packaging,  Device confirmed in needle, then inserted full length of needle and withdrawn per handbook instructions. Provider and patient verified presence of the implant in the woman's arm by palpation. Pt insertion site was covered with gauze and pressure bandage. There was minimal blood loss. Patient tolerated procedure well.  Patient was given post procedure instructions and Nexplanon user card with expiration date. Condoms were recommended for STI prevention. Patient was asked to keep the pressure dressing on for 24 hours to minimize bruising and keep the adhesive bandage on for 3-5 days. The patient verbalized understanding of the plan of care and agrees.   Lot # [id number] Expiration Date: 98/26/41  Arrie Senate, MD OB Fellow, [place] [facility] [place] 01/19/2021 12:53 PM

## 2021-01-20 LAB — BIRTH TISSUE RECOVERY COLLECTION (PLACENTA DONATION)

## 2021-01-31 ENCOUNTER — Telehealth (HOSPITAL_COMMUNITY): Payer: Self-pay | Admitting: *Deleted

## 2021-01-31 NOTE — Telephone Encounter (Signed)
Left message to return nurse call.  Odis Hollingshead, RN 01/31/2021 at 11:40am

## 2021-02-23 ENCOUNTER — Other Ambulatory Visit: Payer: Self-pay

## 2021-02-23 ENCOUNTER — Ambulatory Visit (INDEPENDENT_AMBULATORY_CARE_PROVIDER_SITE_OTHER): Payer: Medicaid Other | Admitting: Women's Health

## 2021-02-23 ENCOUNTER — Encounter: Payer: Self-pay | Admitting: Women's Health

## 2021-02-23 NOTE — Progress Notes (Signed)
[place] Partum Visit Note  Erin Jacobs is a 25 y.o. [id number] female who presents for a postpartum visit. She is 5 weeks postpartum following a normal spontaneous vaginal delivery.  I have fully reviewed the prenatal and intrapartum course. The delivery was at 11 gestational weeks.  Anesthesia: IV sedation. Postpartum course has been uncomplicated. Baby is doing well. Baby is feeding by both breast and bottle - Enfamil AR. Bleeding staining only. Bowel function is normal. Bladder function is normal. Patient is not sexually active. Contraception method is Nexplanon. Postpartum depression screening: negative.   The pregnancy intention screening data noted above was reviewed. Potential methods of contraception were discussed. The patient elected to proceed with No data recorded.   Edinburgh Postnatal Depression Scale - 02/23/21 1345       Edinburgh Postnatal Depression Scale:  In the Past 7 Days   I have been able to laugh and see the funny side of things. 0    I have looked forward with enjoyment to things. 0    I have blamed myself unnecessarily when things went wrong. 0    I have been anxious or worried for no good reason. 0    I have felt scared or panicky for no good reason. 0    Things have been getting on top of me. 0    I have been so unhappy that I have had difficulty sleeping. 0    I have felt sad or miserable. 0    I have been so unhappy that I have been crying. 0    The thought of harming myself has occurred to me. 0    Edinburgh Postnatal Depression Scale Total 0             Health Maintenance Due  Topic Date Due   COVID-19 Vaccine (1) Never done   HPV VACCINES (1 - 2-dose series) Never done   INFLUENZA VACCINE  02/14/2021    The following portions of the patient's history were reviewed and updated as appropriate: allergies, current medications, past family history, past medical history, past social history, past surgical history, and problem list.  Review of  Systems Pertinent items noted in HPI and remainder of comprehensive ROS otherwise negative.  Objective:  BP 127/81   Wt 217 lb (98.4 kg)   BMI 33.99 kg/m    General:  alert, cooperative, and no distress   Breasts:  normal  Lungs: clear to auscultation bilaterally  Heart:  regular rate and rhythm, S1, S2 normal, no murmur, click, rub or gallop  Abdomen: soft, non-tender; bowel sounds normal; no masses,  no organomegaly   Wound N/a. Nexplanon palpated in appropriate position.  GU exam:  not indicated       Assessment:   1. Postpartum exam  Normal postpartum exam.   Plan:   Essential components of care per ACOG recommendations:  1.  Mood and well being: Patient with negative depression screening today. Reviewed local resources for support.  - Patient tobacco use? No.   - hx of drug use? No.    2. Infant care and feeding:  -Patient currently breastmilk feeding? Yes. Discussed returning to work and pumping. Reviewed importance of draining breast regularly to support lactation. Patient needs a work note. Patient was provided letter for work to allow for every 2-3 hr pumping breaks, and to be granted a private location to express breastmilk and refrigerated area to store breastmilk.  -Social determinants of health (SDOH) reviewed in EPIC.  3.  Sexuality, contraception and birth spacing - Patient does not want a pregnancy in the next year.  Desired family size is 4 children.  - Reviewed forms of contraception in tiered fashion. Patient desired  Nexplanon  today.  Was inserted at hospital. - Discussed birth spacing of 18 months  4. Sleep and fatigue -Encouraged family/partner/community support of 4 hrs of uninterrupted sleep to help with mood and fatigue  5. Physical Recovery  - Discussed patients delivery and complications. She describes her labor as good. - Patient had a  boggy uterus with suspected retained membranes . Patient had a  no  laceration. Perineal healing reviewed.  Patient expressed understanding - Patient has urinary incontinence? No. - Patient is safe to resume physical and sexual activity  6.  Health Maintenance - HM due items addressed Yes - Last pap smear  Diagnosis  Date Value Ref Range Status  06/28/2020   Final   - Negative for intraepithelial lesion or malignancy (NILM)   Pap smear not done at today's visit.  -Breast Cancer screening indicated? No.   7. Chronic Disease/Pregnancy Condition follow up: None  - PCP follow up, list given  Clarisa Fling, NP [facility] [employer], [place]

## 2021-02-23 NOTE — Patient Instructions (Signed)
AREA FAMILY PRACTICE PHYSICIANS  [place] ([address]) [facility] [address] [place], [place] [address] [phone] Mon-Fri 8:30-12:30, 1:30-5:00 Accepting Medicaid [facility] [address], [place], [place] [address] [phone] Mon-Fri 8:00-5:30 [facility] [address] [place], [place] [address] [phone] Mon, Tue, Thur, Fri 8:30-5:00, Wed 10:00-7:00 (closed 1-2pm) Accepting Medicaid [facility] [address], [place], [place]  [address] Phone - [phone]   Fax - [phone]  [place] ([address]) [facility] [address] [place], [place] [address] [phone] Mon-Fri 8:00-5:00 [facility] ([facility])  [address] [place], [place] [address] [phone] Mon-Fri 8:30-5:30, Sat (Oct.-Mar.) 9:00-1:00 Accepting Medicaid  [place] ([address]) [facility] [address], [place], [place] [address] [phone] Mon-Fri 8:00-5:00  [place] ([address]) [facility] [address] [place] [address], [place], [place] [address] [phone] Mon-Fri 8:00-5:00 [facility] [address], [place], [place] [address] [phone] Mon-Fri 8:00-5:00 [facility] [address] [place], [place] [address] [phone] Mon-Fri 8:00-5:00 [facility] [address] [place] [address] [place] [address] [phone] Mon-Fri 7:30-5:30  [place] ([address] & [address]) [facility] [address] [place], [place] [address] [phone] Mon-Thur 8:00-6:00 Accepting Medicaid [facility] [address] [place], [place] [address] [phone] Mon-Thur 7:30-7:30, Fri 7:30-4:30 Accepting  Medicaid [facility] [address], [place], [place]  [address] [phone]   Fax - [phone]  [place]  ([address] & [address]) [facility] [address] , [place] [address] [phone] Mon-Fri 7:00-5:00 [facility] [address], [place], [place] [address] [phone] Mon-Fri 8:00-5:00 Accepting Medicaid [facility] [address], , [place] [address] [phone] Mon-Fri 8:00-5:00 Accepting Medicaid  [place] ([address]) [facility] [place] [address] [place], [place] [address] [phone] Mon-Fri 8:00-5:00 [facility] [address], [place], [place] [address] [phone] Mon-Fri 8:00-5:00 Accepting Medicaid [facility] ([facility] [address], [place], [place] [address] [phone] Mon-Fri 8:00-5:30, Sat&Sun by appointment (phones open at 8:30) Accepting Medicaid  [place] ([address]) [facility] [address] [place], [place] [address] [phone] Mon-Thur 8:00-7:00, Fri 8:00-5:00, Sat 8:00-12:00, Sun 9:00-12:00 Accepting Medicaid [facility] [address], [place], [place] [address] [phone] Mon-Thur 8:00-5:00 Accepting Medicaid [facility] [address] [place], [place] [address] [phone] Mon-Fri 8:00-5:30, Sat (Oct.-Mar.) 9:00-1:00 Accepting Medicaid  [place] ([address]) [facility] [address], [place], [place] [address] [phone] Mon-Fri 8:00-5:00 Accepting Medicaid   [place] ([address]) [facility] [place] [address], [place], [place] [address] [phone] Mon-Fri 8:00-5:00 [facility] [place] [address], [place], [place] [address] [phone] Mon-Fri 8:00-5:00 [facility] [place] [address] [place] [address], [place], [place] [address] [phone] Mon-Fri 8:00-5:00 After hours clinic ([address] [place], [place] [address]) [phone] Mon-Fri 5:00-8:00, Sat 12:00-6:00, Sun 10:00-4:00 Accepting Medicaid [facility] [place]   [address], [place], [place]  [address] [phone]   Fax - [phone]  [place] ([address]) [facility] [place] [facility] [address], [place], [place] [address] [phone] Mon-Fri 8:00-5:00 [facility] [place] ([facility] [place]) [address], [place], [place] [address] [phone] Mon-Thur 8:00-7:00, Fri 8:00-5:00, Sat 8:00-12:00    

## 2021-06-11 IMAGING — US US MFM OB COMP +14 WKS
1 series · 13 of 28 positions shown · non-contrast
Comparison: none

[Series 1: us mfm ob comp +14 wks · 106 acquisitions, 13 frames shown]
[im 4/106]
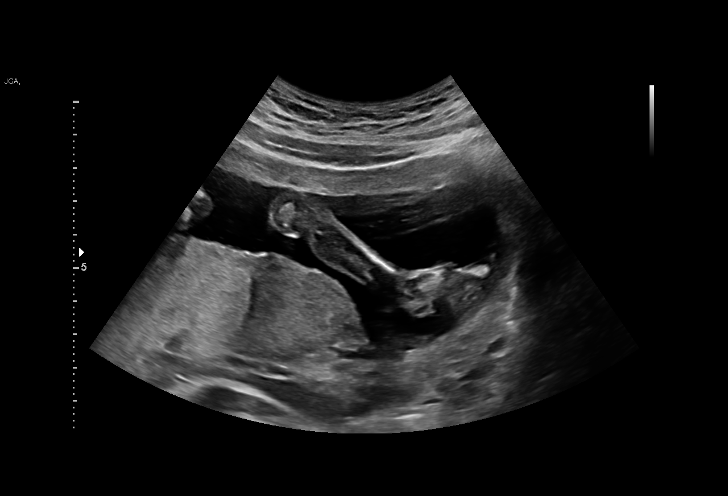
[im 12/106]
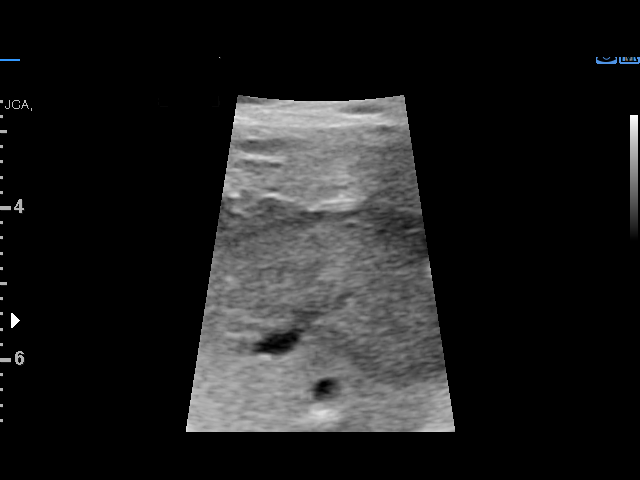
[im 20/106]
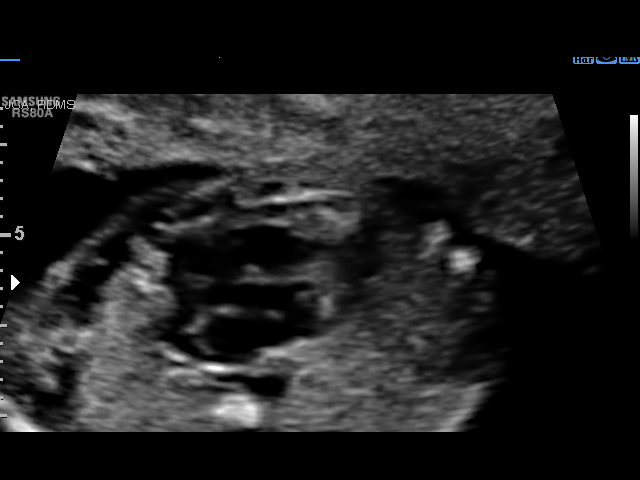
[im 28/106]
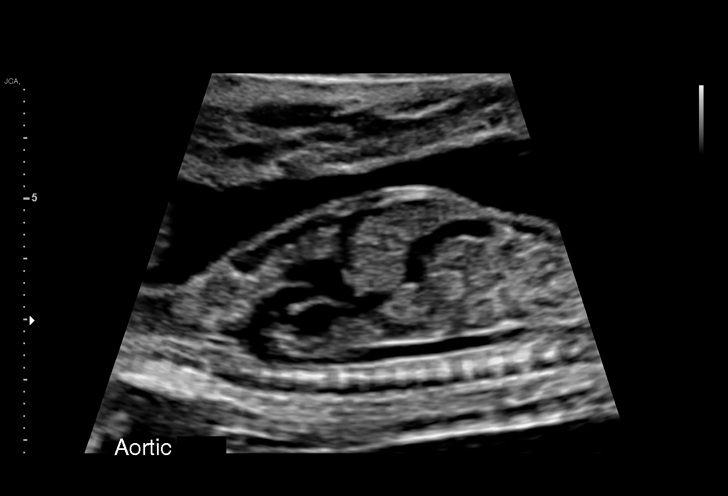
[im 36/106]
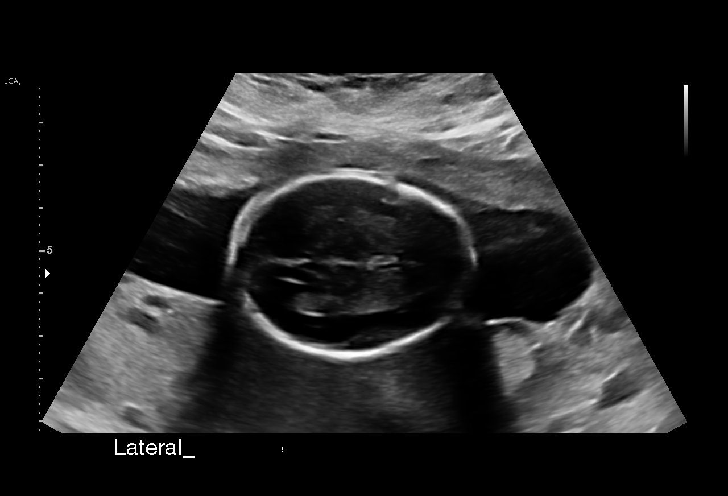
[im 43/106]
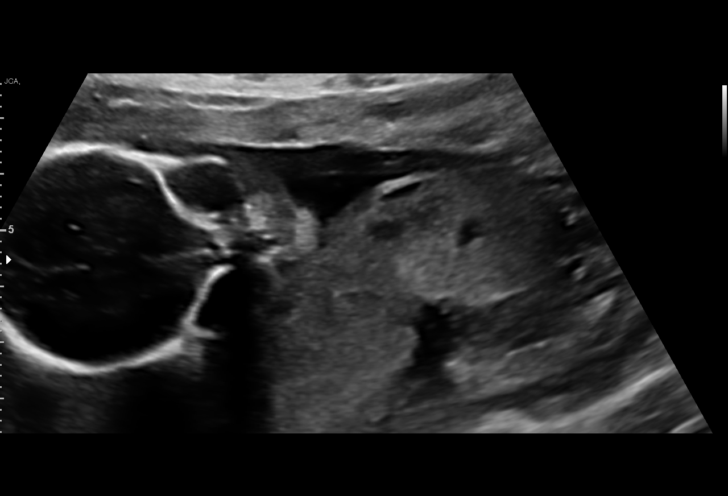
[im 55/106]
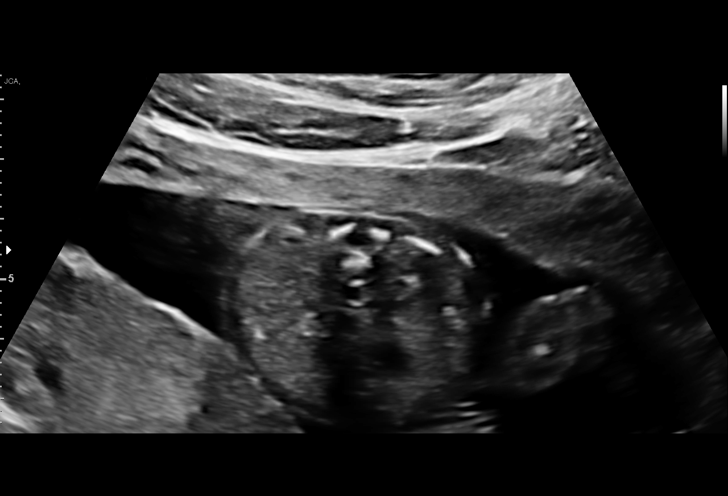
[im 63/106]
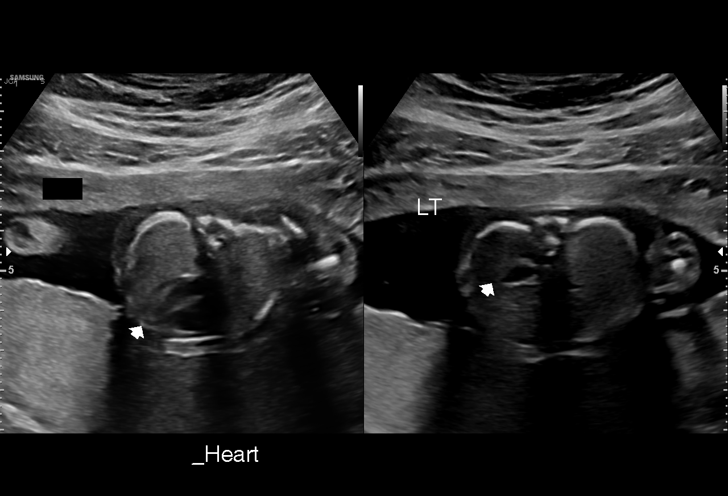
[im 71/106]
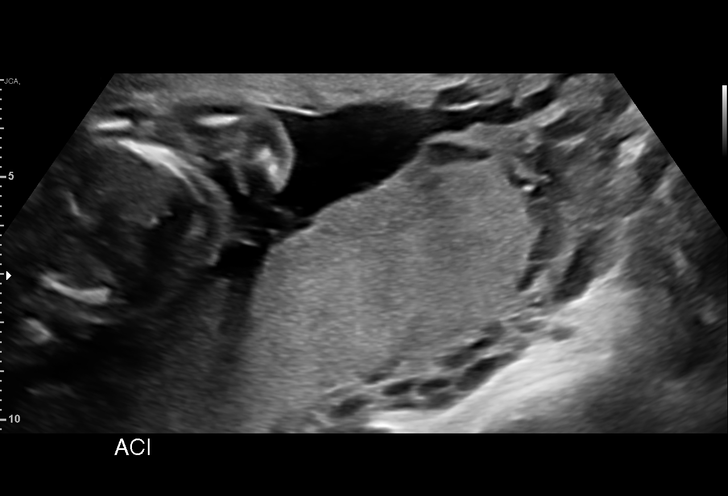
[im 78/106]
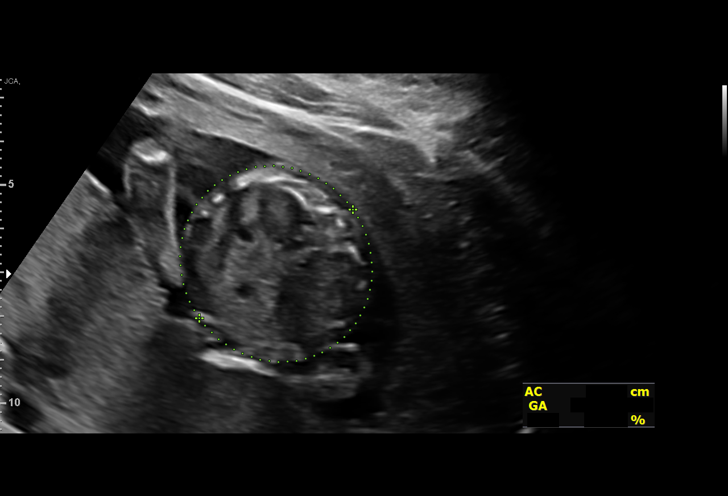
[im 86/106]
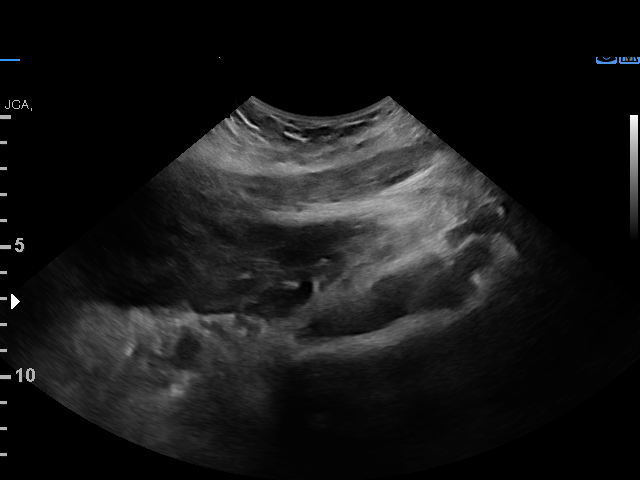
[im 94/106]
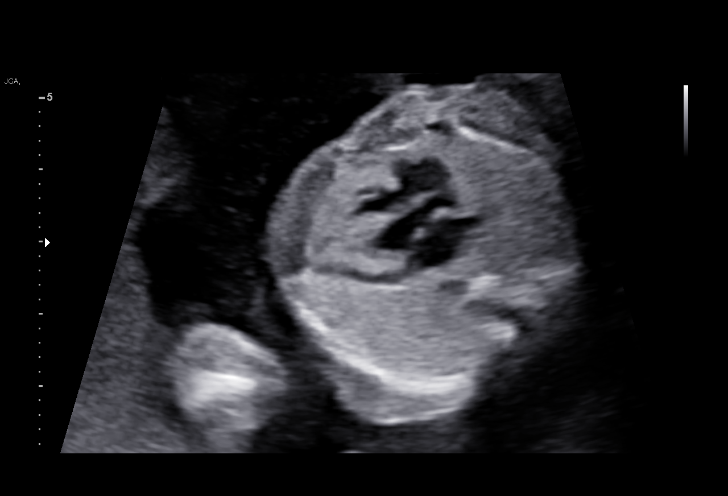
[im 102/106]
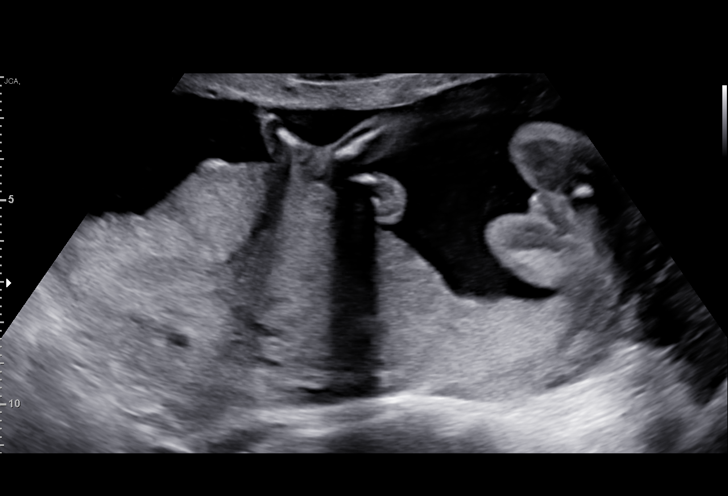

[13 of 28 positions shown; findings below may reference images not displayed]

[id number]

Indications

 Fetal abnormality - other known or suspected
 19 weeks gestation of pregnancy
 Encounter for antenatal screening for
 malformations
 Neg AFP, Neg Horizon and LR Nips
Fetal Evaluation

 Num Of Fetuses:         1
 Fetal Heart Rate(bpm):  153
 Cardiac Activity:       Observed
 Presentation:           Breech
 Placenta:               Posterior
 P. Cord Insertion:      Visualized

 Amniotic Fluid
 AFI FV:      Within normal limits

                             Largest Pocket(cm)

Biometry
 BPD:      42.8  mm     G. Age:  19w 0d         16  %    CI:        66.86   %    70 - 86
                                                         FL/HC:      18.1   %    16.8 -
 HC:      167.7  mm     G. Age:  19w 3d         23  %    HC/AC:      1.19        1.09 -
 AC:      141.5  mm     G. Age:  19w 4d         33  %    FL/BPD:     70.8   %
 FL:       30.3  mm     G. Age:  19w 3d         25  %    FL/AC:      21.4   %    20 - 24
 HUM:      29.9  mm     G. Age:  19w 6d         52  %
 CER:      22.7  mm     G. Age:  21w 1d         96  %
 NFT:       6.0  mm

 LV:        5.1  mm
 CM:        5.7  mm

 Est. FW:     292  gm    0 lb 10 oz      23  %
OB History

 Gravidity:    5         Term:   3
 Living:       3
Gestational Age

 LMP:           19w 6d        Date:  04/06/20                 EDD:   01/11/21
 U/S Today:     19w 3d                                        EDD:   01/14/21
 Best:          19w 6d     Det. By:  LMP  (04/06/20)          EDD:   01/11/21
Anatomy

 Cranium:               Appears normal         Aortic Arch:            Appears normal
 Cavum:                 Appears normal         Ductal Arch:            Appears normal
 Ventricles:            Appears normal         Diaphragm:              Appears normal
 Choroid Plexus:        Appears normal         Stomach:                Appears normal, left
                                                                       sided
 Cerebellum:            Appears normal         Abdomen:                Appears normal
 Posterior Fossa:       Appears normal         Abdominal Wall:         Appears nml (cord
                                                                       insert, abd wall)
 Nuchal Fold:           Appears normal         Cord Vessels:           Appears normal (3
                                                                       vessel cord)
 Face:                  Appears normal         Kidneys:                Appear normal
                        (orbits and profile)
 Lips:                  Appears normal         Bladder:                Appears normal
 Thoracic:              Appears normal         Spine:                  Appears normal
 Heart:                 Appears normal         Upper Extremities:      Appears normal
                        (4CH, axis, and
                        situs)
 RVOT:                  Appears normal         Lower Extremities:      Appears normal
 LVOT:                  Appears normal

 Other:  Normal genaitalia. Fetus appears to be female. Nasal bone
         visualized. Technically difficult due to fetal position. IVC/SVC
         Visualized
Cervix Uterus Adnexa

 Cervix
 Length:           4.09  cm.
 Normal appearance by transabdominal scan.
 Right Ovary
 Within normal limits.

 Left Ovary
 Within normal limits.
Comments

 This patient was seen for a detailed fetal anatomy scan.
 She denies any significant past medical history and denies
 any problems in her current pregnancy.
 She had a cell free DNA test earlier in her pregnancy which
 indicated a low risk for trisomy 21, 18, and 13. A female fetus
 is predicted.
 She was informed that the fetal growth and amniotic fluid
 level were appropriate for her gestational age.
 There were no obvious fetal anomalies noted on today's
 ultrasound exam.
 The patient was informed that anomalies may be missed due
 to technical limitations. If the fetus is in a suboptimal position
 or maternal habitus is increased, visualization of the fetus in
 the maternal uterus may be impaired.
 Follow up as indicated.

## 2021-08-06 IMAGING — US US MFM OB FOLLOW-UP
1 series · 14 of 28 positions shown · non-contrast
Comparison: none

[Series 1: us mfm ob follow-up · 14 of 45 slices shown]
[im 2/45]
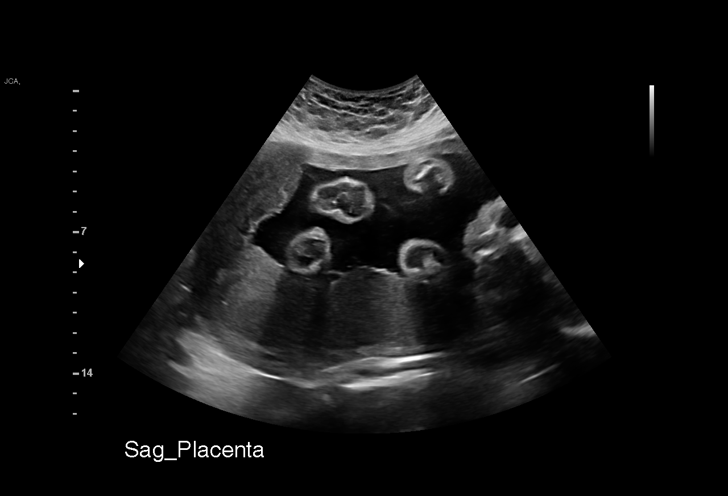
[im 5/45]
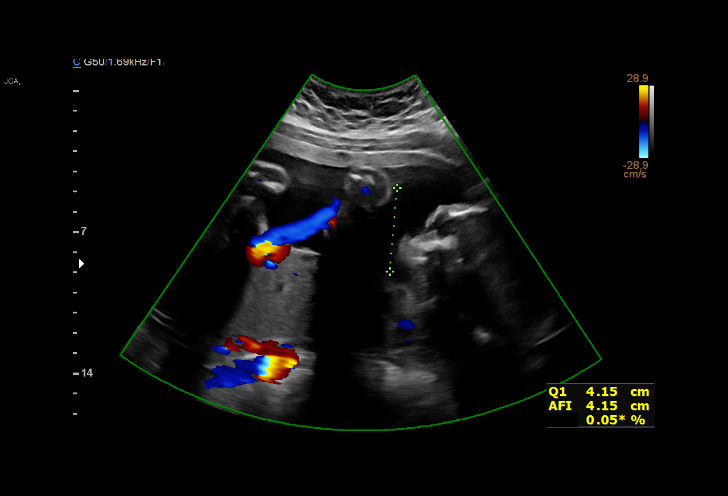
[im 9/45]
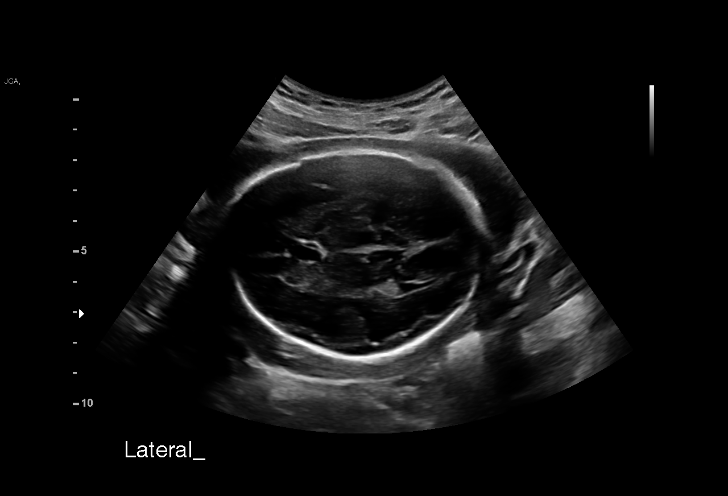
[im 12/45]
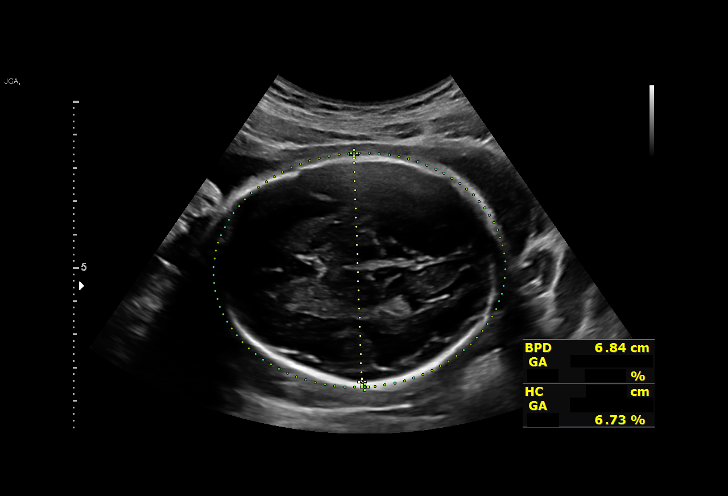
[im 15/45]
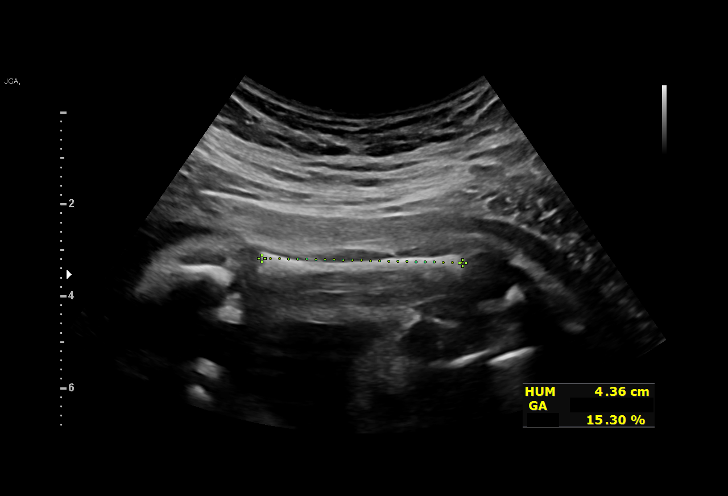
[im 18/45]
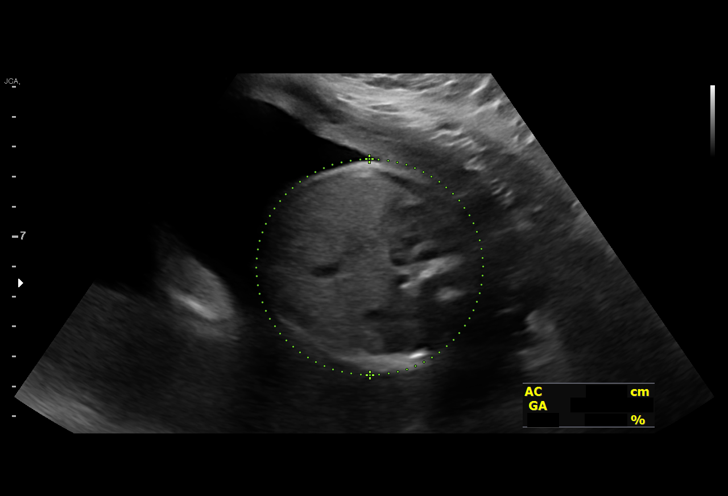
[im 22/45]
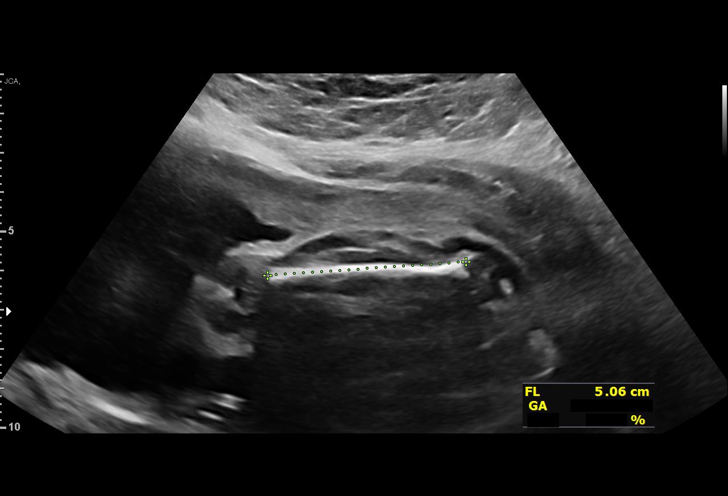
[im 25/45]
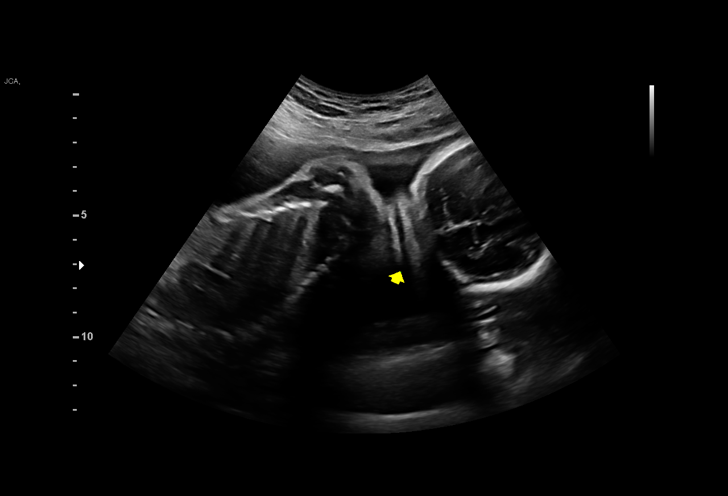
[im 28/45]
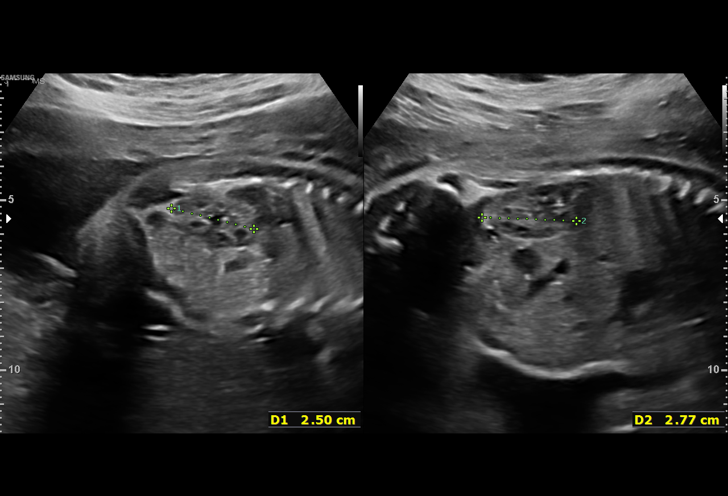
[im 31/45]
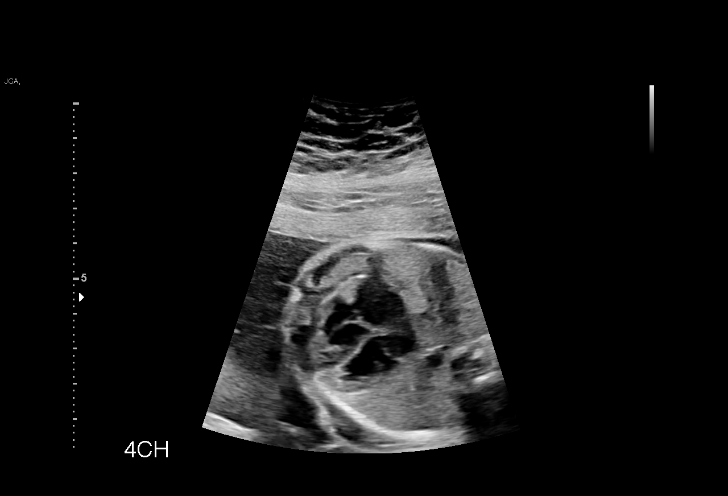
[im 35/45]
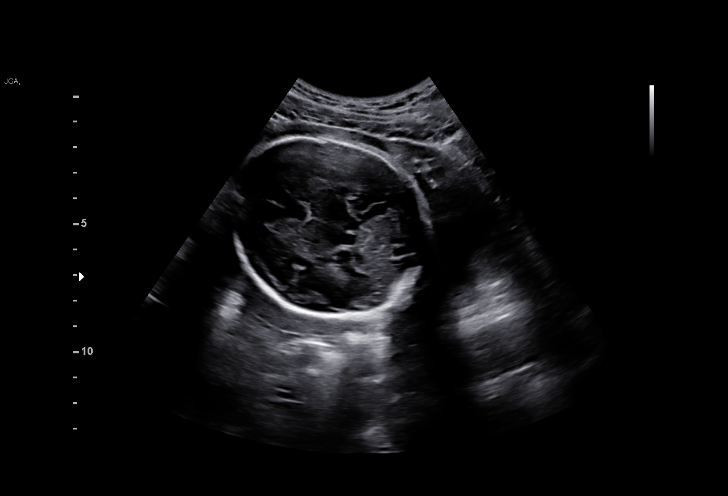
[im 38/45]
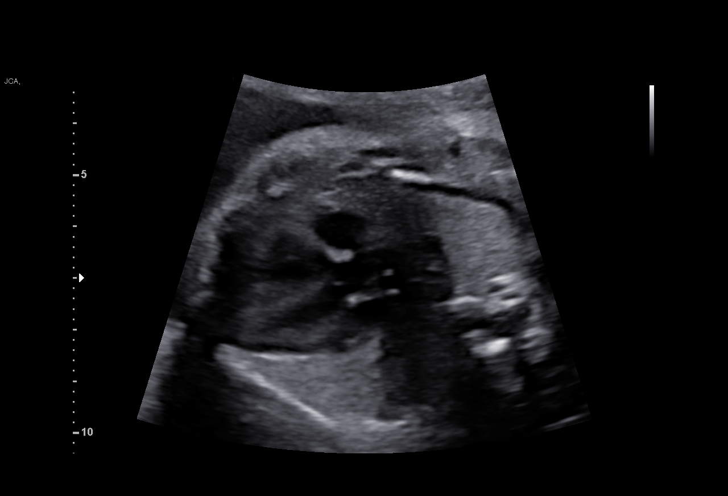
[im 41/45]
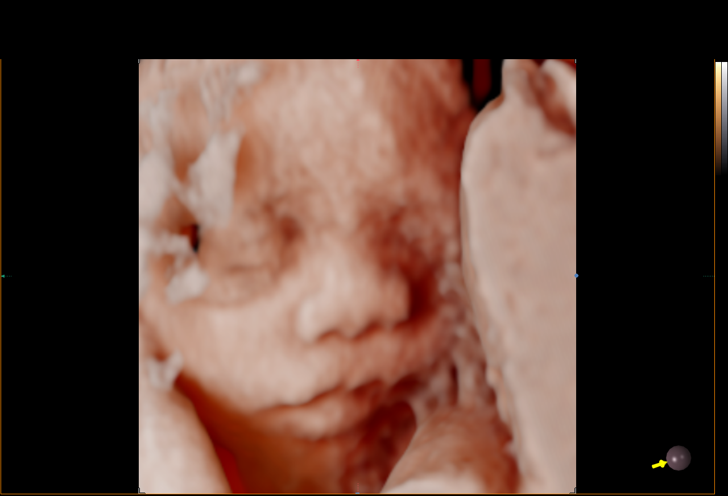
[im 45/45]
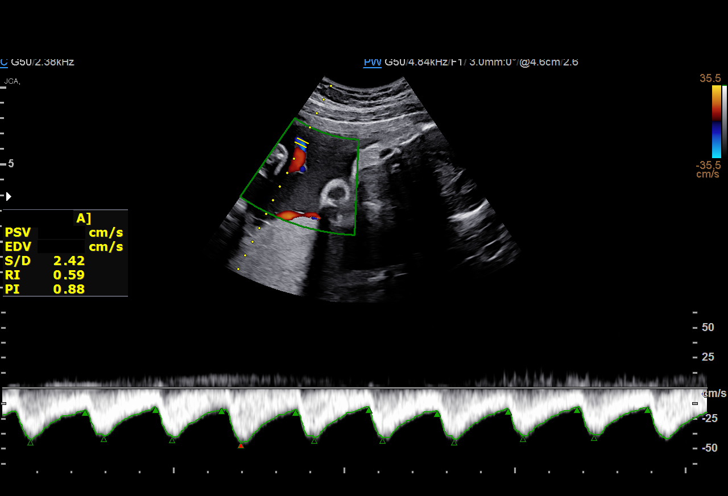

[14 of 28 positions shown; findings below may reference images not displayed]

[id number]

Indications

 Obesity complicating pregnancy, third
 trimester (BMI 33)
 Encounter for antenatal screening for
 malformations
 27 weeks gestation of pregnancy
 Neg AFP, Neg Horizon and LR Nips
Fetal Evaluation

 Num Of Fetuses:         1
 Fetal Heart Rate(bpm):  157
 Cardiac Activity:       Observed
 Presentation:           Cephalic
 Placenta:               Posterior
 P. Cord Insertion:      Visualized

 Amniotic Fluid
 AFI FV:      Within normal limits

 AFI Sum(cm)     %Tile       Largest Pocket(cm)
 15.49           55

 RUQ(cm)       RLQ(cm)       LUQ(cm)        LLQ(cm)

Biometry

 BPD:      68.5  mm     G. Age:  27w 4d         28  %    CI:        75.71   %    70 - 86
                                                         FL/HC:      20.2   %    18.8 -
 HC:      249.6  mm     G. Age:  27w 1d          7  %    HC/AC:      1.09        1.05 -
 AC:      229.9  mm     G. Age:  27w 2d         27  %    FL/BPD:     73.4   %    71 - 87
 FL:       50.3  mm     G. Age:  27w 0d         15  %    FL/AC:      21.9   %    20 - 24
 HUM:        44  mm     G. Age:  26w 1d          8  %

 LV:        3.4  mm

 Est. FW:    7188  gm      2 lb 5 oz     17  %
OB History

 Gravidity:    5         Term:   3
 Living:       3
Gestational Age

 LMP:           27w 6d        Date:  04/06/20                 EDD:   01/11/21
 U/S Today:     27w 2d                                        EDD:   01/15/21
 Best:          27w 6d     Det. By:  LMP  (04/06/20)          EDD:   01/11/21
Anatomy

 Cranium:               Appears normal         Aortic Arch:            Previously seen
 Cavum:                 Appears normal         Ductal Arch:            Previously seen
 Ventricles:            Appears normal         Diaphragm:              Appears normal
 Choroid Plexus:        Previously seen        Stomach:                Appears normal, left
                                                                       sided
 Cerebellum:            Previously seen        Abdomen:                Previously seen
 Posterior Fossa:       Previously seen        Abdominal Wall:         Previously seen
 Nuchal Fold:           Not applicable (>20    Cord Vessels:           Previously seen
                        wks GA)
 Face:                  Orbits and profile     Kidneys:                Appear normal
                        previously seen
 Lips:                  Previously seen        Bladder:                Appears normal
 Thoracic:              Appears normal         Spine:                  Previously seen
 Heart:                 Appears normal         Upper Extremities:      Previously seen
                        (4CH, axis, and
                        situs)
 RVOT:                  Previously seen        Lower Extremities:      Previously seen
 LVOT:                  Previously seen

 Other:  Normal genaitalia. Fetus appears to be female. Nasal bone
         visualized. Technically difficult due to fetal position. IVC/SVC
         Visualized
Comments

 This patient was seen for a follow up growth scan due to
 maternal obesity.  She denies any problems since her last
 exam.
 She was informed that the fetal growth and amniotic fluid
 level appears appropriate for her gestational age.
 As the fetal growth is within normal limits, no further exams
 were scheduled in our office.

## 2021-10-31 IMAGING — US US OB LIMITED
1 series · 7 of 7 positions shown · non-contrast
Comparison: none

[Series 1: us ob limited · 7 of 7 slices shown]
[im 1/7]
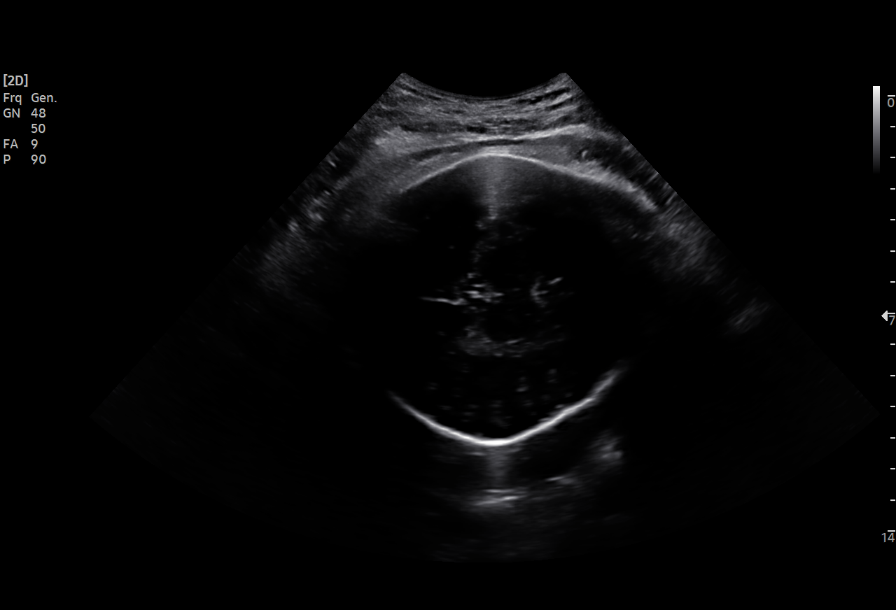
[im 2/7]
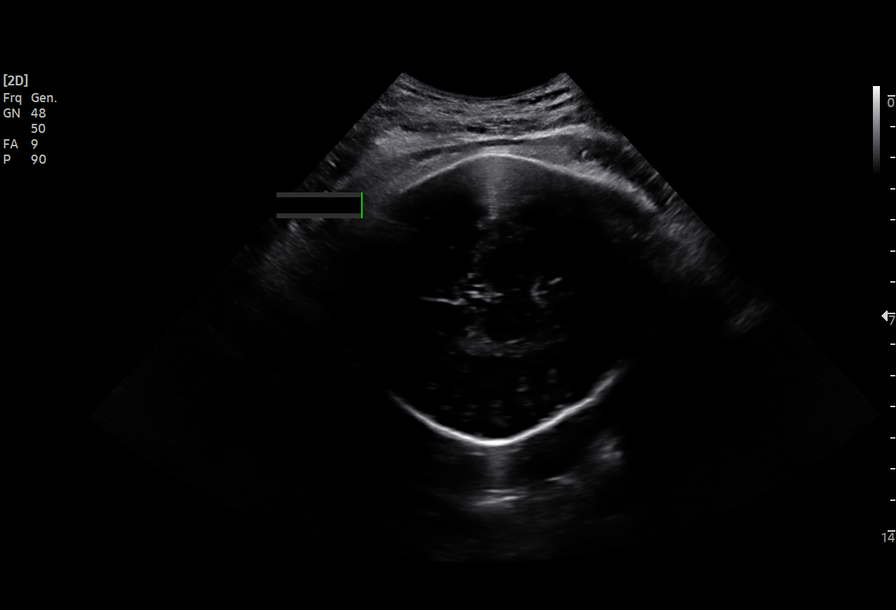
[im 3/7]
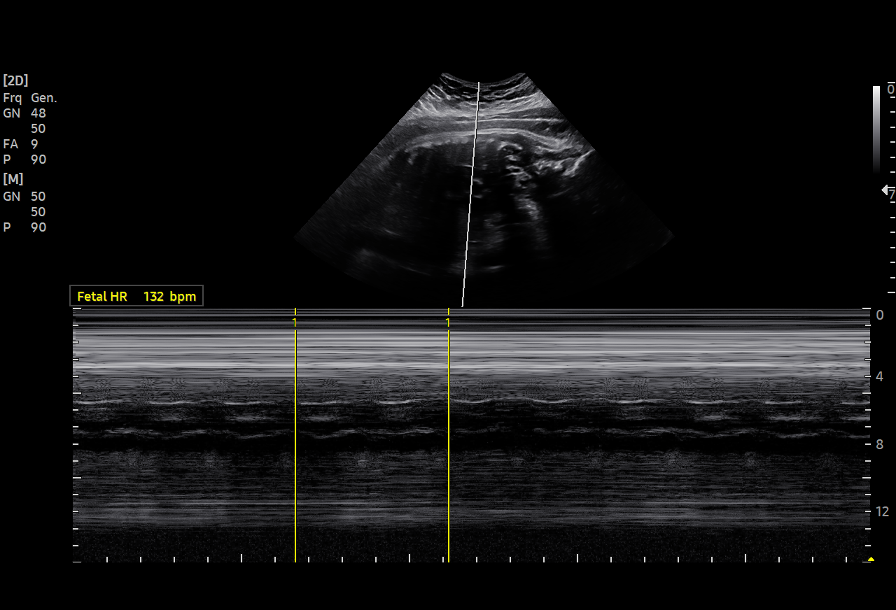
[im 4/7]
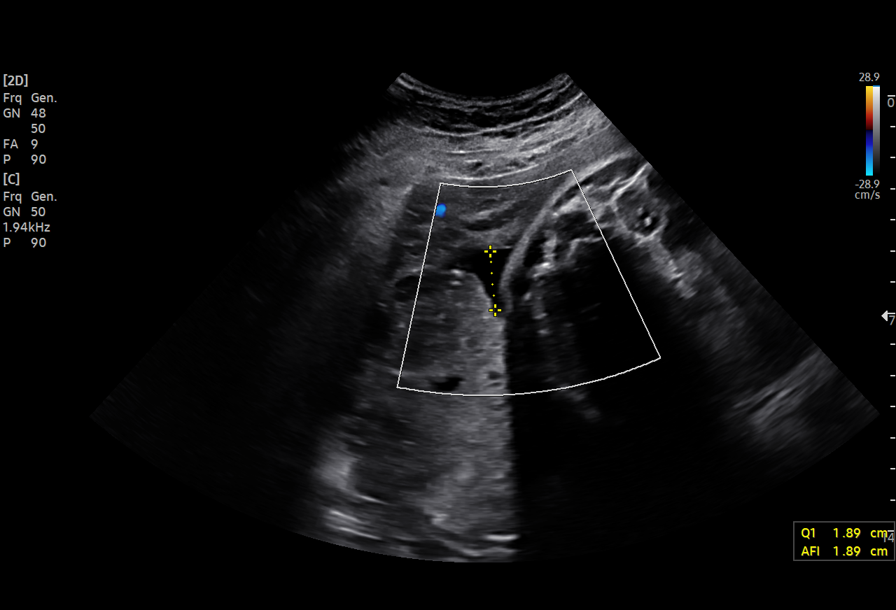
[im 5/7]
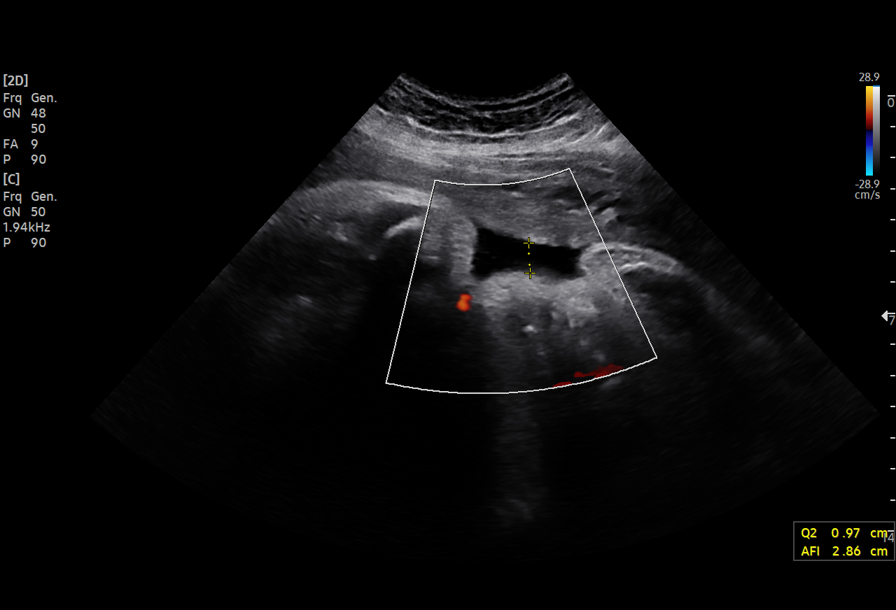
[im 6/7]
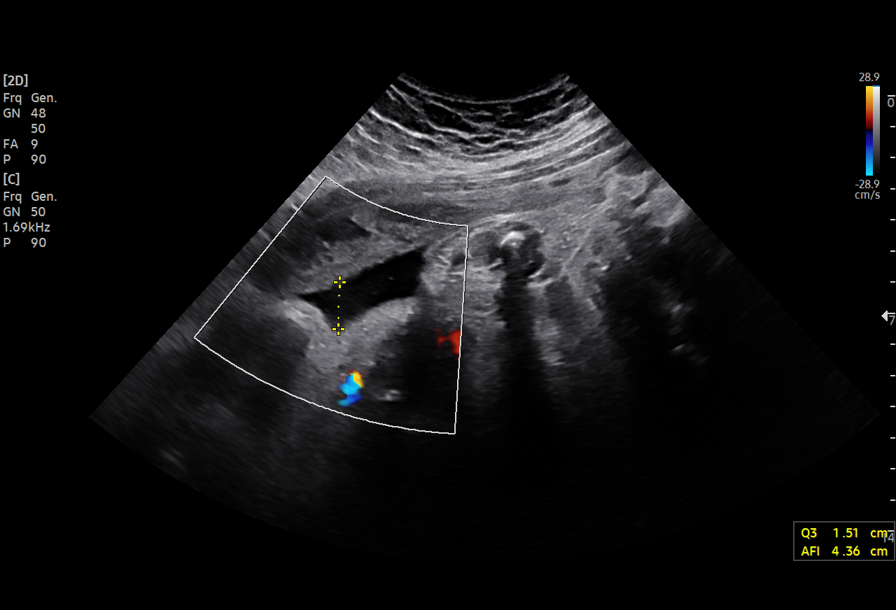
[im 7/7]
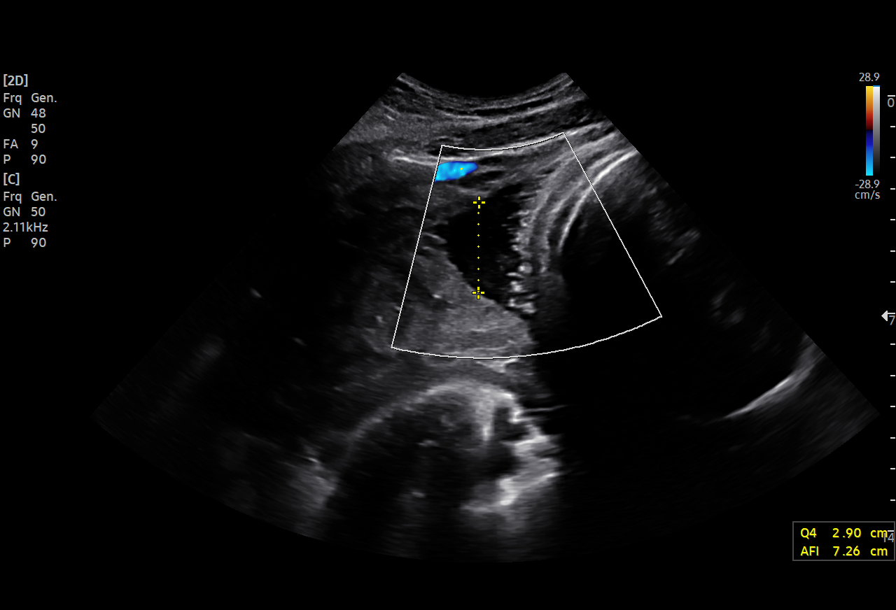

[7 of 7 positions shown; findings below may reference images not displayed]

[REDACTED]care
                   [id number]

 1  [HOSPITAL]                         [address].0     ABIMELK TIGER

Indications

 40 weeks gestation of pregnancy
 Postdate pregnancy (40-42 weeks)
Fetal Evaluation

 Num Of Fetuses:         1
 Fetal Heart Rate(bpm):  132
 Cardiac Activity:       Observed
 Fetal Lie:              Maternal right side
 Presentation:           Cephalic

 AFI Sum(cm)     %Tile       Largest Pocket(cm)
 7.27            7

 RUQ(cm)       RLQ(cm)       LUQ(cm)        LLQ(cm)

OB History

 Gravidity:    5         Term:   3
 Living:       3
Gestational Age
 LMP:           40w 1d        Date:  04/06/20                 EDD:   01/11/21
 Best:          40w 1d     Det. By:  LMP  (04/06/20)          EDD:   01/11/21
Comments

 A limited ultrasound performed today shows that the fetus is
 in the vertex presentation.

 There was normal amniotic fluid noted.
Impression

 Post dates testing is reassuring. NST is reactive, AFI is
 normal at 7.2 cm.
Recommendations

 Follow-up as clinically indicated.
               Sonqishe, Jecky

## 2022-06-12 ENCOUNTER — Ambulatory Visit (HOSPITAL_COMMUNITY)
Admission: EM | Admit: 2022-06-12 | Discharge: 2022-06-12 | Disposition: A | Discharge status: Home / Self Care | Payer: Medicaid Other | Attending: Physician Assistant | Admitting: Physician Assistant

## 2022-06-12 ENCOUNTER — Encounter (HOSPITAL_COMMUNITY): Payer: Self-pay

## 2022-06-12 DIAGNOSIS — N644 Mastodynia: Secondary | ICD-10-CM

## 2022-06-12 DIAGNOSIS — N61 Mastitis without abscess: Secondary | ICD-10-CM

## 2022-06-12 DIAGNOSIS — Z3202 Encounter for pregnancy test, result negative: Secondary | ICD-10-CM

## 2022-06-12 LAB — POC URINE PREG, ED: Preg Test, Ur: NEGATIVE

## 2022-06-12 MED ORDER — CEPHALEXIN 500 MG PO CAPS: 500.0000 mg | ORAL_CAPSULE | Freq: Four times a day (QID) | ORAL | 0 refills | Status: DC

## 2022-06-12 MED ORDER — IBUPROFEN 600 MG PO TABS: 600.0000 mg | ORAL_TABLET | Freq: Three times a day (TID) | ORAL | 0 refills | Status: DC | PRN

## 2022-06-12 NOTE — Discharge Instructions (Signed)
Your urine pregnancy test was negative.  Start Keflex 500 mg 4 times daily.  Use ibuprofen for pain.  Do not take NSAIDs with this medication including aspirin, ibuprofen/Advil, naproxen/Aleve.  I have put in for you to get a mammogram and ultrasound.  Someone should contact you to schedule an appointment to have this done within the next week.  If you do not hear from them please let me know.  If anything worsens and you develop fever, nausea, vomiting, increased pain, redness, nipple discharge you need to be seen immediately.

## 2022-06-12 NOTE — ED Provider Notes (Signed)
[facility]    CSN: [id number] Arrival date & time: 06/12/22  1618      History   Chief Complaint Chief Complaint  Patient presents with   Breast Pain    HPI TALAYEH BRUINSMA is a 26 y.o. female.   Patient presents today with a 1 day history of left breast pain.  She reports that pain is superior and midline to her nipple, rated 8 on a 0-10 pain scale, described as soreness, worse with palpation, no alleviating factors identified.  She is not currently breast-feeding and denies any recent piercing to the nipple.  She quit breast-feeding her infant approximately 6 months ago.  She denies any personal or family history of breast cancer.  She denies any systemic symptoms including fever, nausea, vomiting.  She has not tried any over-the-counter medication for symptom management.  She does not believe she could be pregnant as she is on Nexplanon.    Past Medical History:  Diagnosis Date   Anemia    Anxiety    Familial hypospadias of penis 10/23/2017    Patient Active Problem List   Diagnosis Date Noted   Maternal varicella, non-immune 10/23/2017    Past Surgical History:  Procedure Laterality Date   leg suregry- childhood      OB History     Gravida  5   Para  4   Term  4   Preterm  0   AB  1   Living  4      SAB  1   IAB  0   Ectopic  0   Multiple  0   Live Births  4            Home Medications    Prior to Admission medications   Medication Sig Start Date End Date Taking? Authorizing Provider  cephALEXin (KEFLEX) 500 MG capsule Take 1 capsule (500 mg total) by mouth 4 (four) times daily. 06/12/22  Yes Lowen Mansouri K, PA-C  ibuprofen (ADVIL) 600 MG tablet Take 1 tablet (600 mg total) by mouth every 8 (eight) hours as needed. 06/12/22   Yzabelle Calles, Derry Skill, PA-C    Family History Family History  Problem Relation Age of Onset   Diabetes Maternal Aunt    Cancer Maternal Aunt    Diabetes Maternal Uncle    Cancer Maternal Uncle     Hypertension Maternal Uncle    Stroke Maternal Uncle     Social History Social History   Tobacco Use   Smoking status: Never   Smokeless tobacco: Never  Vaping Use   Vaping Use: Former  Substance Use Topics   Alcohol use: No   Drug use: Not Currently     Allergies   Patient has no known allergies.   Review of Systems Review of Systems  Constitutional:  Negative for activity change, appetite change, fatigue and fever.  Respiratory:  Negative for cough and shortness of breath.   Cardiovascular:  Negative for chest pain.  Gastrointestinal:  Negative for abdominal pain, diarrhea, nausea and vomiting.     Physical Exam Triage Vital Signs ED Triage Vitals  Enc Vitals Group     BP 06/12/22 1747 110/76     Pulse Rate 06/12/22 1747 93     Resp 06/12/22 1747 12     Temp 06/12/22 1747 98.1 F (36.7 C)     Temp Source 06/12/22 1747 Oral     SpO2 06/12/22 1747 97 %     Weight --  Height --      Head Circumference --      Peak Flow --      Pain Score 06/12/22 1744 8     Pain Loc --      Pain Edu? --      Excl. in [place]? --    No data found.  Updated Vital Signs BP 110/76 (BP Location: Right Arm)   Pulse 93   Temp 98.1 F (36.7 C) (Oral)   Resp 12   LMP 05/12/2022   SpO2 97%   Visual Acuity Right Eye Distance:   Left Eye Distance:   Bilateral Distance:    Right Eye Near:   Left Eye Near:    Bilateral Near:     Physical Exam Vitals reviewed.  Constitutional:      General: She is awake. She is not in acute distress.    Appearance: Normal appearance. She is well-developed. She is not ill-appearing.     Comments: Very pleasant female appears stated age in no acute distress sitting comfortably in exam room  HENT:     Head: Normocephalic and atraumatic.  Cardiovascular:     Rate and Rhythm: Normal rate and regular rhythm.     Heart sounds: Normal heart sounds, S1 normal and S2 normal. No murmur heard. Pulmonary:     Effort: Pulmonary effort is normal.      Breath sounds: Normal breath sounds. No wheezing, rhonchi or rales.     Comments: Clear to auscultation bilaterally Chest:  Breasts:    Right: No swelling, inverted nipple, mass, nipple discharge, skin change or tenderness.     Left: Tenderness present. No swelling, inverted nipple, mass, nipple discharge or skin change.  Abdominal:     Palpations: Abdomen is soft.     Tenderness: There is no abdominal tenderness.  Lymphadenopathy:     Upper Body:     Right upper body: No supraclavicular or axillary adenopathy.     Left upper body: No supraclavicular or axillary adenopathy.  Psychiatric:        Behavior: Behavior is cooperative.      UC Treatments / Results  Labs (all labs ordered are listed, but only abnormal results are displayed) Labs Reviewed  POC URINE PREG, ED    EKG   Radiology No results found.  Procedures Procedures (including critical care time)  Medications Ordered in UC Medications - No data to display  Initial Impression / Assessment and Plan / UC Course  I have reviewed the triage vital signs and the nursing notes.  Pertinent labs & imaging results that were available during my care of the patient were reviewed by me and considered in my medical decision making (see chart for details).     Patient is well-appearing, afebrile, nontoxic, nontachycardic.  She is significant tenderness over superior midline left breast without obvious mass or abscess.  She was started on ibuprofen to help manage pain with instructions to take NSAIDs with this medication.  Will cover with Keflex given sudden presentation with focal pain to cover for mastitis.  Diagnostic ultrasounds and mammogram were ordered.  She was instructed to contact us if she has not heard from imaging within the week to arrange testing.  She is to rest and drink plenty of fluid.  We discussed that if she has any changing or worsening symptoms including fever, nausea, vomiting, breast mass, nipple  discharge, nipple inversion she should be seen immediately.  Strict return precautions given.  Work excuse note provided.  Final Clinical Impressions(s) / UC Diagnoses   Final diagnoses:  Breast pain, left  Mastitis     Discharge Instructions      Your urine pregnancy test was negative.  Start Keflex 500 mg 4 times daily.  Use ibuprofen for pain.  Do not take NSAIDs with this medication including aspirin, ibuprofen/Advil, naproxen/Aleve.  I have put in for you to get a mammogram and ultrasound.  Someone should contact you to schedule an appointment to have this done within the next week.  If you do not hear from them please let me know.  If anything worsens and you develop fever, nausea, vomiting, increased pain, redness, nipple discharge you need to be seen immediately.     ED Prescriptions     Medication Sig Dispense Auth. Provider   ibuprofen (ADVIL) 600 MG tablet Take 1 tablet (600 mg total) by mouth every 8 (eight) hours as needed. 30 tablet Ninoshka Wainwright K, PA-C   cephALEXin (KEFLEX) 500 MG capsule Take 1 capsule (500 mg total) by mouth 4 (four) times daily. 20 capsule Leiana Rund, Derry Skill, PA-C      PDMP not reviewed this encounter.   Terrilee Croak, PA-C 06/12/22 1905

## 2022-06-12 NOTE — ED Triage Notes (Signed)
Pt is here for pain in left breast x 1 day

## 2022-06-15 ENCOUNTER — Other Ambulatory Visit: Payer: Self-pay

## 2022-06-15 ENCOUNTER — Other Ambulatory Visit (HOSPITAL_COMMUNITY): Payer: Self-pay | Admitting: Obstetrics and Gynecology

## 2022-06-15 DIAGNOSIS — N644 Mastodynia: Secondary | ICD-10-CM

## 2022-06-15 DIAGNOSIS — N61 Mastitis without abscess: Secondary | ICD-10-CM

## 2022-06-22 ENCOUNTER — Ambulatory Visit: Payer: Self-pay | Admitting: Hematology and Oncology

## 2022-06-22 VITALS — BP 122/72 | Wt 235.0 lb

## 2022-06-22 DIAGNOSIS — N644 Mastodynia: Secondary | ICD-10-CM

## 2022-06-22 NOTE — Patient Instructions (Signed)
[place] about breast self awareness and gave educational materials to take home. Patient did not need a Pap smear today due to last Pap smear was in 2021 per patient. Told patient about free cervical cancer screenings to receive a Pap smear if would like one next year. Let her know [employer] will cover Pap smears every 5 years unless has a history of abnormal Pap smears. Referred patient to the [facility] for diagnostic mammogram. Appointment scheduled for 06/23/22. Patient aware of appointment and will be there. Let patient know will follow up with her within the next couple weeks with results. Erin Jacobs verbalized understanding.  Melodye Ped, NP 3:16 PM

## 2022-06-22 NOTE — Progress Notes (Signed)
Ms. Erin Jacobs is a 26 y.o. female who presents to [facility] clinic today with complaint of bilateral breast pain.    Pap Smear: Pap not smear completed today. Last Pap smear was 2021 and was normal. Per patient has no history of an abnormal Pap smear. Last Pap smear result is not available in Epic.   Physical exam: Breasts Breasts symmetrical. No skin abnormalities bilateral breasts. No nipple retraction bilateral breasts. No nipple discharge bilateral breasts. No lymphadenopathy. No lumps palpated bilateral breasts.       Pelvic/Bimanual Pap is not indicated today    Smoking History: Patient has never smoked and was not referred to quit line.    Patient Navigation: Patient education provided. Access to services provided for patient through [facility] program. No interpreter provided. No transportation provided   Colorectal Cancer Screening: Per patient has never had colonoscopy completed No complaints today.    Breast and Cervical Cancer Risk Assessment: Patient does not have family history of breast cancer, known genetic mutations, or radiation treatment to the chest before age 66. Patient does not have history of cervical dysplasia, immunocompromised, or DES exposure in-utero.  Risk Assessment   No risk assessment data     A: [facility] exam without pap smear Bilateral breast pain noted for one week; stopped breast feeding x 6 months ago. Benign exam.   P: Referred patient to the [facility] for a diagnostic mammogram. Appointment scheduled 06/23/22.  Dayton Scrape A, NP 06/22/2022 3:15 PM

## 2022-06-23 ENCOUNTER — Ambulatory Visit
Admission: RE | Admit: 2022-06-23 | Discharge: 2022-06-23 | Disposition: A | Discharge status: Home / Self Care | Payer: No Typology Code available for payment source | Source: Ambulatory Visit | Attending: Obstetrics and Gynecology | Admitting: Obstetrics and Gynecology

## 2022-06-23 ENCOUNTER — Other Ambulatory Visit: Payer: Self-pay | Admitting: Obstetrics and Gynecology

## 2022-06-23 DIAGNOSIS — N644 Mastodynia: Secondary | ICD-10-CM

## 2022-06-23 DIAGNOSIS — N6320 Unspecified lump in the left breast, unspecified quadrant: Secondary | ICD-10-CM

## 2022-06-23 DIAGNOSIS — N61 Mastitis without abscess: Secondary | ICD-10-CM

## 2022-06-29 ENCOUNTER — Other Ambulatory Visit: Payer: Self-pay | Admitting: Obstetrics and Gynecology

## 2022-06-29 DIAGNOSIS — N644 Mastodynia: Secondary | ICD-10-CM

## 2022-06-29 DIAGNOSIS — N6320 Unspecified lump in the left breast, unspecified quadrant: Secondary | ICD-10-CM

## 2022-07-04 ENCOUNTER — Ambulatory Visit
Admission: RE | Admit: 2022-07-04 | Discharge: 2022-07-04 | Disposition: A | Discharge status: Home / Self Care | Payer: No Typology Code available for payment source | Source: Ambulatory Visit | Attending: Obstetrics and Gynecology | Admitting: Obstetrics and Gynecology

## 2022-07-04 ENCOUNTER — Other Ambulatory Visit: Payer: Self-pay | Admitting: Obstetrics and Gynecology

## 2022-07-04 DIAGNOSIS — R599 Enlarged lymph nodes, unspecified: Secondary | ICD-10-CM

## 2022-07-04 DIAGNOSIS — N644 Mastodynia: Secondary | ICD-10-CM

## 2022-07-04 DIAGNOSIS — N6320 Unspecified lump in the left breast, unspecified quadrant: Secondary | ICD-10-CM

## 2022-07-04 HISTORY — PX: BREAST BIOPSY: SHX20

## 2022-07-05 ENCOUNTER — Other Ambulatory Visit: Payer: Self-pay | Admitting: Obstetrics and Gynecology

## 2022-07-05 DIAGNOSIS — C50919 Malignant neoplasm of unspecified site of unspecified female breast: Secondary | ICD-10-CM

## 2022-07-05 DIAGNOSIS — C50912 Malignant neoplasm of unspecified site of left female breast: Secondary | ICD-10-CM

## 2022-07-06 ENCOUNTER — Telehealth: Payer: Self-pay

## 2022-07-06 ENCOUNTER — Ambulatory Visit (HOSPITAL_COMMUNITY): Payer: Self-pay

## 2022-07-06 ENCOUNTER — Ambulatory Visit
Admission: RE | Admit: 2022-07-06 | Discharge: 2022-07-06 | Disposition: A | Discharge status: Home / Self Care | Payer: No Typology Code available for payment source | Source: Ambulatory Visit

## 2022-07-06 ENCOUNTER — Other Ambulatory Visit: Payer: Self-pay | Admitting: Obstetrics and Gynecology

## 2022-07-06 ENCOUNTER — Ambulatory Visit
Admission: RE | Admit: 2022-07-06 | Discharge: 2022-07-06 | Disposition: A | Discharge status: Home / Self Care | Payer: No Typology Code available for payment source | Source: Ambulatory Visit | Attending: Obstetrics and Gynecology | Admitting: Obstetrics and Gynecology

## 2022-07-06 DIAGNOSIS — C50919 Malignant neoplasm of unspecified site of unspecified female breast: Secondary | ICD-10-CM

## 2022-07-06 DIAGNOSIS — R599 Enlarged lymph nodes, unspecified: Secondary | ICD-10-CM

## 2022-07-06 DIAGNOSIS — N6310 Unspecified lump in the right breast, unspecified quadrant: Secondary | ICD-10-CM

## 2022-07-06 NOTE — Telephone Encounter (Signed)
Attempted to contact patient regarding completing [employer] application. Left message on voicemail requesting a return call.

## 2022-07-07 ENCOUNTER — Ambulatory Visit
Admission: RE | Admit: 2022-07-07 | Discharge: 2022-07-07 | Disposition: A | Discharge status: Home / Self Care | Payer: No Typology Code available for payment source | Source: Ambulatory Visit | Attending: Obstetrics and Gynecology | Admitting: Obstetrics and Gynecology

## 2022-07-07 DIAGNOSIS — C50912 Malignant neoplasm of unspecified site of left female breast: Secondary | ICD-10-CM

## 2022-07-07 MED ORDER — GADOPICLENOL 0.5 MMOL/ML IV SOLN
10.0000 mL | Freq: Once | INTRAVENOUS | Status: AC | PRN
  Administered 2022-07-07: 10 mL via INTRAVENOUS

## 2022-07-12 ENCOUNTER — Telehealth: Payer: Self-pay

## 2022-07-12 NOTE — Telephone Encounter (Signed)
[employer] Medicaid application completed. Patient to sign and return form.

## 2022-07-14 ENCOUNTER — Ambulatory Visit
Admission: RE | Admit: 2022-07-14 | Discharge: 2022-07-14 | Disposition: A | Discharge status: Home / Self Care | Payer: No Typology Code available for payment source | Source: Ambulatory Visit | Attending: Obstetrics and Gynecology | Admitting: Obstetrics and Gynecology

## 2022-07-14 DIAGNOSIS — N6310 Unspecified lump in the right breast, unspecified quadrant: Secondary | ICD-10-CM

## 2022-07-14 DIAGNOSIS — R599 Enlarged lymph nodes, unspecified: Secondary | ICD-10-CM

## 2022-07-14 DIAGNOSIS — C50919 Malignant neoplasm of unspecified site of unspecified female breast: Secondary | ICD-10-CM

## 2022-07-14 HISTORY — PX: BREAST BIOPSY: SHX20

## 2022-07-20 NOTE — Progress Notes (Incomplete)
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 Breast, right, needle core biopsy, mass 7 o'clock (ribbon) COMPLEX SCLEROSING LESION WITH USUAL DUCTAL HYPERPLASIA SEE NOTE 2. Breast, right, needle core biopsy, mass 9 o'clock (coil) FAT NECROSIS NEGATIVE FOR MALIGNANCY SEE NOTE 3. Lymph node, needle/core biopsy, right axillary (hydromark) BENIGN FIBROADIPOSE TISSUE NEGATIVE FOR LYMPH NODE TISSUE OR MALIGNANCY Diagnosis Note 1. The diagnosis is supported with immunohistochemistry showing positive basal cell markers with calponin, p63 and smooth muscle myosin. Cytokeratin 5/6 highlights the usual ductal hyperplasia and E-cadherin is diffusely and strongly positive. [place] was notified on 07/18/2022. Claudette Laws MD Pathologist, Electronic Signature (Case signed 07/18/2022)   Receptor Status: Estrogen Receptor: 70%, POSITIVE, MODERATE STAINING INTENSITY Progesterone Receptor: 100%, POSITIVE, STRONG STAINING INTENSITY  Did patient present with symptoms (if so, please note symptoms) or was this found on screening mammography?:  She noted prior breast pain about 2 months ago   Past/Anticipated interventions by surgeon, if any: Dr. Brantley Stage on 07-18-22  Patient sent for evaluation of newly diagnosed left breast DCIS. She noted prior breast pain about 2 months ago. Mammograms were obtained and multiple lesions were found in both breast. Her left breast biopsy 2 weeks ago had a focus of DCIS was encountered. MRI was done which showed multiple abnormalities this area being his largest 6 cm. She has multiple right-sided lesions as well and these were biopsied a few days ago results pending. Family history of uterine cancer in her mother. Other cancers were noted by her mother who is with her today but could not remember subtype. Complains of right breast pain from recent biopsies. Patient has noticed no  masses in either breast.   Assessment and Plan:  Diagnoses and all orders for this visit:  Ductal carcinoma in situ (DCIS) of left breast - Ambulatory Referral to Oncology-Medical - Ambulatory Referral to Radiation Oncology - Ambulatory Referral to Plastic Surgery - Ambulatory Referral to [place]    Full workup is not complete yet. She does have left breast DCIS. She also is quite young. Recommend genetic testing  Recommend referral to medical oncology  Recommend referral to radiation oncology  Recommend plastic surgery referral for potential bilateral mastectomies. Once we have all the data back, we can decide on surgical path. We discussed her young age as well as recurrence rates are for lifetime which can be significant. We discussed the pros and cons of radiation as well and local regional recurrence rates of breast conserving surgery versus mastectomy reconstruction. We discussed the potential need for sentinel lymph node mapping depending on findings. Once her workup is complete, a surgical plan can be put in place  No follow-ups on file.  Kennieth Francois, MD  I spent a total of 55 minutes in both face-to-face and non-face-to-face activities, excluding procedures performed, for this visit on the date of this encounter. Electronically signed by Kennieth Francois, MD at 07/18/2022 2:54 PM EST  Past/Anticipated interventions by medical oncology, if any: none at this time  Lymphedema issues, if any:  {:18581} {t:21944}   Pain issues, if any:  {:18581} {PAIN DESCRIPTION:[id number]}  SAFETY ISSUES: Prior radiation? {:18581} Pacemaker/ICD? {:18581} Possible current pregnancy?{:18581} Is the patient on methotrexate? {:18581}  Current Complaints / other details:  ***

## 2022-07-25 ENCOUNTER — Inpatient Hospital Stay: Payer: Self-pay

## 2022-07-25 ENCOUNTER — Inpatient Hospital Stay: Payer: Self-pay | Admitting: Genetic Counselor

## 2022-07-25 NOTE — Progress Notes (Incomplete)
Radiation Oncology         [phone] ________________________________  Initial Outpatient Consultation  Name: Erin Jacobs MRN: [id number]  Date: 07/26/2022  DOB: 1996/06/29  WC:HENIDPO, No Pcp Per  Erroll Luna, MD   REFERRING PHYSICIAN: Erroll Luna, MD  DIAGNOSIS:    ICD-10-CM   1. Ductal carcinoma in situ (DCIS) of left breast  D05.12        Cancer Staging  No matching staging information was found for the patient.  Stage 0 (cTis (DCIS), cN0, cM0) Left Breast, Intermediate grade DCIS, ER+ / PR+ / Her2 not assessed  CHIEF COMPLAINT: Here to discuss management of left breast DCIS  HISTORY OF PRESENT ILLNESS::Erin Jacobs Loper is a 27 y.o. female who initially presented to an urgent care on 06/12/22 with left breast pain x 1 day. Per encounter notes, she detailed her pain as sore and worse with palpation. She is a new mother (gave birth on 01/19/21) and reports that she ceased breast feeding around May of 2023.   Her symptoms prompted an ultrasound of the left breast on 06/23/22 which revealed a round mass in the 3 o'clock left breast measuring 6 mm in the greatest dimension, located 1 cmfn. At the time of examination, the patient did report feeling a palpable lump within the outer periareolar left breast. No other left breast abnormalities or left axillary adenopathy were appreciated.   Biopsy of the 3 o'clock left breast mass on date of 07/04/22 showed intermediate grade DCIS measuring 6 mm in the greatest linear extent of the sample, with focal areas showing high-grade morphology.  ER status: 70% positive with moderate staining intensity; PR status 100% positive with strong staining intensity, Her2 not assessed.  Diagnostic bilateral breast mammogram and bilateral breast ultrasound performed on 07/06/22 (to rule out multifocal or contralateral disease) revealed: an indeterminate oval hypoechoic mass in the 9 o'clock right breast measuring 4 mm, an additional  indeterminate irregular hypoechoic mass in the 7 o'clock right breast, 3 cmfn, measuring 5 mm, and a morphologically abnormal lymph node in the right axilla, measuring 6 mm in thickness.   Bilateral breast MRI on 07/07/22 showed: the biopsy proven DCIS in the outer left breast measuring 8 mm; an additional segmental non-mass enhancement extending from the adjacent subareolar left breast to the LOQ measuring 6.2 cm in the greatest extent, suspicious for additional extent of disease; a focal non-mass enhancement within the 9 o'clock right breast at an anterior depth measuring approximately 1.3 cm ; and an additional irregular enhancing mass within the lower right  breast (6-7 o'clock axis) measuring 9 mm. MRI also redemonstrated the single abnormal right axillary lymph node.    Biopsy of the 7 o'clock right breast mass on 07/14/22 showed no evidence of malignancy and findings consistent with a complex sclerosing lesion with usual ductal hyperplasia. Biopsies of the 9 o'clock right breast mass and abnormal right axillary lymph node were also performed and showed benign findings.    Accordingly, the patient was referred to Dr. Brantley Stage on 07/18/22 to discuss treatment options. Based on her young age, Dr. Brantley Stage recommends a referral to genetics. In terms of surgery, the option of bilateral mastectomies was discussed with the patient followed by reconstructive surgery. The patient will return to Dr. Brantley Stage in the near future to make a final surgical decision pending our discussion today, and discussions with medical oncology and plastic surgery.    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    PAST SURGICAL HISTORY: Past Surgical History:  Procedure Laterality Date   BREAST BIOPSY Left 07/04/2022   [place] LT BREAST BX W LOC DEV 1ST LESION IMG BX SPEC US GUIDE 07/04/2022 [facility]    BREAST BIOPSY Right 07/14/2022   times 3   BREAST BIOPSY Right 07/14/2022   [place] RT BREAST BX W LOC DEV 1ST LESION IMG BX SPEC US GUIDE 07/14/2022 [facility]   BREAST BIOPSY Right 07/14/2022   [place] RT BREAST BX W LOC DEV EA ADD LESION IMG BX SPEC US GUIDE 07/14/2022 [facility]   leg suregry- childhood      FAMILY HISTORY: family history includes Cancer in her maternal aunt and maternal uncle; Diabetes in her maternal aunt and maternal uncle; Hypertension in her maternal uncle; Stroke in her maternal uncle.  SOCIAL HISTORY:  reports that she has never smoked. She has never used smokeless tobacco. She reports that she does not currently use drugs. She reports that she does not drink alcohol.  ALLERGIES: Patient has no known allergies.  MEDICATIONS:  Current Outpatient Medications  Medication Sig Dispense Refill   ibuprofen (ADVIL) 600 MG tablet Take 1 tablet (600 mg total) by mouth every 8 (eight) hours as needed. (Patient not taking: Reported on 06/22/2022) 30 tablet 0   No current facility-administered medications for this encounter.    REVIEW OF SYSTEMS: As above in HPI.   PHYSICAL EXAM:  vitals were not taken for this visit.   General: Alert and oriented, in no acute distress HEENT: Head is normocephalic. Extraocular movements are intact. Oropharynx is clear. Neck: Neck is supple, no palpable cervical or supraclavicular lymphadenopathy. Heart: Regular in rate and rhythm with no murmurs, rubs, or gallops. Chest: Clear to auscultation bilaterally, with no rhonchi, wheezes, or rales. Abdomen: Soft, nontender, nondistended, with no rigidity or guarding. Extremities: No cyanosis or edema. Lymphatics: see Neck Exam Skin: No concerning lesions. Musculoskeletal: symmetric strength and muscle tone throughout. Neurologic: Cranial nerves II through XII are grossly intact. No obvious focalities. Speech is fluent. Coordination is intact. Psychiatric: Judgment and insight are  intact. Affect is appropriate. Breasts: *** . No other palpable masses appreciated in the breasts or axillae *** .    ECOG = ***  0 - Asymptomatic (Fully active, able to carry on all predisease activities without restriction)  1 - Symptomatic but completely ambulatory (Restricted in physically strenuous activity but ambulatory and able to carry out work of a light or sedentary nature. For example, light housework, office work)  2 - Symptomatic, <50% in bed during the day (Ambulatory and capable of all self care but unable to carry out any work activities. Up and about more than 50% of waking hours)  3 - Symptomatic, >50% in bed, but not bedbound (Capable of only limited self-care, confined to bed or chair 50% or more of waking hours)  4 - Bedbound (Completely disabled. Cannot carry on any self-care. Totally confined to bed or chair)  5 - Death   Eustace Pen MM, Creech RH, Tormey DC, et al. [id number]). "Toxicity and response criteria of the [facility]". [place] Oncol. 5 (6): 649-55   LABORATORY DATA:  Lab Results  Component Value Date   WBC 7.4 01/17/2021   HGB 9.6 (L) 01/17/2021   HCT 33.4 (L) 01/17/2021   MCV 78.2 (L) 01/17/2021   PLT 333 01/17/2021   CMP     Component Value  Date/Time   NA 138 10/15/2020 0941   K 3.9 10/15/2020 0941   CL 105 10/15/2020 0941   CO2 18 (L) 10/15/2020 0941   GLUCOSE 74 10/15/2020 0941   GLUCOSE 100 (H) 06/27/2020 0954   BUN 4 (L) 10/15/2020 0941   CREATININE 0.68 10/15/2020 0941   CALCIUM 8.9 10/15/2020 0941   PROT 6.4 10/15/2020 0941   ALBUMIN 3.4 (L) 10/15/2020 0941   AST 11 10/15/2020 0941   ALT 6 10/15/2020 0941   ALKPHOS 81 10/15/2020 0941   BILITOT 0.2 10/15/2020 0941   GFRNONAA >60 06/27/2020 0954   GFRAA >60 03/07/2017 2321         RADIOGRAPHY: [place] RT BREAST BX W LOC DEV 1ST LESION IMG BX SPEC US GUIDE  Addendum Date: 07/20/2022   ADDENDUM REPORT: 07/20/2022 21:44 ADDENDUM: Pathology revealed COMPLEX  SCLEROSING LESION WITH USUAL DUCTAL HYPERPLASIA of the RIGHT breast, 7 o'clock, (ribbon clip). This was found to be concordant by Dr. Lovey Newcomer, with excision recommended. Pathology revealed FAT NECROSIS of the RIGHT breast, 9 o'clock (coil clip). This was found to be concordant by Dr. Lovey Newcomer. Pathology revealed BENIGN FIBROADIPOSE TISSUE, NEGATIVE FOR LYMPH NODE TISSUE OR MALIGNANCY of the RIGHT axilla, (hydromark clip). This was found to be discordant by Dr. Lovey Newcomer, due to lack of lymphoid tissue in biopsy sample. Pathology results were discussed with the patient by telephone. The patient reported doing well after the biopsies with tenderness at the sites. Post biopsy instructions and care were reviewed and questions were answered. The patient was encouraged to call [facility] for any additional concerns. My direct phone number was provided. The patient has a recent diagnosis of LEFT breast cancer and should follow her outlined treatment plan. Dr. Erroll Luna was notified of biopsy results and recommendations via secure EPIC message on July 19, 2022. The patient was asked to return for a RIGHT axillary ultrasound in 3 months and informed a reminder notice would be sent regarding this appointment. Pathology results reported by Terie Purser, RN on 07/20/2022. Electronically Signed   By: Lovey Newcomer M.D.   On: 07/20/2022 21:44   Result Date: 07/20/2022 CLINICAL DATA:  Patient with right breast masses and right axillary lymph node. EXAM: ULTRASOUND GUIDED RIGHT BREAST CORE NEEDLE BIOPSY COMPARISON:  Previous exam(s). PROCEDURE: I met with the patient and we discussed the procedure of ultrasound-guided biopsy, including benefits and alternatives. We discussed the high likelihood of a successful procedure. We discussed the risks of the procedure, including infection, bleeding, tissue injury, clip migration, and inadequate sampling. Informed written consent was given. The usual  time-out protocol was performed immediately prior to the procedure. Site 1: Right breast mass 7 o'clock position (ribbon) Lesion quadrant: Lower outer quadrant Using sterile technique and 1% Lidocaine as local anesthetic, under direct ultrasound visualization, a 14 gauge spring-loaded device was used to perform biopsy of right breast mass 7 ocl using a lateral approach. At the conclusion of the procedure ribbon shaped tissue marker clip was deployed into the biopsy cavity. Follow up 2 view mammogram was performed and dictated separately. Site 2: Right breast mass 9 o'clock position  (coil) Lesion quadrant: Upper outer quadrant Using sterile technique and 1% Lidocaine as local anesthetic, under direct ultrasound visualization, a 14 gauge spring-loaded device was used to perform biopsy of right breast mass 9 ocl using a lateral approach. At the conclusion of the procedure coil shaped tissue marker clip was deployed into the biopsy cavity. Follow  up 2 view mammogram was performed and dictated separately. Site 3: Right axillary node (HydroMARK) Lesion quadrant: Upper outer quadrant Using sterile technique and 1% Lidocaine as local anesthetic, under direct ultrasound visualization, a 14 gauge spring-loaded device was used to perform biopsy of right breast mass 7 ocl using a lateral approach. At the conclusion of the procedure [facility] shaped tissue marker clip was deployed into the biopsy cavity. Follow up 2 view mammogram was performed and dictated separately. IMPRESSION: Ultrasound guided biopsy of right breast masses and right axillary node. No apparent complications. Electronically Signed: By: Lovey Newcomer M.D. On: 07/14/2022 12:22  [place] AXILLARY NODE CORE BIOPSY RIGHT  Addendum Date: 07/20/2022   ADDENDUM REPORT: 07/20/2022 21:44 ADDENDUM: Pathology revealed COMPLEX SCLEROSING LESION WITH USUAL DUCTAL HYPERPLASIA of the RIGHT breast, 7 o'clock, (ribbon clip). This was found to be concordant by Dr. Lovey Newcomer, with  excision recommended. Pathology revealed FAT NECROSIS of the RIGHT breast, 9 o'clock (coil clip). This was found to be concordant by Dr. Lovey Newcomer. Pathology revealed BENIGN FIBROADIPOSE TISSUE, NEGATIVE FOR LYMPH NODE TISSUE OR MALIGNANCY of the RIGHT axilla, (hydromark clip). This was found to be discordant by Dr. Lovey Newcomer, due to lack of lymphoid tissue in biopsy sample. Pathology results were discussed with the patient by telephone. The patient reported doing well after the biopsies with tenderness at the sites. Post biopsy instructions and care were reviewed and questions were answered. The patient was encouraged to call [facility] for any additional concerns. My direct phone number was provided. The patient has a recent diagnosis of LEFT breast cancer and should follow her outlined treatment plan. Dr. Erroll Luna was notified of biopsy results and recommendations via secure EPIC message on July 19, 2022. The patient was asked to return for a RIGHT axillary ultrasound in 3 months and informed a reminder notice would be sent regarding this appointment. Pathology results reported by Terie Purser, RN on 07/20/2022. Electronically Signed   By: Lovey Newcomer M.D.   On: 07/20/2022 21:44   Result Date: 07/20/2022 CLINICAL DATA:  Patient with right breast masses and right axillary lymph node. EXAM: ULTRASOUND GUIDED RIGHT BREAST CORE NEEDLE BIOPSY COMPARISON:  Previous exam(s). PROCEDURE: I met with the patient and we discussed the procedure of ultrasound-guided biopsy, including benefits and alternatives. We discussed the high likelihood of a successful procedure. We discussed the risks of the procedure, including infection, bleeding, tissue injury, clip migration, and inadequate sampling. Informed written consent was given. The usual time-out protocol was performed immediately prior to the procedure. Site 1: Right breast mass 7 o'clock position (ribbon) Lesion quadrant: Lower outer  quadrant Using sterile technique and 1% Lidocaine as local anesthetic, under direct ultrasound visualization, a 14 gauge spring-loaded device was used to perform biopsy of right breast mass 7 ocl using a lateral approach. At the conclusion of the procedure ribbon shaped tissue marker clip was deployed into the biopsy cavity. Follow up 2 view mammogram was performed and dictated separately. Site 2: Right breast mass 9 o'clock position  (coil) Lesion quadrant: Upper outer quadrant Using sterile technique and 1% Lidocaine as local anesthetic, under direct ultrasound visualization, a 14 gauge spring-loaded device was used to perform biopsy of right breast mass 9 ocl using a lateral approach. At the conclusion of the procedure coil shaped tissue marker clip was deployed into the biopsy cavity. Follow up 2 view mammogram was performed and dictated separately. Site 3: Right axillary node (HydroMARK) Lesion quadrant: Upper  outer quadrant Using sterile technique and 1% Lidocaine as local anesthetic, under direct ultrasound visualization, a 14 gauge spring-loaded device was used to perform biopsy of right breast mass 7 ocl using a lateral approach. At the conclusion of the procedure [facility] shaped tissue marker clip was deployed into the biopsy cavity. Follow up 2 view mammogram was performed and dictated separately. IMPRESSION: Ultrasound guided biopsy of right breast masses and right axillary node. No apparent complications. Electronically Signed: By: Lovey Newcomer M.D. On: 07/14/2022 12:22  [place] RT BREAST BX W LOC DEV EA ADD LESION IMG BX SPEC US GUIDE  Addendum Date: 07/20/2022   ADDENDUM REPORT: 07/20/2022 21:44 ADDENDUM: Pathology revealed COMPLEX SCLEROSING LESION WITH USUAL DUCTAL HYPERPLASIA of the RIGHT breast, 7 o'clock, (ribbon clip). This was found to be concordant by Dr. Lovey Newcomer, with excision recommended. Pathology revealed FAT NECROSIS of the RIGHT breast, 9 o'clock (coil clip). This was found to be  concordant by Dr. Lovey Newcomer. Pathology revealed BENIGN FIBROADIPOSE TISSUE, NEGATIVE FOR LYMPH NODE TISSUE OR MALIGNANCY of the RIGHT axilla, (hydromark clip). This was found to be discordant by Dr. Lovey Newcomer, due to lack of lymphoid tissue in biopsy sample. Pathology results were discussed with the patient by telephone. The patient reported doing well after the biopsies with tenderness at the sites. Post biopsy instructions and care were reviewed and questions were answered. The patient was encouraged to call [facility] for any additional concerns. My direct phone number was provided. The patient has a recent diagnosis of LEFT breast cancer and should follow her outlined treatment plan. Dr. Erroll Luna was notified of biopsy results and recommendations via secure EPIC message on July 19, 2022. The patient was asked to return for a RIGHT axillary ultrasound in 3 months and informed a reminder notice would be sent regarding this appointment. Pathology results reported by Terie Purser, RN on 07/20/2022. Electronically Signed   By: Lovey Newcomer M.D.   On: 07/20/2022 21:44   Result Date: 07/20/2022 CLINICAL DATA:  Patient with right breast masses and right axillary lymph node. EXAM: ULTRASOUND GUIDED RIGHT BREAST CORE NEEDLE BIOPSY COMPARISON:  Previous exam(s). PROCEDURE: I met with the patient and we discussed the procedure of ultrasound-guided biopsy, including benefits and alternatives. We discussed the high likelihood of a successful procedure. We discussed the risks of the procedure, including infection, bleeding, tissue injury, clip migration, and inadequate sampling. Informed written consent was given. The usual time-out protocol was performed immediately prior to the procedure. Site 1: Right breast mass 7 o'clock position (ribbon) Lesion quadrant: Lower outer quadrant Using sterile technique and 1% Lidocaine as local anesthetic, under direct ultrasound visualization, a 14 gauge  spring-loaded device was used to perform biopsy of right breast mass 7 ocl using a lateral approach. At the conclusion of the procedure ribbon shaped tissue marker clip was deployed into the biopsy cavity. Follow up 2 view mammogram was performed and dictated separately. Site 2: Right breast mass 9 o'clock position  (coil) Lesion quadrant: Upper outer quadrant Using sterile technique and 1% Lidocaine as local anesthetic, under direct ultrasound visualization, a 14 gauge spring-loaded device was used to perform biopsy of right breast mass 9 ocl using a lateral approach. At the conclusion of the procedure coil shaped tissue marker clip was deployed into the biopsy cavity. Follow up 2 view mammogram was performed and dictated separately. Site 3: Right axillary node (HydroMARK) Lesion quadrant: Upper outer quadrant Using sterile technique and 1% Lidocaine as  local anesthetic, under direct ultrasound visualization, a 14 gauge spring-loaded device was used to perform biopsy of right breast mass 7 ocl using a lateral approach. At the conclusion of the procedure [facility] shaped tissue marker clip was deployed into the biopsy cavity. Follow up 2 view mammogram was performed and dictated separately. IMPRESSION: Ultrasound guided biopsy of right breast masses and right axillary node. No apparent complications. Electronically Signed: By: Lovey Newcomer M.D. On: 07/14/2022 12:22  [place] LT BREAST BX W LOC DEV 1ST LESION IMG BX SPEC US GUIDE  Addendum Date: 07/14/2022   ADDENDUM REPORT: 07/14/2022 14:43 ADDENDUM: Addendum for correction of report. No post clip mammogram was performed at the time of biopsy. Electronically Signed   By: Lovey Newcomer M.D.   On: 07/14/2022 14:43   Addendum Date: 07/14/2022   ADDENDUM REPORT: 07/14/2022 10:07 ADDENDUM: Pathology revealed DUCTAL CARCINOMA IN SITU, INTERMEDIATE GRADE, WITH FOCAL AREAS SHOWING HIGH-GRADE MORPHOLOGY of the LEFT breast, 3:00 o'clock, 1cmfn, (coil clip). This was found to  be concordant by Dr. Lovey Newcomer. Pathology revealed NEGATIVE FOR CARCINOMA, LYMPHOID TISSUE PRESENT of the LEFT axilla, (hydromark clip). This was found to be concordant by Dr. Lovey Newcomer. Pathology results were discussed with the patient by telephone. The patient reported doing well after the biopsies with tenderness at the sites. Post biopsy instructions and care were reviewed and questions were answered. The patient was encouraged to call [facility] for any additional concerns. My direct phone number was provided. The patient is scheduled for a BILATERAL diagnostic mammogram on July 06, 2022 and a BILATERAL breast MRI on July 07, 2022. Further recommendations will be guided by these imaging studies. Surgical consultation has been arranged with Dr. Erroll Luna at [facility] on July 18, 2022. Pathology results reported by Terie Purser, RN on 07/06/2022. Electronically Signed   By: Lovey Newcomer M.D.   On: 07/14/2022 10:07   Result Date: 07/14/2022 CLINICAL DATA:  Patient presents for ultrasound-guided biopsy left breast mass 3 o'clock position. Additionally, prior to the biopsy, ultrasound of the left axilla was performed as recommended on the prior ultrasound report as it was inadvertently not performed on the day of diagnostic examination. EXAM: ULTRASOUND GUIDED LEFT BREAST CORE NEEDLE BIOPSY COMPARISON:  Previous exam(s). PROCEDURE: I met with the patient and we discussed the procedure of ultrasound-guided biopsy, including benefits and alternatives. We discussed the high likelihood of a successful procedure. We discussed the risks of the procedure, including infection, bleeding, tissue injury, clip migration, and inadequate sampling. Informed written consent was given. The usual time-out protocol was performed immediately prior to the procedure. Prior to the procedure ultrasound of the left axilla wires performed. Multiple cortically thickened left  axillary lymph nodes were demonstrated measuring up to 6 mm. This was discussed with the patient and an ultrasound biopsy of 1 of the thickened lymph nodes was also recommended. Site 1: Left breast mass 3 o'clock position Lesion quadrant: Upper outer quadrant Using sterile technique and 1% Lidocaine as local anesthetic, under direct ultrasound visualization, a 14 gauge spring-loaded device was used to perform biopsy of left breast mass 3 o'clock position using a lateral approach. At the conclusion of the procedure coil shaped tissue marker clip was deployed into the biopsy cavity. Follow up 2 view mammogram was performed and dictated separately. Site 2: Left axillary lymph node Lesion quadrant: Upper outer quadrant Using sterile technique and 1% Lidocaine as local anesthetic, under direct ultrasound visualization, a 14 gauge spring-loaded  device was used to perform biopsy of left axillary lymph node using a lateral approach. At the conclusion of the procedure [facility] shaped tissue marker clip was deployed into the biopsy cavity. Follow up 2 view mammogram was performed and dictated separately. IMPRESSION: Ultrasound guided biopsy of left breast mass and left axillary lymph node. No apparent complications. Electronically Signed: By: Lovey Newcomer M.D. On: 07/04/2022 12:27  [place] AXILLARY NODE CORE BIOPSY LEFT  Addendum Date: 07/14/2022   ADDENDUM REPORT: 07/14/2022 14:43 ADDENDUM: Addendum for correction of report. No post clip mammogram was performed at the time of biopsy. Electronically Signed   By: Lovey Newcomer M.D.   On: 07/14/2022 14:43   Addendum Date: 07/14/2022   ADDENDUM REPORT: 07/14/2022 10:07 ADDENDUM: Pathology revealed DUCTAL CARCINOMA IN SITU, INTERMEDIATE GRADE, WITH FOCAL AREAS SHOWING HIGH-GRADE MORPHOLOGY of the LEFT breast, 3:00 o'clock, 1cmfn, (coil clip). This was found to be concordant by Dr. Lovey Newcomer. Pathology revealed NEGATIVE FOR CARCINOMA, LYMPHOID TISSUE PRESENT of the LEFT axilla,  (hydromark clip). This was found to be concordant by Dr. Lovey Newcomer. Pathology results were discussed with the patient by telephone. The patient reported doing well after the biopsies with tenderness at the sites. Post biopsy instructions and care were reviewed and questions were answered. The patient was encouraged to call [facility] for any additional concerns. My direct phone number was provided. The patient is scheduled for a BILATERAL diagnostic mammogram on July 06, 2022 and a BILATERAL breast MRI on July 07, 2022. Further recommendations will be guided by these imaging studies. Surgical consultation has been [facility] with [facility] [facility] Surgery on July 18, 2022. Pathology results reported by Terie Purser, RN on 07/06/2022. Electronically Signed   By: Lovey Newcomer M.D.   On: 07/14/2022 10:07   Result Date: 07/14/2022 CLINICAL DATA:  Patient presents for ultrasound-guided biopsy left breast mass 3 o'clock position. Additionally, prior to the biopsy, ultrasound of the left axilla was performed as recommended on the prior ultrasound report as it was inadvertently not performed on the day of diagnostic examination. EXAM: ULTRASOUND GUIDED LEFT BREAST CORE NEEDLE BIOPSY COMPARISON:  Previous exam(s). PROCEDURE: I met with the patient and we discussed the procedure of ultrasound-guided biopsy, including benefits and alternatives. We discussed the high likelihood of a successful procedure. We discussed the risks of the procedure, including infection, bleeding, tissue injury, clip migration, and inadequate sampling. Informed written consent was given. The usual time-out protocol was performed immediately prior to the procedure. Prior to the procedure ultrasound of the left axilla wires performed. Multiple cortically thickened left axillary lymph nodes were demonstrated measuring up to 6 mm. This was discussed with the patient and an ultrasound biopsy of  1 of the thickened lymph nodes was also recommended. Site 1: Left breast mass 3 o'clock position Lesion quadrant: Upper outer quadrant Using sterile technique and 1% Lidocaine as local anesthetic, under direct ultrasound visualization, a 14 gauge spring-loaded device was used to perform biopsy of left breast mass 3 o'clock position using a lateral approach. At the conclusion of the procedure coil shaped tissue marker clip was deployed into the biopsy cavity. Follow up 2 view mammogram was performed and dictated separately. Site 2: Left axillary lymph node Lesion quadrant: Upper outer quadrant Using sterile technique and 1% Lidocaine as local anesthetic, under direct ultrasound visualization, a 14 gauge spring-loaded device was used to perform biopsy of left axillary lymph node using a lateral approach. At the conclusion  of the procedure [facility] shaped tissue marker clip was deployed into the biopsy cavity. Follow up 2 view mammogram was performed and dictated separately. IMPRESSION: Ultrasound guided biopsy of left breast mass and left axillary lymph node. No apparent complications. Electronically Signed: By: Lovey Newcomer M.D. On: 07/04/2022 12:27   MM CLIP PLACEMENT RIGHT  Result Date: 07/14/2022 CLINICAL DATA:  Right breast masses and right axillary lymph node EXAM: 3D DIAGNOSTIC RIGHT MAMMOGRAM POST ULTRASOUND BIOPSY COMPARISON:  Previous exam(s). FINDINGS: 3D Mammographic images were obtained following ultrasound guided biopsy of right breast masses and right axillary node. Site 1: Right breast mass 7 o'clock position: Ribbon shaped clip in appropriate position Site 2: Right breast mass 9 o'clock position: Coil clip in appropriate position Site 3: Right axillary node: Hydromark clip: Not visualized IMPRESSION: Appropriate positioning of the biopsy marking clips at the site of biopsy in the right breast. Final Assessment: Post Procedure Mammograms for Marker Placement Electronically Signed   By: Lovey Newcomer  M.D.   On: 07/14/2022 12:29  MR BREAST BILATERAL W WO CONTRAST INC CAD  Result Date: 07/11/2022 CLINICAL DATA:  Recent ultrasound-guided biopsy for a LEFT breast mass revealing DCIS. EXAM: BILATERAL BREAST MRI WITH AND WITHOUT CONTRAST TECHNIQUE: Multiplanar, multisequence MR images of both breasts were obtained prior to and following the intravenous administration of 10 ml of Vueway Three-dimensional MR images were rendered by post-processing of the original MR data on an independent workstation. The three-dimensional MR images were interpreted, and findings are reported in the following complete MRI report for this study. Three dimensional images were evaluated at the independent interpreting workstation using the DynaCAD thin client. COMPARISON:  No previous breast MRI. Comparison is made to bilateral diagnostic mammogram and ultrasound dated 07/06/2022 and ultrasound-guided biopsy dated 07/04/2022. FINDINGS: Breast composition: b. Scattered fibroglandular tissue. Background parenchymal enhancement: Moderate to marked Right breast: Irregular enhancing mass within the lower RIGHT breast, 6-7 o'clock axis region, measuring 9 mm greatest dimension (series 6, image 119). Focal non-mass enhancement within the lower outer quadrant to 9 o'clock axis of the RIGHT breast, at anterior depth measuring approximately 1.3 cm extent (series 6, image 114). Left breast: Biopsy site marker within the outer periareolar LEFT breast (3 o'clock axis, 1 cm from the nipple, per earlier ultrasound localization), with surrounding non mass enhancement measuring 8 mm, corresponding to the biopsy-proven DCIS (series 6, image 81). Additional segmental non-mass enhancement extends from the adjacent subareolar LEFT breast to middle depth, lower outer quadrant, measuring 6.2 cm extent, suspicious for additional extent of disease (series 6, images 81 through 86). Lymph nodes: Single enlarged/morphologically abnormal lymph node in the RIGHT  axilla (series 6, image 40). Two enlarged/morphologically abnormal lymph nodes are seen within the LEFT axilla, 1 of which contains a biopsy clip from recent axillary lymph node biopsy which was negative for carcinoma. Ancillary findings:  None. IMPRESSION: 1. 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 2. Additional segmental non-mass enhancement extending from the adjacent subareolar LEFT breast to middle depth, lower outer quadrant, measuring 6.2 cm extent, suspicious for additional extent of disease. MRI-guided biopsies are recommended, with targeting of this enhancement at its middle/central and posterior aspect (series 6, images 84 to 86). 3. Focal non-mass enhancement within the lower outer quadrant to 9 o'clock axis of the RIGHT breast, at anterior depth, measuring approximately 1.3 cm extent. A likely sonographic correlate was  identified on RIGHT breast ultrasound of 07/06/2022, scheduled for ultrasound-guided biopsy on 07/14/2022. 4. Additional irregular enhancing mass within the lower RIGHT breast, 6-7 o'clock axis region, measuring 9 mm. A likely sonographic correlate was identified on RIGHT breast ultrasound of 07/06/2022, scheduled for ultrasound-guided biopsy on 07/14/2022. 5. Single enlarged/morphologically abnormal lymph node in the RIGHT axilla. This is also scheduled for ultrasound-guided biopsy on 07/14/2022. RECOMMENDATION: 1. Patient is scheduled on December 29th for ultrasound-guided biopsies of RIGHT breast masses at the 9 o'clock axis and 7 o'clock axis, and also for a morphologically abnormal lymph node in the RIGHT axilla. These breast masses are likely sonographic correlates for the focal non-mass enhancement and the irregular enhancing mass seen on today's MRI within the RIGHT breast. If ultrasound-guided biopsy results are benign, recommend a  follow-up breast MRI in 6 months per protocol. 2. If breast conservation surgery is considered for the LEFT breast, recommend MRI-guided biopsies for the suspicious non-mass enhancement adjacent to and extending posterior to patient's biopsy-proven DCIS within the LEFT breast, measuring 6.2 cm extent, with 2-site biopsy targeting of the enhancement at its middle/central and posterior aspects (series 6, images 84-86). BI-RADS CATEGORY  4: Suspicious. Electronically Signed   By: Franki Cabot M.D.   On: 07/11/2022 08:09  MM DIAG BREAST TOMO BILATERAL  Addendum Date: 07/08/2022   ADDENDUM REPORT: 07/08/2022 09:09 ADDENDUM: ERROR IN REPORT: Impression incorrectly states that earlier ultrasound evaluation of the LEFT axilla showed no enlarged or morphologically abnormal lymph nodes in the LEFT axilla. This is not the case. At the time of patient's ultrasound-guided biopsy for a LEFT breast mass on 07/04/2022 which demonstrated DCIS, preprocedure ultrasound of the LEFT axilla was also performed and showed multiple abnormal-appearing LEFT axillary lymph nodes with cortex measurement up to 6 mm. As such, ultrasound-guided biopsy was performed for 1 of the thickened lymph nodes. This ultrasound-guided biopsy of a LEFT axillary lymph node showed no evidence of malignancy. Electronically Signed   By: Franki Cabot M.D.   On: 07/08/2022 09:09   Result Date: 07/08/2022 CLINICAL DATA:  Recent ultrasound-guided biopsy for a LEFT breast mass revealing DCIS. Patient returns today for a bilateral diagnostic mammogram to exclude multifocal or contralateral disease. Patient has breast MRI scheduled tomorrow. EXAM: DIGITAL DIAGNOSTIC BILATERAL MAMMOGRAM WITH TOMOSYNTHESIS; ULTRASOUND LEFT BREAST LIMITED; ULTRASOUND RIGHT BREAST LIMITED TECHNIQUE: Bilateral digital diagnostic mammography and breast tomosynthesis was performed.; Targeted ultrasound examination of the left breast was performed.; Targeted ultrasound examination of  the right breast was performed COMPARISON:  Previous exam(s). ACR Breast Density Category b: There are scattered areas of fibroglandular density. FINDINGS: Bilateral diagnostic mammogram: Coil shaped biopsy clip within the anterior LEFT breast corresponds to patient's recent ultrasound-guided biopsy revealing DCIS. Surrounding fibroglandular tissues appear normal. No associated microcalcifications at the site of biopsy-proven DCIS. Initially questioned asymmetry within the retroareolar LEFT breast, at posterior depth, is compatible with superimposition of normal fibroglandular tissues on spot compression views. Ultrasound will be performed of this area to ensure benignity. Also, initially questioned asymmetry within the retroareolar RIGHT breast, slightly outer, at posterior depth, is resolved on spot compression views indicating superimposition of normal fibroglandular tissues. Ultrasound will also be performed of this area to ensure benignity. LEFT breast: Targeted ultrasound is performed, evaluating the retroareolar to slightly outer LEFT breast, showing only normal fibroglandular tissues and fat lobules throughout. No solid or cystic mass. RIGHT breast: Targeted ultrasound is performed, showing an oval hypoechoic mass in the RIGHT breast at the 9 o'clock axis, 8 cm  from the nipple, with anti parallel orientation, with echogenic rim, without internal vascularity, measuring 4 x 3 x 3 mm. There is an additional irregular hypoechoic mass in the RIGHT breast at the 7 o'clock axis, 3 cm from the nipple, without internal vascularity, measuring 5 x 4 x 4 mm. There is a single morphologically abnormal lymph node in the RIGHT axilla, with cortical thickness measuring 6 mm. IMPRESSION: 1. Indeterminate oval hypoechoic mass in the RIGHT breast at the 9 o'clock axis, 8 cm from the nipple, with echogenic rim, measuring 4 mm. Ultrasound-guided biopsy is recommended to exclude contralateral malignancy. 2. Additional  indeterminate irregular hypoechoic mass in the RIGHT breast at the 7 o'clock axis, 3 cm from the nipple, measuring 5 mm. Ultrasound-guided biopsy is recommended to exclude contralateral malignancy. 3. Morphologically abnormal lymph node in the RIGHT axilla, with cortical thickness measuring 6 mm. Ultrasound-guided biopsy is recommended to exclude lymph node metastasis. 4. Earlier ultrasound evaluation of the LEFT axilla showed no enlarged or morphologically abnormal lymph nodes in the LEFT axilla. RECOMMENDATION: 1. Ultrasound-guided biopsy for the RIGHT breast mass at the 9 o'clock axis. 2. Ultrasound-guided biopsy for the RIGHT breast mass at the 7 o'clock axis. 3. Ultrasound-guided biopsy of the single morphologically abnormal lymph node in the RIGHT axilla. Ultrasound-guided biopsies are scheduled on December 29th. I have discussed the findings and recommendations with the patient. If applicable, a reminder letter will be sent to the patient regarding the next appointment. BI-RADS CATEGORY  4: Suspicious. Electronically Signed: By: Franki Cabot M.D. On: 07/06/2022 16:55  US BREAST LTD UNI RIGHT INC AXILLA  Addendum Date: 07/08/2022   ADDENDUM REPORT: 07/08/2022 09:09 ADDENDUM: ERROR IN REPORT: Impression incorrectly states that earlier ultrasound evaluation of the LEFT axilla showed no enlarged or morphologically abnormal lymph nodes in the LEFT axilla. This is not the case. At the time of patient's ultrasound-guided biopsy for a LEFT breast mass on 07/04/2022 which demonstrated DCIS, preprocedure ultrasound of the LEFT axilla was also performed and showed multiple abnormal-appearing LEFT axillary lymph nodes with cortex measurement up to 6 mm. As such, ultrasound-guided biopsy was performed for 1 of the thickened lymph nodes. This ultrasound-guided biopsy of a LEFT axillary lymph node showed no evidence of malignancy. Electronically Signed   By: Franki Cabot M.D.   On: 07/08/2022 09:09   Result Date:  07/08/2022 CLINICAL DATA:  Recent ultrasound-guided biopsy for a LEFT breast mass revealing DCIS. Patient returns today for a bilateral diagnostic mammogram to exclude multifocal or contralateral disease. Patient has breast MRI scheduled tomorrow. EXAM: DIGITAL DIAGNOSTIC BILATERAL MAMMOGRAM WITH TOMOSYNTHESIS; ULTRASOUND LEFT BREAST LIMITED; ULTRASOUND RIGHT BREAST LIMITED TECHNIQUE: Bilateral digital diagnostic mammography and breast tomosynthesis was performed.; Targeted ultrasound examination of the left breast was performed.; Targeted ultrasound examination of the right breast was performed COMPARISON:  Previous exam(s). ACR Breast Density Category b: There are scattered areas of fibroglandular density. FINDINGS: Bilateral diagnostic mammogram: Coil shaped biopsy clip within the anterior LEFT breast corresponds to patient's recent ultrasound-guided biopsy revealing DCIS. Surrounding fibroglandular tissues appear normal. No associated microcalcifications at the site of biopsy-proven DCIS. Initially questioned asymmetry within the retroareolar LEFT breast, at posterior depth, is compatible with superimposition of normal fibroglandular tissues on spot compression views. Ultrasound will be performed of this area to ensure benignity. Also, initially questioned asymmetry within the retroareolar RIGHT breast, slightly outer, at posterior depth, is resolved on spot compression views indicating superimposition of normal fibroglandular tissues. Ultrasound will also be performed of this area to  ensure benignity. LEFT breast: Targeted ultrasound is performed, evaluating the retroareolar to slightly outer LEFT breast, showing only normal fibroglandular tissues and fat lobules throughout. No solid or cystic mass. RIGHT breast: Targeted ultrasound is performed, showing an oval hypoechoic mass in the RIGHT breast at the 9 o'clock axis, 8 cm from the nipple, with anti parallel orientation, with echogenic rim, without internal  vascularity, measuring 4 x 3 x 3 mm. There is an additional irregular hypoechoic mass in the RIGHT breast at the 7 o'clock axis, 3 cm from the nipple, without internal vascularity, measuring 5 x 4 x 4 mm. There is a single morphologically abnormal lymph node in the RIGHT axilla, with cortical thickness measuring 6 mm. IMPRESSION: 1. Indeterminate oval hypoechoic mass in the RIGHT breast at the 9 o'clock axis, 8 cm from the nipple, with echogenic rim, measuring 4 mm. Ultrasound-guided biopsy is recommended to exclude contralateral malignancy. 2. Additional indeterminate irregular hypoechoic mass in the RIGHT breast at the 7 o'clock axis, 3 cm from the nipple, measuring 5 mm. Ultrasound-guided biopsy is recommended to exclude contralateral malignancy. 3. Morphologically abnormal lymph node in the RIGHT axilla, with cortical thickness measuring 6 mm. Ultrasound-guided biopsy is recommended to exclude lymph node metastasis. 4. Earlier ultrasound evaluation of the LEFT axilla showed no enlarged or morphologically abnormal lymph nodes in the LEFT axilla. RECOMMENDATION: 1. Ultrasound-guided biopsy for the RIGHT breast mass at the 9 o'clock axis. 2. Ultrasound-guided biopsy for the RIGHT breast mass at the 7 o'clock axis. 3. Ultrasound-guided biopsy of the single morphologically abnormal lymph node in the RIGHT axilla. Ultrasound-guided biopsies are scheduled on December 29th. I have discussed the findings and recommendations with the patient. If applicable, a reminder letter will be sent to the patient regarding the next appointment. BI-RADS CATEGORY  4: Suspicious. Electronically Signed: By: Franki Cabot M.D. On: 07/06/2022 16:55  US BREAST LTD UNI LEFT INC AXILLA  Addendum Date: 07/08/2022   ADDENDUM REPORT: 07/08/2022 09:09 ADDENDUM: ERROR IN REPORT: Impression incorrectly states that earlier ultrasound evaluation of the LEFT axilla showed no enlarged or morphologically abnormal lymph nodes in the LEFT axilla.  This is not the case. At the time of patient's ultrasound-guided biopsy for a LEFT breast mass on 07/04/2022 which demonstrated DCIS, preprocedure ultrasound of the LEFT axilla was also performed and showed multiple abnormal-appearing LEFT axillary lymph nodes with cortex measurement up to 6 mm. As such, ultrasound-guided biopsy was performed for 1 of the thickened lymph nodes. This ultrasound-guided biopsy of a LEFT axillary lymph node showed no evidence of malignancy. Electronically Signed   By: Franki Cabot M.D.   On: 07/08/2022 09:09   Result Date: 07/08/2022 CLINICAL DATA:  Recent ultrasound-guided biopsy for a LEFT breast mass revealing DCIS. Patient returns today for a bilateral diagnostic mammogram to exclude multifocal or contralateral disease. Patient has breast MRI scheduled tomorrow. EXAM: DIGITAL DIAGNOSTIC BILATERAL MAMMOGRAM WITH TOMOSYNTHESIS; ULTRASOUND LEFT BREAST LIMITED; ULTRASOUND RIGHT BREAST LIMITED TECHNIQUE: Bilateral digital diagnostic mammography and breast tomosynthesis was performed.; Targeted ultrasound examination of the left breast was performed.; Targeted ultrasound examination of the right breast was performed COMPARISON:  Previous exam(s). ACR Breast Density Category b: There are scattered areas of fibroglandular density. FINDINGS: Bilateral diagnostic mammogram: Coil shaped biopsy clip within the anterior LEFT breast corresponds to patient's recent ultrasound-guided biopsy revealing DCIS. Surrounding fibroglandular tissues appear normal. No associated microcalcifications at the site of biopsy-proven DCIS. Initially questioned asymmetry within the retroareolar LEFT breast, at posterior depth, is compatible with superimposition of  normal fibroglandular tissues on spot compression views. Ultrasound will be performed of this area to ensure benignity. Also, initially questioned asymmetry within the retroareolar RIGHT breast, slightly outer, at posterior depth, is resolved on spot  compression views indicating superimposition of normal fibroglandular tissues. Ultrasound will also be performed of this area to ensure benignity. LEFT breast: Targeted ultrasound is performed, evaluating the retroareolar to slightly outer LEFT breast, showing only normal fibroglandular tissues and fat lobules throughout. No solid or cystic mass. RIGHT breast: Targeted ultrasound is performed, showing an oval hypoechoic mass in the RIGHT breast at the 9 o'clock axis, 8 cm from the nipple, with anti parallel orientation, with echogenic rim, without internal vascularity, measuring 4 x 3 x 3 mm. There is an additional irregular hypoechoic mass in the RIGHT breast at the 7 o'clock axis, 3 cm from the nipple, without internal vascularity, measuring 5 x 4 x 4 mm. There is a single morphologically abnormal lymph node in the RIGHT axilla, with cortical thickness measuring 6 mm. IMPRESSION: 1. Indeterminate oval hypoechoic mass in the RIGHT breast at the 9 o'clock axis, 8 cm from the nipple, with echogenic rim, measuring 4 mm. Ultrasound-guided biopsy is recommended to exclude contralateral malignancy. 2. Additional indeterminate irregular hypoechoic mass in the RIGHT breast at the 7 o'clock axis, 3 cm from the nipple, measuring 5 mm. Ultrasound-guided biopsy is recommended to exclude contralateral malignancy. 3. Morphologically abnormal lymph node in the RIGHT axilla, with cortical thickness measuring 6 mm. Ultrasound-guided biopsy is recommended to exclude lymph node metastasis. 4. Earlier ultrasound evaluation of the LEFT axilla showed no enlarged or morphologically abnormal lymph nodes in the LEFT axilla. RECOMMENDATION: 1. Ultrasound-guided biopsy for the RIGHT breast mass at the 9 o'clock axis. 2. Ultrasound-guided biopsy for the RIGHT breast mass at the 7 o'clock axis. 3. Ultrasound-guided biopsy of the single morphologically abnormal lymph node in the RIGHT axilla. Ultrasound-guided biopsies are scheduled on December  29th. I have discussed the findings and recommendations with the patient. If applicable, a reminder letter will be sent to the patient regarding the next appointment. BI-RADS CATEGORY  4: Suspicious. Electronically Signed: By: Franki Cabot M.D. On: 07/06/2022 16:55      IMPRESSION/PLAN: ***   It was a pleasure meeting the patient today. We discussed the risks, benefits, and side effects of radiotherapy. I recommend radiotherapy to the *** to reduce her risk of locoregional recurrence by 2/3.  We discussed that radiation would take approximately *** weeks to complete and that I would give the patient a few weeks to heal following surgery before starting treatment planning. *** If chemotherapy were to be given, this would precede radiotherapy. We spoke about acute effects including skin irritation and fatigue as well as much less common late effects including internal organ injury or irritation. We spoke about the latest technology that is used to minimize the risk of late effects for patients undergoing radiotherapy to the breast or chest wall. No guarantees of treatment were given. The patient is enthusiastic about proceeding with treatment. I look forward to participating in the patient's care.  I will await her referral back to me for postoperative follow-up and eventual CT simulation/treatment planning.  On date of service, in total, I spent *** minutes on this encounter. Patient was seen in person.   __________________________________________   Eppie Gibson, MD  This document serves as a record of services personally performed by Eppie Gibson, MD. It was created on her behalf by Roney Mans, a trained medical scribe.  The creation of this record is based on the scribe's personal observations and the provider's statements to them. This document has been checked and approved by the attending provider.

## 2022-07-26 ENCOUNTER — Telehealth: Payer: Self-pay

## 2022-07-26 ENCOUNTER — Other Ambulatory Visit: Payer: Self-pay | Admitting: Genetic Counselor

## 2022-07-26 ENCOUNTER — Other Ambulatory Visit: Payer: Self-pay

## 2022-07-26 ENCOUNTER — Ambulatory Visit
Admission: RE | Admit: 2022-07-26 | Discharge: 2022-07-26 | Disposition: A | Discharge status: Home / Self Care | Payer: Self-pay | Source: Ambulatory Visit | Attending: Radiation Oncology | Admitting: Radiation Oncology

## 2022-07-26 ENCOUNTER — Encounter: Payer: Self-pay | Admitting: Genetic Counselor

## 2022-07-26 ENCOUNTER — Ambulatory Visit: Admission: RE | Admit: 2022-07-26 | Payer: Self-pay | Source: Ambulatory Visit

## 2022-07-26 ENCOUNTER — Inpatient Hospital Stay: Payer: Self-pay

## 2022-07-26 ENCOUNTER — Inpatient Hospital Stay: Payer: Self-pay | Attending: Genetic Counselor | Admitting: Genetic Counselor

## 2022-07-26 DIAGNOSIS — Z8042 Family history of malignant neoplasm of prostate: Secondary | ICD-10-CM

## 2022-07-26 DIAGNOSIS — D0512 Intraductal carcinoma in situ of left breast: Secondary | ICD-10-CM | POA: Insufficient documentation

## 2022-07-26 DIAGNOSIS — C50919 Malignant neoplasm of unspecified site of unspecified female breast: Secondary | ICD-10-CM

## 2022-07-26 HISTORY — DX: Intraductal carcinoma in situ of left breast: D05.12

## 2022-07-26 LAB — GENETIC SCREENING ORDER

## 2022-07-26 NOTE — Telephone Encounter (Signed)
Left message on voicemail requesting a return call regarding medicaid application.

## 2022-07-26 NOTE — Progress Notes (Signed)
REFERRING PROVIDER: Erroll Luna, MD [address] [place],  [place] [address]  PRIMARY PROVIDER:  Patient, No Pcp Per  PRIMARY REASON FOR VISIT:  1. Family history of prostate cancer   2. Intraductal carcinoma in situ of left breast      HISTORY OF PRESENT ILLNESS:   Ms. Childers, a 27 y.o. female, was seen for a [facility] cancer genetics consultation at the request of Dr. Brantley Stage due to a personal history of breast cancer.  Ms. Starrett presents to clinic today to discuss the possibility of a hereditary predisposition to cancer, genetic testing, and to further clarify her future cancer risks, as well as potential cancer risks for family members.   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      CANCER HISTORY:  Oncology History   No history exists.     RISK FACTORS:  Menarche was at age 58.  First live birth at age 50.  OCP use for approximately 0 years.  Ovaries intact: yes.  Hysterectomy: no.  Menopausal status: premenopausal.  HRT use: 0 years. Colonoscopy: no; not examined. Mammogram within the last year: yes. Number of breast biopsies: 1. Up to date with pelvic exams: yes. Any excessive radiation exposure in the past: no  Past Medical History:  Diagnosis Date   Anemia    Anxiety    Familial hypospadias of penis 10/23/2017   Family history of prostate cancer    Intraductal carcinoma in situ of left breast 07/26/2022    Past Surgical History:  Procedure Laterality Date   BREAST BIOPSY Left 07/04/2022   [place] LT BREAST BX W LOC DEV 1ST LESION IMG BX SPEC US GUIDE 07/04/2022 [facility]   BREAST BIOPSY Right 07/14/2022   times 3   BREAST BIOPSY Right 07/14/2022   [place] RT BREAST BX W LOC DEV 1ST LESION IMG BX SPEC US GUIDE 07/14/2022 [facility]   BREAST BIOPSY Right 07/14/2022   [place] RT BREAST BX W LOC DEV EA ADD LESION IMG BX SPEC US GUIDE 07/14/2022 [facility]   leg suregry-  childhood      Social History   Socioeconomic History   Marital status: Single    Spouse name: Not on file   Number of children: 4   Years of education: Not on file   Highest education level: Not on file  Occupational History   Occupation: [employer]  Tobacco Use   Smoking status: Never   Smokeless tobacco: Never  Vaping Use   Vaping Use: Former  Substance and Sexual Activity   Alcohol use: No   Drug use: Not Currently   Sexual activity: Yes    Birth control/protection: Implant    Comment: Nexplanon  Other Topics Concern   Not on file  Social History Narrative   Not on file   Social Determinants of Health   Financial Resource Strain: Not on file  Food Insecurity: No Food Insecurity (06/22/2022)   Hunger Vital Sign    Worried About Running Out of Food in the Last Year: Never true    Ran Out of Food in the Last Year: Never true  Transportation Needs: No Transportation Needs (06/22/2022)   PRAPARE - Hydrologist (Medical): No    Lack of Transportation (Non-Medical): No  Physical Activity: Not on file  Stress: Not on file  Social Connections: Not on file     FAMILY HISTORY:  We obtained a detailed, 4-generation family history.  Significant diagnoses are listed below: Family History  Problem Relation Age of Onset   Cervical cancer Mother 37   Heart disease Father    Brain cancer Maternal Grandmother 71   Heart attack Maternal Grandfather    Diabetes Maternal Aunt    Diabetes Maternal Uncle    Hypertension Maternal Uncle    Stroke Maternal Uncle    Prostate cancer Other        MGMs brother   Throat cancer Other        MGMs brother     The patient has three sons and a daughter who are cancer free.  She has two maternal half brothers and five sisters and two paternal half brothers and 2 sisters who are all cancer free.  Her father is deceased and her mother is living.  The patient's mother had cervical cancer at age 33. She has three  brothers and a sister who are cancer free.  Her mother had brain cancer at 27. Her mother had one brother with prostate cancer and one with throat cancer.  The patient's father died of heart disease.  The patient does not have additional information about his family.  Ms. Blevins is unaware of previous family history of genetic testing for hereditary cancer risks. Patient's maternal ancestors are of African American descent, and paternal ancestors are of African American descent. There is no reported Ashkenazi Jewish ancestry. There is no known consanguinity.  GENETIC COUNSELING ASSESSMENT: Ms. Gerdeman is a 27 y.o. female with a personal history of breast cancer which is somewhat suggestive of a hereditary cancer syndrome and predisposition to cancer given her young age. We, therefore, discussed and recommended the following at today's visit.   DISCUSSION: We discussed that, in general, most cancer is not inherited in families, but instead is sporadic or familial. Sporadic cancers occur by chance and typically happen at older ages (>50 years) as this type of cancer is caused by genetic changes acquired during an individual's lifetime. Some families have more cancers than would be expected by chance; however, the ages or types of cancer are not consistent with a known genetic mutation or known genetic mutations have been ruled out. This type of familial cancer is thought to be due to a combination of multiple genetic, environmental, hormonal, and lifestyle factors. While this combination of factors likely increases the risk of cancer, the exact source of this risk is not currently identifiable or testable.  We discussed that 5 - 10% of breast cancer is hereditary, with most cases associated with BRCA mutations.  There are other genes that can be associated with hereditary breast cancer syndromes.  These include ATM, CHEK2 and PALB2.  We discussed that testing is beneficial for several reasons including  knowing how to follow individuals after completing their treatment, identifying whether potential treatment options such as PARP inhibitors would be beneficial, and understand if other family members could be at risk for cancer and allow them to undergo genetic testing.   We reviewed the characteristics, features and inheritance patterns of hereditary cancer syndromes. We also discussed genetic testing, including the appropriate family members to test, the process of testing, insurance coverage and turn-around-time for results. We discussed the implications of a negative, positive and/or variant of uncertain significant result. In order to get genetic test results in a timely manner so that Ms. Luu can use these genetic test results for surgical decisions, we recommended Ms. Bhatnagar pursue genetic testing for  the BRCAPlus. Once complete, we recommend Ms. Crume pursue reflex genetic testing to the CancerNext+RNAinsight gene panel.  The CancerNext gene panel offered by [employer] includes sequencing and rearrangement analysis for the following 34 genes:   APC, ATM, BARD1, BMPR1A, BRCA1, BRCA2, BRIP1, CDH1, CDK4, CDKN2A, CHEK2, DICER1, HOXB13, EPCAM, GREM1, MLH1, MRE11A, MSH2, MSH6, MUTYH, NBN, NF1, PALB2, PMS2, POLD1, POLE, PTEN, RAD50, RAD51C, RAD51D, SMAD4, SMARCA4, STK11, and TP53.    Based on Ms. Pazos's personal history of cancer, she meets medical criteria for genetic testing. Despite that she meets criteria, she may still have an out of pocket cost.   We discussed that some people do not want to undergo genetic testing due to fear of genetic discrimination.  A federal law called the Genetic Information Non-Discrimination Act (GINA) of 2008 helps protect individuals against genetic discrimination based on their genetic test results.  It impacts both health insurance and employment.  With health insurance, it protects against increased premiums, being kicked off insurance or being forced to take a  test in order to be insured.  For employment it protects against hiring, firing and promoting decisions based on genetic test results.  GINA does not apply to those in the [employer], those who work for companies with less than 15 employees, and new life insurance or long-term disability insurance policies.  Health status due to a cancer diagnosis is not protected under GINA.   PLAN: After considering the risks, benefits, and limitations, Ms. Asbridge provided informed consent to pursue genetic testing and the blood sample was sent to [facility] for analysis of the CancerNext-RNAinsight. Results should be available within approximately 2-3 weeks' time, at which point they will be disclosed by telephone to Ms. Thoma, as will any additional recommendations warranted by these results. Ms. Giarratano will receive a summary of her genetic counseling visit and a copy of her results once available. This information will also be available in Epic.   Ms. Sassaman questions were answered to her satisfaction today. Our contact information was provided should additional questions or concerns arise. Thank you for the referral and allowing [place] to share in the care of your patient.   Kenichi Cassada P. Florene Glen, [place], [facility] Licensed, Insurance risk surveyor [email or web address] phone: [phone]  The patient was seen for a total of 40 minutes in face-to-face genetic counseling.  The patient was seen alone.  Drs. Michell Heinrich, and/or [place] were available for questions, if needed..    _______________________________________________________________________ For Office Staff:  Number of people involved in session: 1 Was an Intern/ student involved with case: no

## 2022-07-27 NOTE — Progress Notes (Signed)
Location of Breast Cancer: DCIS right breast   Histology per Pathology Report:  Diagnosis 1. Breast, right, needle core biopsy, mass 7 o'clock (ribbon) COMPLEX SCLEROSING LESION WITH USUAL DUCTAL HYPERPLASIA SEE NOTE 2. Breast, right, needle core biopsy, mass 9 o'clock (coil) FAT NECROSIS NEGATIVE FOR MALIGNANCY SEE NOTE 3. Lymph node, needle/core biopsy, right axillary (hydromark) BENIGN FIBROADIPOSE TISSUE NEGATIVE FOR LYMPH NODE TISSUE OR MALIGNANCY Diagnosis Note 1. The diagnosis is supported with immunohistochemistry showing positive basal cell markers with calponin, p63 and smooth muscle myosin. Cytokeratin 5/6 highlights the usual ductal hyperplasia and E-cadherin is diffusely and strongly positive. [place] was notified on 07/18/2022. Claudette Laws MD Pathologist, Electronic Signature (Case signed 07/18/2022)     Receptor Status: Estrogen Receptor: 70%, POSITIVE, MODERATE STAINING INTENSITY Progesterone Receptor: 100%, POSITIVE, STRONG STAINING INTENSITY   Did patient present with symptoms (if so, please note symptoms) or was this found on screening mammography?:  She noted prior breast pain about 2 months ago    Past/Anticipated interventions by surgeon, if any: Dr. Brantley Stage on 07-18-22   Patient sent for evaluation of newly diagnosed left breast DCIS. She noted prior breast pain about 2 months ago. Mammograms were obtained and multiple lesions were found in both breast. Her left breast biopsy 2 weeks ago had a focus of DCIS was encountered. MRI was done which showed multiple abnormalities this area being his largest 6 cm. She has multiple right-sided lesions as well and these were biopsied a few days ago results pending. Family history of uterine cancer in her mother. Other cancers were noted by her mother who is with her today but could not remember subtype. Complains of right breast pain from recent biopsies. Patient has noticed no masses in either breast.     Assessment and Plan:  Diagnoses and all orders for this visit:  Ductal carcinoma in situ (DCIS) of left breast - Ambulatory Referral to Oncology-Medical - Ambulatory Referral to Radiation Oncology - Ambulatory Referral to Plastic Surgery - Ambulatory Referral to [facility]    Full workup is not complete yet. She does have left breast DCIS. She also is quite young. Recommend genetic testing  Recommend referral to medical oncology  Recommend referral to radiation oncology  Recommend plastic surgery referral for potential bilateral mastectomies. Once we have all the data back, we can decide on surgical path. We discussed her young age as well as recurrence rates are for lifetime which can be significant. We discussed the pros and cons of radiation as well and local regional recurrence rates of breast conserving surgery versus mastectomy reconstruction. We discussed the potential need for sentinel lymph node mapping depending on findings. Once her workup is complete, a surgical plan can be put in place  No follow-ups on file.  Kennieth Francois, MD  I spent a total of 55 minutes in both face-to-face and non-face-to-face activities, excluding procedures performed, for this visit on the date of this encounter. Electronically signed by Kennieth Francois, MD at 07/18/2022 2:54 PM EST  Past/Anticipated interventions by medical oncology, if any: none at this time   Lymphedema issues, if any:  none  Pain issues, if any:  breast is tender   SAFETY ISSUES: Prior radiation? none Pacemaker/ICD? none Possible current pregnancy? No, birth control in place Is the patient on methotrexate? no   Current Complaints / other details:  has four kids, including a one year old, no major concerns, waiting on surgery date for lumpectomy Vitals:  08/04/22 0842  BP: 132/76  Pulse: 86  Resp: 18  Temp: (!) 96.1 F (35.6 C)  SpO2: 100%

## 2022-07-28 ENCOUNTER — Telehealth: Payer: Self-pay

## 2022-07-28 NOTE — Telephone Encounter (Signed)
I contacted patient regarding [facility] Medicaid application. Patient to sign at 08/04/2022 [facility] visit.

## 2022-07-31 ENCOUNTER — Telehealth: Payer: Self-pay | Admitting: Genetic Counselor

## 2022-07-31 ENCOUNTER — Encounter: Payer: Self-pay | Admitting: Genetic Counselor

## 2022-07-31 DIAGNOSIS — Z1379 Encounter for other screening for genetic and chromosomal anomalies: Secondary | ICD-10-CM | POA: Insufficient documentation

## 2022-07-31 NOTE — Telephone Encounter (Signed)
Revealed  negative genetic testing on the BRCAPlus STAT panel.  The remainder of the results will be back in about 2 weeks and we will call when those are completed.

## 2022-08-03 ENCOUNTER — Telehealth (HOSPITAL_COMMUNITY): Payer: Self-pay

## 2022-08-03 NOTE — Progress Notes (Signed)
Radiation Oncology         ([phone]) [phone] ________________________________  Initial Outpatient Consultation  Name: Erin Jacobs MRN: [id number]  Date: 08/04/2022  DOB: 19-Mar-1996  LD:[id number], No Pcp Per  Erroll Luna, MD   REFERRING PHYSICIAN: Erroll Luna, MD  DIAGNOSIS:    ICD-10-CM   1. Ductal carcinoma in situ (DCIS) of left breast  D05.12     2. Intraductal carcinoma in situ of left breast  D05.12         Stage 0 (cTis (DCIS), cN0, cM0) Left Breast, Intermediate grade DCIS, ER+ / PR+ / Her2 not assessed  CHIEF COMPLAINT: Here to discuss management of left breast DCIS  HISTORY OF PRESENT ILLNESS::Erin Jacobs is a 27 y.o. female who initially presented to an urgent care on 06/12/22 with left breast pain x 1 day. Per encounter notes, she detailed her pain as sore and worse with palpation. She is a new mother (gave birth on 01/19/21) and reports that she ceased breast feeding around May of 2023.   Her symptoms prompted an ultrasound of the left breast on 06/23/22 which revealed a round mass in the 3 o'clock left breast measuring 6 mm in the greatest dimension, located 1 cmfn. At the time of examination, the patient did report feeling a palpable lump within the outer periareolar left breast. No other left breast abnormalities or left axillary adenopathy were appreciated.   Biopsy of the 3 o'clock left breast mass on date of 07/04/22 showed intermediate grade DCIS measuring 6 mm in the greatest linear extent of the sample, with focal areas showing high-grade morphology.  ER status: 70% positive with moderate staining intensity; PR status 100% positive with strong staining intensity, Her2 not assessed.  Diagnostic bilateral breast mammogram and bilateral breast ultrasound performed on 07/06/22 (to rule out multifocal or contralateral disease) revealed: an indeterminate oval hypoechoic mass in the 9 o'clock right breast measuring 4 mm, an additional indeterminate irregular  hypoechoic mass in the 7 o'clock right breast, 3 cmfn, measuring 5 mm, and a morphologically abnormal lymph node in the right axilla, measuring 6 mm in thickness.   Bilateral breast MRI on 07/07/22 showed: the biopsy proven DCIS in the outer left breast measuring 8 mm; an additional segmental non-mass enhancement extending from the adjacent subareolar left breast to the LOQ measuring 6.2 cm in the greatest extent, suspicious for additional extent of disease; a focal non-mass enhancement within the 9 o'clock right breast at an anterior depth measuring approximately 1.3 cm ; and an additional irregular enhancing mass within the lower right  breast (6-7 o'clock axis) measuring 9 mm. MRI also redemonstrated the single abnormal right axillary lymph node.    Biopsy of the 7 o'clock right breast mass on 07/14/22 showed no evidence of malignancy and findings consistent with a complex sclerosing lesion with usual ductal hyperplasia. Biopsies of the 9 o'clock right breast mass and abnormal right axillary lymph node were also performed and showed benign findings.    Accordingly, the patient was referred to Dr. Brantley Stage on 07/18/22 to discuss treatment options. Based on her young age, Dr. Brantley Stage recommends a referral to genetics. In terms of surgery, the option of bilateral mastectomies was discussed with the patient followed by reconstructive surgery. The patient will return to Dr. Brantley Stage in the near future to make a final surgical decision pending our discussion today, and discussions with medical oncology and plastic surgery.    She is a mother of 94 young children.  She currently uses  a hormonal implant in the soft tissue of her arm for birth control.  She reports chronic fatigue.  She reports continued tenderness in the left breast.  Preliminary genetic testing was negative for BRCA and other results from the panel are pending.  She sees medical oncology later today.  She is curious about the results of her MRI.   She is highly motivated to keep her breasts.    Lymphedema issues, if any:  none   Pain issues, if any:  breast is tender   SAFETY ISSUES: Prior radiation? none Pacemaker/ICD? none Possible current pregnancy?  birth control in place Is the patient on methotrexate? 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    PAST SURGICAL HISTORY: Past Surgical History:  Procedure Laterality Date   BREAST BIOPSY Left 07/04/2022   [place] LT BREAST BX W LOC DEV 1ST LESION IMG BX SPEC US GUIDE 07/04/2022 [facility]   BREAST BIOPSY Right 07/14/2022   times 3   BREAST BIOPSY Right 07/14/2022   [place] RT BREAST BX W LOC DEV 1ST LESION IMG BX SPEC US GUIDE 07/14/2022 [facility]   BREAST BIOPSY Right 07/14/2022   [place] RT BREAST BX W LOC DEV EA ADD LESION IMG BX SPEC US GUIDE 07/14/2022 [facility]   leg suregry- childhood      FAMILY HISTORY: family history includes Brain cancer (age of onset: 65) in her maternal grandmother; Cervical cancer (age of onset: 63) in her mother; Diabetes in her maternal aunt and maternal uncle; Heart attack in her maternal grandfather; Heart disease in her father; Hypertension in her maternal uncle; Prostate cancer in an other family member; Stroke in her maternal uncle; Throat cancer in an other family member.  SOCIAL HISTORY:  reports that she has never smoked. She has never used smokeless tobacco. She reports that she does not currently use drugs. She reports that she does not drink alcohol.  ALLERGIES: Patient has no known allergies.  MEDICATIONS:  Current Outpatient Medications  Medication Sig Dispense Refill   ibuprofen (ADVIL) 600 MG tablet Take 1 tablet (600 mg total) by mouth every 8 (eight) hours as needed. (Patient not taking: Reported on  06/22/2022) 30 tablet 0   No current facility-administered medications for this encounter.    REVIEW OF SYSTEMS: As above in HPI.   PHYSICAL EXAM:  height is '5\' 7"'$  (1.702 m) and weight is 234 lb 4 oz (106.3 kg). Her temporal temperature is 96.1 F (35.6 C) (abnormal). Her blood pressure is 132/76 and her pulse is 86. Her respiration is 18 and oxygen saturation is 100%.   General: Alert and oriented, in no acute distress HEENT: Head is normocephalic. Extraocular movements are intact.  Heart: Regular in rate and rhythm with no murmurs, rubs, or gallops. Chest: Clear to auscultation bilaterally, with no rhonchi, wheezes, or rales. Abdomen: Soft, nontender, nondistended, with no rigidity or guarding. Extremities: No cyanosis or edema. Lymphatics: see Neck Exam Skin: No concerning lesions. Musculoskeletal: symmetric strength and muscle tone throughout. Neurologic: Cranial nerves II through XII are grossly intact. No obvious focalities. Speech is fluent. Coordination is intact. Psychiatric: Judgment and insight are intact. Affect is appropriate. Breasts: Left breast is tender to palpation. No palpable masses appreciated in the breasts or axillae bilaterally.    ECOG = 0  0 - Asymptomatic (Fully  active, able to carry on all predisease activities without restriction)  1 - Symptomatic but completely ambulatory (Restricted in physically strenuous activity but ambulatory and able to carry out work of a light or sedentary nature. For example, light housework, office work)  2 - Symptomatic, <50% in bed during the day (Ambulatory and capable of all self care but unable to carry out any work activities. Up and about more than 50% of waking hours)  3 - Symptomatic, >50% in bed, but not bedbound (Capable of only limited self-care, confined to bed or chair 50% or more of waking hours)  4 - Bedbound (Completely disabled. Cannot carry on any self-care. Totally confined to bed or chair)  5 - Death    Eustace Pen MM, Creech RH, Tormey DC, et al. [phone]). "Toxicity and response criteria of the [facility]". [place] Oncol. 5 (6): 649-55   LABORATORY DATA:  Lab Results  Component Value Date   WBC 7.4 01/17/2021   HGB 9.6 (L) 01/17/2021   HCT 33.4 (L) 01/17/2021   MCV 78.2 (L) 01/17/2021   PLT 333 01/17/2021   CMP     Component Value Date/Time   NA 138 10/15/2020 0941   K 3.9 10/15/2020 0941   CL 105 10/15/2020 0941   CO2 18 (L) 10/15/2020 0941   GLUCOSE 74 10/15/2020 0941   GLUCOSE 100 (H) 06/27/2020 0954   BUN 4 (L) 10/15/2020 0941   CREATININE 0.68 10/15/2020 0941   CALCIUM 8.9 10/15/2020 0941   PROT 6.4 10/15/2020 0941   ALBUMIN 3.4 (L) 10/15/2020 0941   AST 11 10/15/2020 0941   ALT 6 10/15/2020 0941   ALKPHOS 81 10/15/2020 0941   BILITOT 0.2 10/15/2020 0941   GFRNONAA >60 06/27/2020 0954   GFRAA >60 03/07/2017 2321         RADIOGRAPHY: [place] RT BREAST BX W LOC DEV 1ST LESION IMG BX SPEC US GUIDE  Addendum Date: 07/20/2022   ADDENDUM REPORT: 07/20/2022 21:44 ADDENDUM: Pathology revealed COMPLEX SCLEROSING LESION WITH USUAL DUCTAL HYPERPLASIA of the RIGHT breast, 7 o'clock, (ribbon clip). This was found to be concordant by Dr. Lovey Newcomer, with excision recommended. Pathology revealed FAT NECROSIS of the RIGHT breast, 9 o'clock (coil clip). This was found to be concordant by Dr. Lovey Newcomer. Pathology revealed BENIGN FIBROADIPOSE TISSUE, NEGATIVE FOR LYMPH NODE TISSUE OR MALIGNANCY of the RIGHT axilla, (hydromark clip). This was found to be discordant by Dr. Lovey Newcomer, due to lack of lymphoid tissue in biopsy sample. Pathology results were discussed with the patient by telephone. The patient reported doing well after the biopsies with tenderness at the sites. Post biopsy instructions and care were reviewed and questions were answered. The patient was encouraged to call [facility] for any additional concerns. My direct phone number  was provided. The patient has a recent diagnosis of LEFT breast cancer and should follow her outlined treatment plan. Dr. Erroll Luna was notified of biopsy results and recommendations via secure EPIC message on July 19, 2022. The patient was asked to return for a RIGHT axillary ultrasound in 3 months and informed a reminder notice would be sent regarding this appointment. Pathology results reported by Terie Purser, RN on 07/20/2022. Electronically Signed   By: Lovey Newcomer M.D.   On: 07/20/2022 21:44   Result Date: 07/20/2022 CLINICAL DATA:  Patient with right breast masses and right axillary lymph node. EXAM: ULTRASOUND GUIDED RIGHT BREAST CORE NEEDLE BIOPSY COMPARISON:  Previous exam(s). PROCEDURE:  I met with the patient and we discussed the procedure of ultrasound-guided biopsy, including benefits and alternatives. We discussed the high likelihood of a successful procedure. We discussed the risks of the procedure, including infection, bleeding, tissue injury, clip migration, and inadequate sampling. Informed written consent was given. The usual time-out protocol was performed immediately prior to the procedure. Site 1: Right breast mass 7 o'clock position (ribbon) Lesion quadrant: Lower outer quadrant Using sterile technique and 1% Lidocaine as local anesthetic, under direct ultrasound visualization, a 14 gauge spring-loaded device was used to perform biopsy of right breast mass 7 ocl using a lateral approach. At the conclusion of the procedure ribbon shaped tissue marker clip was deployed into the biopsy cavity. Follow up 2 view mammogram was performed and dictated separately. Site 2: Right breast mass 9 o'clock position  (coil) Lesion quadrant: Upper outer quadrant Using sterile technique and 1% Lidocaine as local anesthetic, under direct ultrasound visualization, a 14 gauge spring-loaded device was used to perform biopsy of right breast mass 9 ocl using a lateral approach. At the conclusion of the  procedure coil shaped tissue marker clip was deployed into the biopsy cavity. Follow up 2 view mammogram was performed and dictated separately. Site 3: Right axillary node (HydroMARK) Lesion quadrant: Upper outer quadrant Using sterile technique and 1% Lidocaine as local anesthetic, under direct ultrasound visualization, a 14 gauge spring-loaded device was used to perform biopsy of right breast mass 7 ocl using a lateral approach. At the conclusion of the procedure [facility] shaped tissue marker clip was deployed into the biopsy cavity. Follow up 2 view mammogram was performed and dictated separately. IMPRESSION: Ultrasound guided biopsy of right breast masses and right axillary node. No apparent complications. Electronically Signed: By: Lovey Newcomer M.D. On: 07/14/2022 12:22  [place] AXILLARY NODE CORE BIOPSY RIGHT  Addendum Date: 07/20/2022   ADDENDUM REPORT: 07/20/2022 21:44 ADDENDUM: Pathology revealed COMPLEX SCLEROSING LESION WITH USUAL DUCTAL HYPERPLASIA of the RIGHT breast, 7 o'clock, (ribbon clip). This was found to be concordant by Dr. Lovey Newcomer, with excision recommended. Pathology revealed FAT NECROSIS of the RIGHT breast, 9 o'clock (coil clip). This was found to be concordant by Dr. Lovey Newcomer. Pathology revealed BENIGN FIBROADIPOSE TISSUE, NEGATIVE FOR LYMPH NODE TISSUE OR MALIGNANCY of the RIGHT axilla, (hydromark clip). This was found to be discordant by Dr. Lovey Newcomer, due to lack of lymphoid tissue in biopsy sample. Pathology results were discussed with the patient by telephone. The patient reported doing well after the biopsies with tenderness at the sites. Post biopsy instructions and care were reviewed and questions were answered. The patient was encouraged to call [facility] for any additional concerns. My direct phone number was provided. The patient has a recent diagnosis of LEFT breast cancer and should follow her outlined treatment plan. Dr. Erroll Luna was  notified of biopsy results and recommendations via secure EPIC message on July 19, 2022. The patient was asked to return for a RIGHT axillary ultrasound in 3 months and informed a reminder notice would be sent regarding this appointment. Pathology results reported by Terie Purser, RN on 07/20/2022. Electronically Signed   By: Lovey Newcomer M.D.   On: 07/20/2022 21:44   Result Date: 07/20/2022 CLINICAL DATA:  Patient with right breast masses and right axillary lymph node. EXAM: ULTRASOUND GUIDED RIGHT BREAST CORE NEEDLE BIOPSY COMPARISON:  Previous exam(s). PROCEDURE: I met with the patient and we discussed the procedure of ultrasound-guided biopsy, including benefits and alternatives. We  discussed the high likelihood of a successful procedure. We discussed the risks of the procedure, including infection, bleeding, tissue injury, clip migration, and inadequate sampling. Informed written consent was given. The usual time-out protocol was performed immediately prior to the procedure. Site 1: Right breast mass 7 o'clock position (ribbon) Lesion quadrant: Lower outer quadrant Using sterile technique and 1% Lidocaine as local anesthetic, under direct ultrasound visualization, a 14 gauge spring-loaded device was used to perform biopsy of right breast mass 7 ocl using a lateral approach. At the conclusion of the procedure ribbon shaped tissue marker clip was deployed into the biopsy cavity. Follow up 2 view mammogram was performed and dictated separately. Site 2: Right breast mass 9 o'clock position  (coil) Lesion quadrant: Upper outer quadrant Using sterile technique and 1% Lidocaine as local anesthetic, under direct ultrasound visualization, a 14 gauge spring-loaded device was used to perform biopsy of right breast mass 9 ocl using a lateral approach. At the conclusion of the procedure coil shaped tissue marker clip was deployed into the biopsy cavity. Follow up 2 view mammogram was performed and dictated separately. Site  3: Right axillary node (HydroMARK) Lesion quadrant: Upper outer quadrant Using sterile technique and 1% Lidocaine as local anesthetic, under direct ultrasound visualization, a 14 gauge spring-loaded device was used to perform biopsy of right breast mass 7 ocl using a lateral approach. At the conclusion of the procedure [facility] shaped tissue marker clip was deployed into the biopsy cavity. Follow up 2 view mammogram was performed and dictated separately. IMPRESSION: Ultrasound guided biopsy of right breast masses and right axillary node. No apparent complications. Electronically Signed: By: Lovey Newcomer M.D. On: 07/14/2022 12:22  [place] RT BREAST BX W LOC DEV EA ADD LESION IMG BX SPEC US GUIDE  Addendum Date: 07/20/2022   ADDENDUM REPORT: 07/20/2022 21:44 ADDENDUM: Pathology revealed COMPLEX SCLEROSING LESION WITH USUAL DUCTAL HYPERPLASIA of the RIGHT breast, 7 o'clock, (ribbon clip). This was found to be concordant by Dr. Lovey Newcomer, with excision recommended. Pathology revealed FAT NECROSIS of the RIGHT breast, 9 o'clock (coil clip). This was found to be concordant by Dr. Lovey Newcomer. Pathology revealed BENIGN FIBROADIPOSE TISSUE, NEGATIVE FOR LYMPH NODE TISSUE OR MALIGNANCY of the RIGHT axilla, (hydromark clip). This was found to be discordant by Dr. Lovey Newcomer, due to lack of lymphoid tissue in biopsy sample. Pathology results were discussed with the patient by telephone. The patient reported doing well after the biopsies with tenderness at the sites. Post biopsy instructions and care were reviewed and questions were answered. The patient was encouraged to call [facility] for any additional concerns. My direct phone number was provided. The patient has a recent diagnosis of LEFT breast cancer and should follow her outlined treatment plan. Dr. Erroll Luna was notified of biopsy results and recommendations via secure EPIC message on July 19, 2022. The patient was asked to return for  a RIGHT axillary ultrasound in 3 months and informed a reminder notice would be sent regarding this appointment. Pathology results reported by Terie Purser, RN on 07/20/2022. Electronically Signed   By: Lovey Newcomer M.D.   On: 07/20/2022 21:44   Result Date: 07/20/2022 CLINICAL DATA:  Patient with right breast masses and right axillary lymph node. EXAM: ULTRASOUND GUIDED RIGHT BREAST CORE NEEDLE BIOPSY COMPARISON:  Previous exam(s). PROCEDURE: I met with the patient and we discussed the procedure of ultrasound-guided biopsy, including benefits and alternatives. We discussed the high likelihood of a successful procedure. We  discussed the risks of the procedure, including infection, bleeding, tissue injury, clip migration, and inadequate sampling. Informed written consent was given. The usual time-out protocol was performed immediately prior to the procedure. Site 1: Right breast mass 7 o'clock position (ribbon) Lesion quadrant: Lower outer quadrant Using sterile technique and 1% Lidocaine as local anesthetic, under direct ultrasound visualization, a 14 gauge spring-loaded device was used to perform biopsy of right breast mass 7 ocl using a lateral approach. At the conclusion of the procedure ribbon shaped tissue marker clip was deployed into the biopsy cavity. Follow up 2 view mammogram was performed and dictated separately. Site 2: Right breast mass 9 o'clock position  (coil) Lesion quadrant: Upper outer quadrant Using sterile technique and 1% Lidocaine as local anesthetic, under direct ultrasound visualization, a 14 gauge spring-loaded device was used to perform biopsy of right breast mass 9 ocl using a lateral approach. At the conclusion of the procedure coil shaped tissue marker clip was deployed into the biopsy cavity. Follow up 2 view mammogram was performed and dictated separately. Site 3: Right axillary node (HydroMARK) Lesion quadrant: Upper outer quadrant Using sterile technique and 1% Lidocaine as local  anesthetic, under direct ultrasound visualization, a 14 gauge spring-loaded device was used to perform biopsy of right breast mass 7 ocl using a lateral approach. At the conclusion of the procedure [facility] shaped tissue marker clip was deployed into the biopsy cavity. Follow up 2 view mammogram was performed and dictated separately. IMPRESSION: Ultrasound guided biopsy of right breast masses and right axillary node. No apparent complications. Electronically Signed: By: Lovey Newcomer M.D. On: 07/14/2022 12:22  MM CLIP PLACEMENT RIGHT  Result Date: 07/14/2022 CLINICAL DATA:  Right breast masses and right axillary lymph node EXAM: 3D DIAGNOSTIC RIGHT MAMMOGRAM POST ULTRASOUND BIOPSY COMPARISON:  Previous exam(s). FINDINGS: 3D Mammographic images were obtained following ultrasound guided biopsy of right breast masses and right axillary node. Site 1: Right breast mass 7 o'clock position: Ribbon shaped clip in appropriate position Site 2: Right breast mass 9 o'clock position: Coil clip in appropriate position Site 3: Right axillary node: Hydromark clip: Not visualized IMPRESSION: Appropriate positioning of the biopsy marking clips at the site of biopsy in the right breast. Final Assessment: Post Procedure Mammograms for Marker Placement Electronically Signed   By: Lovey Newcomer M.D.   On: 07/14/2022 12:29  MR BREAST BILATERAL W WO CONTRAST INC CAD  Result Date: 07/11/2022 CLINICAL DATA:  Recent ultrasound-guided biopsy for a LEFT breast mass revealing DCIS. EXAM: BILATERAL BREAST MRI WITH AND WITHOUT CONTRAST TECHNIQUE: Multiplanar, multisequence MR images of both breasts were obtained prior to and following the intravenous administration of 10 ml of Vueway Three-dimensional MR images were rendered by post-processing of the original MR data on an independent workstation. The three-dimensional MR images were interpreted, and findings are reported in the following complete MRI report for this study. Three  dimensional images were evaluated at the independent interpreting workstation using the DynaCAD thin client. COMPARISON:  No previous breast MRI. Comparison is made to bilateral diagnostic mammogram and ultrasound dated 07/06/2022 and ultrasound-guided biopsy dated 07/04/2022. FINDINGS: Breast composition: b. Scattered fibroglandular tissue. Background parenchymal enhancement: Moderate to marked Right breast: Irregular enhancing mass within the lower RIGHT breast, 6-7 o'clock axis region, measuring 9 mm greatest dimension (series 6, image 119). Focal non-mass enhancement within the lower outer quadrant to 9 o'clock axis of the RIGHT breast, at anterior depth measuring approximately 1.3 cm extent (series 6, image 114). Left breast: Biopsy site  marker within the outer periareolar LEFT breast (3 o'clock axis, 1 cm from the nipple, per earlier ultrasound localization), with surrounding non mass enhancement measuring 8 mm, corresponding to the biopsy-proven DCIS (series 6, image 81). Additional segmental non-mass enhancement extends from the adjacent subareolar LEFT breast to middle depth, lower outer quadrant, measuring 6.2 cm extent, suspicious for additional extent of disease (series 6, images 81 through 86). Lymph nodes: Single enlarged/morphologically abnormal lymph node in the RIGHT axilla (series 6, image 40). Two enlarged/morphologically abnormal lymph nodes are seen within the LEFT axilla, 1 of which contains a biopsy clip from recent axillary lymph node biopsy which was negative for carcinoma. Ancillary findings:  None. IMPRESSION: 1. 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 2. Additional segmental non-mass enhancement extending from the adjacent subareolar LEFT breast to middle depth, lower outer quadrant, measuring 6.2 cm extent, suspicious for additional extent of  disease. MRI-guided biopsies are recommended, with targeting of this enhancement at its middle/central and posterior aspect (series 6, images 84 to 86). 3. Focal non-mass enhancement within the lower outer quadrant to 9 o'clock axis of the RIGHT breast, at anterior depth, measuring approximately 1.3 cm extent. A likely sonographic correlate was identified on RIGHT breast ultrasound of 07/06/2022, scheduled for ultrasound-guided biopsy on 07/14/2022. 4. Additional irregular enhancing mass within the lower RIGHT breast, 6-7 o'clock axis region, measuring 9 mm. A likely sonographic correlate was identified on RIGHT breast ultrasound of 07/06/2022, scheduled for ultrasound-guided biopsy on 07/14/2022. 5. Single enlarged/morphologically abnormal lymph node in the RIGHT axilla. This is also scheduled for ultrasound-guided biopsy on 07/14/2022. RECOMMENDATION: 1. Patient is scheduled on December 29th for ultrasound-guided biopsies of RIGHT breast masses at the 9 o'clock axis and 7 o'clock axis, and also for a morphologically abnormal lymph node in the RIGHT axilla. These breast masses are likely sonographic correlates for the focal non-mass enhancement and the irregular enhancing mass seen on today's MRI within the RIGHT breast. If ultrasound-guided biopsy results are benign, recommend a follow-up breast MRI in 6 months per protocol. 2. If breast conservation surgery is considered for the LEFT breast, recommend MRI-guided biopsies for the suspicious non-mass enhancement adjacent to and extending posterior to patient's biopsy-proven DCIS within the LEFT breast, measuring 6.2 cm extent, with 2-site biopsy targeting of the enhancement at its middle/central and posterior aspects (series 6, images 84-86). BI-RADS CATEGORY  4: Suspicious. Electronically Signed   By: Franki Cabot M.D.   On: 07/11/2022 08:09  MM DIAG BREAST TOMO BILATERAL  Addendum Date: 07/08/2022   ADDENDUM REPORT: 07/08/2022 09:09 ADDENDUM: ERROR IN  REPORT: Impression incorrectly states that earlier ultrasound evaluation of the LEFT axilla showed no enlarged or morphologically abnormal lymph nodes in the LEFT axilla. This is not the case. At the time of patient's ultrasound-guided biopsy for a LEFT breast mass on 07/04/2022 which demonstrated DCIS, preprocedure ultrasound of the LEFT axilla was also performed and showed multiple abnormal-appearing LEFT axillary lymph nodes with cortex measurement up to 6 mm. As such, ultrasound-guided biopsy was performed for 1 of the thickened lymph nodes. This ultrasound-guided biopsy of a LEFT axillary lymph node showed no evidence of malignancy. Electronically Signed   By: Franki Cabot M.D.   On: 07/08/2022 09:09   Result Date: 07/08/2022 CLINICAL DATA:  Recent ultrasound-guided biopsy for a LEFT breast mass revealing DCIS. Patient returns today for a bilateral diagnostic mammogram  to exclude multifocal or contralateral disease. Patient has breast MRI scheduled tomorrow. EXAM: DIGITAL DIAGNOSTIC BILATERAL MAMMOGRAM WITH TOMOSYNTHESIS; ULTRASOUND LEFT BREAST LIMITED; ULTRASOUND RIGHT BREAST LIMITED TECHNIQUE: Bilateral digital diagnostic mammography and breast tomosynthesis was performed.; Targeted ultrasound examination of the left breast was performed.; Targeted ultrasound examination of the right breast was performed COMPARISON:  Previous exam(s). ACR Breast Density Category b: There are scattered areas of fibroglandular density. FINDINGS: Bilateral diagnostic mammogram: Coil shaped biopsy clip within the anterior LEFT breast corresponds to patient's recent ultrasound-guided biopsy revealing DCIS. Surrounding fibroglandular tissues appear normal. No associated microcalcifications at the site of biopsy-proven DCIS. Initially questioned asymmetry within the retroareolar LEFT breast, at posterior depth, is compatible with superimposition of normal fibroglandular tissues on spot compression views. Ultrasound will be  performed of this area to ensure benignity. Also, initially questioned asymmetry within the retroareolar RIGHT breast, slightly outer, at posterior depth, is resolved on spot compression views indicating superimposition of normal fibroglandular tissues. Ultrasound will also be performed of this area to ensure benignity. LEFT breast: Targeted ultrasound is performed, evaluating the retroareolar to slightly outer LEFT breast, showing only normal fibroglandular tissues and fat lobules throughout. No solid or cystic mass. RIGHT breast: Targeted ultrasound is performed, showing an oval hypoechoic mass in the RIGHT breast at the 9 o'clock axis, 8 cm from the nipple, with anti parallel orientation, with echogenic rim, without internal vascularity, measuring 4 x 3 x 3 mm. There is an additional irregular hypoechoic mass in the RIGHT breast at the 7 o'clock axis, 3 cm from the nipple, without internal vascularity, measuring 5 x 4 x 4 mm. There is a single morphologically abnormal lymph node in the RIGHT axilla, with cortical thickness measuring 6 mm. IMPRESSION: 1. Indeterminate oval hypoechoic mass in the RIGHT breast at the 9 o'clock axis, 8 cm from the nipple, with echogenic rim, measuring 4 mm. Ultrasound-guided biopsy is recommended to exclude contralateral malignancy. 2. Additional indeterminate irregular hypoechoic mass in the RIGHT breast at the 7 o'clock axis, 3 cm from the nipple, measuring 5 mm. Ultrasound-guided biopsy is recommended to exclude contralateral malignancy. 3. Morphologically abnormal lymph node in the RIGHT axilla, with cortical thickness measuring 6 mm. Ultrasound-guided biopsy is recommended to exclude lymph node metastasis. 4. Earlier ultrasound evaluation of the LEFT axilla showed no enlarged or morphologically abnormal lymph nodes in the LEFT axilla. RECOMMENDATION: 1. Ultrasound-guided biopsy for the RIGHT breast mass at the 9 o'clock axis. 2. Ultrasound-guided biopsy for the RIGHT breast mass  at the 7 o'clock axis. 3. Ultrasound-guided biopsy of the single morphologically abnormal lymph node in the RIGHT axilla. Ultrasound-guided biopsies are scheduled on December 29th. I have discussed the findings and recommendations with the patient. If applicable, a reminder letter will be sent to the patient regarding the next appointment. BI-RADS CATEGORY  4: Suspicious. Electronically Signed: By: Franki Cabot M.D. On: 07/06/2022 16:55  US BREAST LTD UNI RIGHT INC AXILLA  Addendum Date: 07/08/2022   ADDENDUM REPORT: 07/08/2022 09:09 ADDENDUM: ERROR IN REPORT: Impression incorrectly states that earlier ultrasound evaluation of the LEFT axilla showed no enlarged or morphologically abnormal lymph nodes in the LEFT axilla. This is not the case. At the time of patient's ultrasound-guided biopsy for a LEFT breast mass on 07/04/2022 which demonstrated DCIS, preprocedure ultrasound of the LEFT axilla was also performed and showed multiple abnormal-appearing LEFT axillary lymph nodes with cortex measurement up to 6 mm. As such, ultrasound-guided biopsy was performed for 1 of the thickened lymph nodes. This ultrasound-guided  biopsy of a LEFT axillary lymph node showed no evidence of malignancy. Electronically Signed   By: Franki Cabot M.D.   On: 07/08/2022 09:09   Result Date: 07/08/2022 CLINICAL DATA:  Recent ultrasound-guided biopsy for a LEFT breast mass revealing DCIS. Patient returns today for a bilateral diagnostic mammogram to exclude multifocal or contralateral disease. Patient has breast MRI scheduled tomorrow. EXAM: DIGITAL DIAGNOSTIC BILATERAL MAMMOGRAM WITH TOMOSYNTHESIS; ULTRASOUND LEFT BREAST LIMITED; ULTRASOUND RIGHT BREAST LIMITED TECHNIQUE: Bilateral digital diagnostic mammography and breast tomosynthesis was performed.; Targeted ultrasound examination of the left breast was performed.; Targeted ultrasound examination of the right breast was performed COMPARISON:  Previous exam(s). ACR Breast  Density Category b: There are scattered areas of fibroglandular density. FINDINGS: Bilateral diagnostic mammogram: Coil shaped biopsy clip within the anterior LEFT breast corresponds to patient's recent ultrasound-guided biopsy revealing DCIS. Surrounding fibroglandular tissues appear normal. No associated microcalcifications at the site of biopsy-proven DCIS. Initially questioned asymmetry within the retroareolar LEFT breast, at posterior depth, is compatible with superimposition of normal fibroglandular tissues on spot compression views. Ultrasound will be performed of this area to ensure benignity. Also, initially questioned asymmetry within the retroareolar RIGHT breast, slightly outer, at posterior depth, is resolved on spot compression views indicating superimposition of normal fibroglandular tissues. Ultrasound will also be performed of this area to ensure benignity. LEFT breast: Targeted ultrasound is performed, evaluating the retroareolar to slightly outer LEFT breast, showing only normal fibroglandular tissues and fat lobules throughout. No solid or cystic mass. RIGHT breast: Targeted ultrasound is performed, showing an oval hypoechoic mass in the RIGHT breast at the 9 o'clock axis, 8 cm from the nipple, with anti parallel orientation, with echogenic rim, without internal vascularity, measuring 4 x 3 x 3 mm. There is an additional irregular hypoechoic mass in the RIGHT breast at the 7 o'clock axis, 3 cm from the nipple, without internal vascularity, measuring 5 x 4 x 4 mm. There is a single morphologically abnormal lymph node in the RIGHT axilla, with cortical thickness measuring 6 mm. IMPRESSION: 1. Indeterminate oval hypoechoic mass in the RIGHT breast at the 9 o'clock axis, 8 cm from the nipple, with echogenic rim, measuring 4 mm. Ultrasound-guided biopsy is recommended to exclude contralateral malignancy. 2. Additional indeterminate irregular hypoechoic mass in the RIGHT breast at the 7 o'clock axis, 3  cm from the nipple, measuring 5 mm. Ultrasound-guided biopsy is recommended to exclude contralateral malignancy. 3. Morphologically abnormal lymph node in the RIGHT axilla, with cortical thickness measuring 6 mm. Ultrasound-guided biopsy is recommended to exclude lymph node metastasis. 4. Earlier ultrasound evaluation of the LEFT axilla showed no enlarged or morphologically abnormal lymph nodes in the LEFT axilla. RECOMMENDATION: 1. Ultrasound-guided biopsy for the RIGHT breast mass at the 9 o'clock axis. 2. Ultrasound-guided biopsy for the RIGHT breast mass at the 7 o'clock axis. 3. Ultrasound-guided biopsy of the single morphologically abnormal lymph node in the RIGHT axilla. Ultrasound-guided biopsies are scheduled on December 29th. I have discussed the findings and recommendations with the patient. If applicable, a reminder letter will be sent to the patient regarding the next appointment. BI-RADS CATEGORY  4: Suspicious. Electronically Signed: By: Franki Cabot M.D. On: 07/06/2022 16:55  US BREAST LTD UNI LEFT INC AXILLA  Addendum Date: 07/08/2022   ADDENDUM REPORT: 07/08/2022 09:09 ADDENDUM: ERROR IN REPORT: Impression incorrectly states that earlier ultrasound evaluation of the LEFT axilla showed no enlarged or morphologically abnormal lymph nodes in the LEFT axilla. This is not the case. At the time of  patient's ultrasound-guided biopsy for a LEFT breast mass on 07/04/2022 which demonstrated DCIS, preprocedure ultrasound of the LEFT axilla was also performed and showed multiple abnormal-appearing LEFT axillary lymph nodes with cortex measurement up to 6 mm. As such, ultrasound-guided biopsy was performed for 1 of the thickened lymph nodes. This ultrasound-guided biopsy of a LEFT axillary lymph node showed no evidence of malignancy. Electronically Signed   By: Franki Cabot M.D.   On: 07/08/2022 09:09   Result Date: 07/08/2022 CLINICAL DATA:  Recent ultrasound-guided biopsy for a LEFT breast mass  revealing DCIS. Patient returns today for a bilateral diagnostic mammogram to exclude multifocal or contralateral disease. Patient has breast MRI scheduled tomorrow. EXAM: DIGITAL DIAGNOSTIC BILATERAL MAMMOGRAM WITH TOMOSYNTHESIS; ULTRASOUND LEFT BREAST LIMITED; ULTRASOUND RIGHT BREAST LIMITED TECHNIQUE: Bilateral digital diagnostic mammography and breast tomosynthesis was performed.; Targeted ultrasound examination of the left breast was performed.; Targeted ultrasound examination of the right breast was performed COMPARISON:  Previous exam(s). ACR Breast Density Category b: There are scattered areas of fibroglandular density. FINDINGS: Bilateral diagnostic mammogram: Coil shaped biopsy clip within the anterior LEFT breast corresponds to patient's recent ultrasound-guided biopsy revealing DCIS. Surrounding fibroglandular tissues appear normal. No associated microcalcifications at the site of biopsy-proven DCIS. Initially questioned asymmetry within the retroareolar LEFT breast, at posterior depth, is compatible with superimposition of normal fibroglandular tissues on spot compression views. Ultrasound will be performed of this area to ensure benignity. Also, initially questioned asymmetry within the retroareolar RIGHT breast, slightly outer, at posterior depth, is resolved on spot compression views indicating superimposition of normal fibroglandular tissues. Ultrasound will also be performed of this area to ensure benignity. LEFT breast: Targeted ultrasound is performed, evaluating the retroareolar to slightly outer LEFT breast, showing only normal fibroglandular tissues and fat lobules throughout. No solid or cystic mass. RIGHT breast: Targeted ultrasound is performed, showing an oval hypoechoic mass in the RIGHT breast at the 9 o'clock axis, 8 cm from the nipple, with anti parallel orientation, with echogenic rim, without internal vascularity, measuring 4 x 3 x 3 mm. There is an additional irregular hypoechoic  mass in the RIGHT breast at the 7 o'clock axis, 3 cm from the nipple, without internal vascularity, measuring 5 x 4 x 4 mm. There is a single morphologically abnormal lymph node in the RIGHT axilla, with cortical thickness measuring 6 mm. IMPRESSION: 1. Indeterminate oval hypoechoic mass in the RIGHT breast at the 9 o'clock axis, 8 cm from the nipple, with echogenic rim, measuring 4 mm. Ultrasound-guided biopsy is recommended to exclude contralateral malignancy. 2. Additional indeterminate irregular hypoechoic mass in the RIGHT breast at the 7 o'clock axis, 3 cm from the nipple, measuring 5 mm. Ultrasound-guided biopsy is recommended to exclude contralateral malignancy. 3. Morphologically abnormal lymph node in the RIGHT axilla, with cortical thickness measuring 6 mm. Ultrasound-guided biopsy is recommended to exclude lymph node metastasis. 4. Earlier ultrasound evaluation of the LEFT axilla showed no enlarged or morphologically abnormal lymph nodes in the LEFT axilla. RECOMMENDATION: 1. Ultrasound-guided biopsy for the RIGHT breast mass at the 9 o'clock axis. 2. Ultrasound-guided biopsy for the RIGHT breast mass at the 7 o'clock axis. 3. Ultrasound-guided biopsy of the single morphologically abnormal lymph node in the RIGHT axilla. Ultrasound-guided biopsies are scheduled on December 29th. I have discussed the findings and recommendations with the patient. If applicable, a reminder letter will be sent to the patient regarding the next appointment. BI-RADS CATEGORY  4: Suspicious. Electronically Signed: By: Franki Cabot M.D. On: 07/06/2022 16:55  IMPRESSION/PLAN: This is a wonderful 27 year old woman with new diagnosis of DCIS of the left breast  She has not yet discussed the results to her MRI with Dr. Brantley Stage.  She is hoping that she will be a candidate for breast conserving surgery.  She has a consultation with medical oncology later today.  She knows to ask about the safety of hormonal birth control  options with medical oncology.  I have messaged Dr. Brantley Stage and Dr. Lindi Adie about her care today.  It was a pleasure meeting the patient today. We discussed the risks, benefits, and side effects of radiotherapy.  If she undergoes lumpectomy for her DCIS, I recommend adjuvant radiotherapy to the left breast to reduce her risk of locoregional recurrence.   We discussed that radiation would take no more than 6 weeks to complete and that I would give the patient a few weeks to heal following surgery before starting treatment planning. We spoke about acute effects including skin irritation and fatigue as well as much less common late effects including internal organ injury or irritation. We spoke about the latest technology that is used to minimize the risk of late effects for patients undergoing radiotherapy to the breast or chest wall.  We spoke about the risk of secondary malignancy, and that this risk is far outweighed by the risk of cancer recurrence related to her current diagnosis.  In other words, the potential benefits of radiation therapy outweigh the risks. No guarantees of treatment were given. The patient is enthusiastic about proceeding with treatment. I look forward to participating in the patient's care.  I will await her referral back to me for postoperative follow-up and eventual CT simulation/treatment planning.  She currently smokes (non-tobacco, non-nicotine source) and I strongly advised her to quit.  She understands that smoking can make it more difficult for her to heal from oncologic therapy and can increase the risk of lung cancer after radiation therapy.  On date of service, in total, I spent 60 minutes on this encounter. Patient was seen in person.   __________________________________________   Eppie Gibson, MD  This document serves as a record of services personally performed by Eppie Gibson, MD. It was created on her behalf by Roney Mans, a trained medical scribe. The creation  of this record is based on the scribe's personal observations and the provider's statements to them. This document has been checked and approved by the attending provider.

## 2022-08-04 ENCOUNTER — Other Ambulatory Visit: Payer: Self-pay

## 2022-08-04 ENCOUNTER — Telehealth: Payer: Self-pay

## 2022-08-04 ENCOUNTER — Ambulatory Visit
Admission: RE | Admit: 2022-08-04 | Discharge: 2022-08-04 | Disposition: A | Discharge status: Home / Self Care | Payer: Self-pay | Source: Ambulatory Visit | Attending: Radiation Oncology | Admitting: Radiation Oncology

## 2022-08-04 ENCOUNTER — Encounter: Payer: Self-pay | Admitting: Radiation Oncology

## 2022-08-04 ENCOUNTER — Telehealth: Payer: Self-pay | Admitting: Genetic Counselor

## 2022-08-04 ENCOUNTER — Inpatient Hospital Stay (HOSPITAL_BASED_OUTPATIENT_CLINIC_OR_DEPARTMENT_OTHER): Payer: Self-pay | Admitting: Hematology and Oncology

## 2022-08-04 VITALS — BP 132/76 | HR 86 | Temp 96.1°F | Resp 18 | Ht 67.0 in | Wt 234.2 lb

## 2022-08-04 VITALS — BP 123/73 | HR 79 | Temp 97.5°F | Resp 15 | Wt 234.8 lb

## 2022-08-04 DIAGNOSIS — D0512 Intraductal carcinoma in situ of left breast: Secondary | ICD-10-CM | POA: Insufficient documentation

## 2022-08-04 DIAGNOSIS — D649 Anemia, unspecified: Secondary | ICD-10-CM | POA: Insufficient documentation

## 2022-08-04 NOTE — Telephone Encounter (Signed)
Left message on voicemail requesting return regarding [employer] application.

## 2022-08-04 NOTE — Assessment & Plan Note (Signed)
07/04/22: Patient presented with mastitis and palpable abnormality. 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 It is considered a precancerous lesion. DCIS is classified as a 0. It is generally detected through mammograms as calcifications. We discussed the significance of grades and its impact on prognosis. We also discussed the importance of ER and PR receptors and their implications to adjuvant treatment options. Prognosis of DCIS dependence on grade, comedo necrosis. It is anticipated that if not treated, 20-30% of DCIS can develop into invasive breast cancer.  Recommendation: Genetics 1. Breast conserving surgery 2. Followed by adjuvant radiation therapy 3. Followed by antiestrogen therapy with tamoxifen 5 years  Tamoxifen counseling: We discussed the risks and benefits of tamoxifen. These include but not limited to insomnia, hot flashes, mood changes, vaginal dryness, and weight gain. Although rare, serious side effects including endometrial cancer, risk of blood clots were also discussed. We strongly believe that the benefits far outweigh the risks. Patient understands these risks and consented to starting treatment. Planned treatment duration is 5 years.  Return to clinic after surgery to discuss the final pathology report and come up with an adjuvant treatment plan.

## 2022-08-04 NOTE — Progress Notes (Signed)
[facility] CONSULT NOTE  Patient Care Team: Patient, No Pcp Per as PCP - General (General Practice)  CHIEF COMPLAINTS/PURPOSE OF CONSULTATION:  Newly diagnosed left breast DCIS  HISTORY OF PRESENTING ILLNESS:  Erin Jacobs 27 y.o. female is here because of recent diagnosis of left breast DCIS.  Patient initially presented with what appears to be mastitis and a palpable abnormality.  Biopsy of this abnormality revealed intermediate to high-grade DCIS that was ER/PR positive.  She had abnormalities in the right breast which on biopsy came back as complex grossing lesion with UDH and fat necrosis.  She was referred to [place] for discussion regarding adjuvant treatment options.  I reviewed her records extensively and collaborated the history with the patient.  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 Biopsy revealed IG-HG DCIS, ER 70%, PR 100% Right Breast Biopsy: 7 o clock: CSL with UDH; 9 o clock: Fat necrosis, Right axill LN: Neg      MEDICAL HISTORY:  Past Medical History:  Diagnosis Date   Anemia    Anxiety    Familial hypospadias of penis 10/23/2017   Family history of prostate cancer    Intraductal carcinoma in situ of left breast 07/26/2022    SURGICAL HISTORY: Past Surgical History:  Procedure Laterality Date   BREAST BIOPSY Left 07/04/2022   [place] LT BREAST BX W LOC DEV 1ST LESION IMG BX SPEC US GUIDE 07/04/2022 [facility]   BREAST BIOPSY Right 07/14/2022   times 3   BREAST BIOPSY Right 07/14/2022   [place] RT BREAST BX W LOC DEV 1ST LESION IMG BX SPEC US GUIDE 07/14/2022 [facility]   BREAST BIOPSY Right 07/14/2022   [place] RT BREAST BX W LOC DEV EA ADD LESION IMG BX SPEC US GUIDE 07/14/2022 [facility]   leg suregry- childhood      SOCIAL HISTORY: Social History   Socioeconomic History    Marital status: Single    Spouse name: Not on file   Number of children: 4   Years of education: Not on file   Highest education level: Not on file  Occupational History   Occupation: [employer]  Tobacco Use   Smoking status: Never   Smokeless tobacco: Never  Vaping Use   Vaping Use: Former  Substance and Sexual Activity   Alcohol use: No   Drug use: Not Currently   Sexual activity: Yes    Birth control/protection: Implant    Comment: Nexplanon  Other Topics Concern   Not on file  Social History Narrative   Not on file   Social Determinants of Health   Financial Resource Strain: Not on file  Food Insecurity: No Food Insecurity (06/22/2022)   Hunger Vital Sign    Worried About Running Out of Food in the Last Year: Never true    Ran Out of Food in the Last Year: Never true  Transportation Needs: No Transportation Needs (06/22/2022)   PRAPARE - Hydrologist (Medical): No    Lack of Transportation (Non-Medical): No  Physical Activity: Not on file  Stress: Not on file  Social Connections: Not on file  Intimate Partner Violence: Not on file    FAMILY HISTORY: Family History  Problem Relation Age of Onset   Cervical cancer Mother 27   Heart disease Father    Brain cancer Maternal Grandmother 8  Heart attack Maternal Grandfather    Diabetes Maternal Aunt    Diabetes Maternal Uncle    Hypertension Maternal Uncle    Stroke Maternal Uncle    Prostate cancer Other        MGMs brother   Throat cancer Other        MGMs brother    ALLERGIES:  has No Known Allergies.  MEDICATIONS:  Current Outpatient Medications  Medication Sig Dispense Refill   ibuprofen (ADVIL) 600 MG tablet Take 1 tablet (600 mg total) by mouth every 8 (eight) hours as needed. (Patient not taking: Reported on 06/22/2022) 30 tablet 0   No current facility-administered medications for this visit.    REVIEW OF SYSTEMS:   Constitutional: Denies fevers, chills or abnormal  night sweats   All other systems were reviewed with the patient and are negative.  PHYSICAL EXAMINATION: ECOG PERFORMANCE STATUS: 1 - Symptomatic but completely ambulatory  Vitals:   08/04/22 1013  BP: 123/73  Pulse: 79  Resp: 15  Temp: (!) 97.5 F (36.4 C)  SpO2: 96%   Filed Weights   08/04/22 1013  Weight: 234 lb 12.8 oz (106.5 kg)    GENERAL:alert, no distress and comfortable    LABORATORY DATA:  I have reviewed the data as listed Lab Results  Component Value Date   WBC 7.4 01/17/2021   HGB 9.6 (L) 01/17/2021   HCT 33.4 (L) 01/17/2021   MCV 78.2 (L) 01/17/2021   PLT 333 01/17/2021   Lab Results  Component Value Date   NA 138 10/15/2020   K 3.9 10/15/2020   CL 105 10/15/2020   CO2 18 (L) 10/15/2020    RADIOGRAPHIC STUDIES: I have personally reviewed the radiological reports and agreed with the findings in the report.  ASSESSMENT AND PLAN:  Intraductal carcinoma in situ of left breast 07/04/22: Patient presented with mastitis and palpable abnormality. 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 It is considered a precancerous lesion. DCIS is classified as a 0. It is generally detected through mammograms as calcifications. We discussed the significance of grades and its impact on prognosis. We also discussed the importance of ER and PR receptors and their implications to adjuvant treatment options. Prognosis of DCIS dependence on grade, comedo necrosis. It is anticipated that if not treated, 20-30% of DCIS can develop into invasive breast cancer.  Recommendation: Genetics 1. Breast conserving surgery (she is likely to get bilateral lumpectomies) 2. Followed by adjuvant radiation therapy 3. Followed by antiestrogen therapy with tamoxifen 5 years  Tamoxifen counseling:  We discussed the risks and benefits of tamoxifen. These include but not limited to insomnia, hot flashes, mood changes, vaginal dryness, and weight gain. Although rare, serious side effects including endometrial cancer, risk of blood clots were also discussed. We strongly believe that the benefits far outweigh the risks. Patient understands these risks and consented to starting treatment. Planned treatment duration is 5 years.  Patient works as a Librarian, academic at [employer].  She has 4 children 3 sons and baby girl aged 52 years. Return to clinic after surgery to discuss the final pathology report and come up with an adjuvant treatment plan.      All questions were answered. The patient knows to call the clinic with any problems, questions or concerns.  Harriette Ohara, MD 08/04/22

## 2022-08-04 NOTE — Telephone Encounter (Signed)
LM on VM that results are back and to please call.

## 2022-08-07 ENCOUNTER — Encounter: Payer: Self-pay | Admitting: *Deleted

## 2022-08-07 ENCOUNTER — Ambulatory Visit: Payer: Self-pay | Admitting: Genetic Counselor

## 2022-08-07 DIAGNOSIS — Z1379 Encounter for other screening for genetic and chromosomal anomalies: Secondary | ICD-10-CM

## 2022-08-07 NOTE — Progress Notes (Signed)
HPI:  Ms. Sebastiani was previously seen in the [place] clinic due to a personal history of breast cancer and concerns regarding a hereditary predisposition to cancer. Please refer to our prior cancer genetics clinic note for more information regarding our discussion, assessment and recommendations, at the time. Ms. Bartles recent genetic test results were disclosed to her, as were recommendations warranted by these results. These results and recommendations are discussed in more detail below.  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 Biopsy revealed IG-HG DCIS, ER 70%, PR 100% Right Breast Biopsy: 7 o clock: CSL with UDH; 9 o clock: Fat necrosis, Right axill LN: Neg   08/03/2022 Genetic Testing   Negative genetic testing on the CancerNext+RNAinsight panel.  The report date is August 02, 2022.  The CancerNext gene panel offered by [employer] includes sequencing and rearrangement analysis for the following 34 genes:   APC, ATM, BARD1, BMPR1A, BRCA1, BRCA2, BRIP1, CDH1, CDK4, CDKN2A, CHEK2, DICER1, HOXB13, EPCAM, GREM1, MLH1, MRE11A, MSH2, MSH6, MUTYH, NBN, NF1, PALB2, PMS2, POLD1, POLE, PTEN, RAD50, RAD51C, RAD51D, SMAD4, SMARCA4, STK11, and TP53.       FAMILY HISTORY:  We obtained a detailed, 4-generation family history.  Significant diagnoses are listed below: Family History  Problem Relation Age of Onset   Cervical cancer Mother 15   Heart disease Father    Brain cancer Maternal Grandmother 71   Heart attack Maternal Grandfather    Diabetes Maternal Aunt    Diabetes Maternal Uncle    Hypertension Maternal Uncle    Stroke Maternal Uncle    Prostate cancer Other        MGMs brother   Throat cancer Other        MGMs brother       The patient has three sons and a daughter who are cancer free.  She has two maternal half brothers and  five sisters and two paternal half brothers and 2 sisters who are all cancer free.  Her father is deceased and her mother is living.   The patient's mother had cervical cancer at age 59. She has three brothers and a sister who are cancer free.  Her mother had brain cancer at 50. Her mother had one brother with prostate cancer and one with throat cancer.   The patient's father died of heart disease.  The patient does not have additional information about his family.   Ms. Winther is unaware of previous family history of genetic testing for hereditary cancer risks. Patient's maternal ancestors are of African American descent, and paternal ancestors are of African American descent. There is no reported Ashkenazi Jewish ancestry. There is no known consanguinity.  GENETIC TEST RESULTS: Genetic testing reported out on August 02, 2022 through the [facility] cancer panel found no pathogenic mutations. The CancerNext gene panel offered by [employer] includes sequencing and rearrangement analysis for the following 34 genes:   APC, ATM, BARD1, BMPR1A, BRCA1, BRCA2, BRIP1, CDH1, CDK4, CDKN2A, CHEK2, DICER1, HOXB13, EPCAM, GREM1, MLH1, MRE11A, MSH2, MSH6, MUTYH, NBN, NF1, PALB2, PMS2, POLD1, POLE, PTEN, RAD50, RAD51C, RAD51D, SMAD4, SMARCA4, STK11, and TP53.. The test report has been scanned into EPIC and is located under the Molecular Pathology section of the Results Review tab.  A portion of the result report is included below for reference.     We discussed with Ms. Portal that because current genetic testing  is not perfect, it is possible there may be a gene mutation in one of these genes that current testing cannot detect, but that chance is small.  We also discussed, that there could be another gene that has not yet been discovered, or that we have not yet tested, that is responsible for the cancer diagnoses in the family. It is also possible there is a hereditary cause for the cancer in the family that  Ms. Warren did not inherit and therefore was not identified in her testing.  Therefore, it is important to remain in touch with cancer genetics in the future so that we can continue to offer Ms. Rood the most up to date genetic testing.   ADDITIONAL GENETIC TESTING: We discussed with Ms. Gilmer that her genetic testing was fairly extensive.  If there are genes identified to increase cancer risk that can be analyzed in the future, we would be happy to discuss and coordinate this testing at that time.    CANCER SCREENING RECOMMENDATIONS: Ms. Vanstone test result is considered negative (normal).  This means that we have not identified a hereditary cause for her personal history of breast cancer at this time. Most cancers happen by chance and this negative test suggests that her cancer may fall into this category.    While reassuring, this does not definitively rule out a hereditary predisposition to cancer. It is still possible that there could be genetic mutations that are undetectable by current technology. There could be genetic mutations in genes that have not been tested or identified to increase cancer risk.  Therefore, it is recommended she continue to follow the cancer management and screening guidelines provided by her oncology and primary healthcare provider.   An individual's cancer risk and medical management are not determined by genetic test results alone. Overall cancer risk assessment incorporates additional factors, including personal medical history, family history, and any available genetic information that may result in a personalized plan for cancer prevention and surveillance  RECOMMENDATIONS FOR FAMILY MEMBERS:  Individuals in this family might be at some increased risk of developing cancer, over the general population risk, simply due to the family history of cancer.  We recommended women in this family have a yearly mammogram beginning at age 20, or 26 years younger than the  earliest onset of cancer, an annual clinical breast exam, and perform monthly breast self-exams. Women in this family should also have a gynecological exam as recommended by their primary provider. All family members should be referred for colonoscopy starting at age 70.  FOLLOW-UP: Lastly, we discussed with Ms. Fehrman that cancer genetics is a rapidly advancing field and it is possible that new genetic tests will be appropriate for her and/or her family members in the future. We encouraged her to remain in contact with cancer genetics on an annual basis so we can update her personal and family histories and let her know of advances in cancer genetics that may benefit this family.   Our contact number was provided. Ms. Goding questions were answered to her satisfaction, and she knows she is welcome to call us at anytime with additional questions or concerns.   Roma Kayser, [place], [facility] Licensed, Certified Genetic Counselor [email or web address]

## 2022-08-07 NOTE — Telephone Encounter (Signed)
Revealed negative genetic testing.  Discussed that we do not know why she has breast cancer. It could be due to a different gene that we are not testing, or maybe our current technology may not be able to pick something up.  It will be important for her to keep in contact with genetics to keep up with whether additional testing may be needed.

## 2022-08-10 ENCOUNTER — Inpatient Hospital Stay: Payer: Self-pay | Admitting: Licensed Clinical Social Worker

## 2022-08-10 NOTE — Progress Notes (Signed)
[facility]  Initial Assessment   Erin Jacobs is a 27 y.o. year old female contacted by phone. Clinical Social Work was referred by new patient protocol for assessment of psychosocial needs.   SDOH (Social Determinants of Health) assessments performed: Yes SDOH Interventions    Flowsheet Row Clinical Support from 08/10/2022 in [facility] at [facility] Interventions Intervention Not Indicated  Financial Strain Interventions Other (Comment)  [employer]]       SDOH Screenings   Food Insecurity: No Food Insecurity (06/22/2022)  Housing: Low Risk  (08/10/2022)  Transportation Needs: No Transportation Needs (06/22/2022)  Depression (PHQ2-9): Low Risk  (05/24/2020)  Financial Resource Strain: Medium Risk (08/10/2022)  Tobacco Use: Low Risk  (08/04/2022)     Distress Screen completed: Yes    08/04/2022    8:55 AM  ONCBCN DISTRESS SCREENING  Screening Type Initial Screening  Distress experienced in past week (1-10) 0      Family/Social Information:  Housing Arrangement: patient lives with her kids ages 36, 46, 21, 72yo Family members/support persons in your life? Family and Friends. 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  Income source: Employment Financial concerns: Yes, due to illness and/or loss of work during treatment Type of concern: Rent/ [place] access concerns: no Religious or spiritual practice: Not known Services Currently in place:    Coping/ Adjustment to diagnosis: Patient understands treatment plan and what happens next? yes Patient reported stressors: Finances Current coping skills/ strengths: Capable of independent living , Motivation for treatment/growth , and Supportive family/friends     SUMMARY: Current SDOH Barriers:  Financial constraints related to loss of income during  treatment  Clinical Social Work Clinical Goal(s):  Patient will work with SW to address concerns related to financial strain  Interventions: Discussed common feeling and emotions when being diagnosed with cancer, and the importance of support during treatment Informed patient of the support team roles and support services at [employer] Provided [name] contact information and encouraged patient to call with any questions or concerns Provided patient with information about cancer foundations for financial assistance. Mailed to pt today   Follow Up Plan: Patient will review applications mailed to her today and contact CSW with questions or for help applying Patient verbalizes understanding of plan: Yes    Erin Ruddy [name], LCSW

## 2022-08-14 ENCOUNTER — Encounter: Payer: Self-pay | Admitting: *Deleted

## 2022-08-16 ENCOUNTER — Ambulatory Visit: Payer: Self-pay | Admitting: Surgery

## 2022-08-16 DIAGNOSIS — D0512 Intraductal carcinoma in situ of left breast: Secondary | ICD-10-CM

## 2022-08-17 ENCOUNTER — Telehealth: Payer: Self-pay

## 2022-08-17 NOTE — Telephone Encounter (Signed)
[facility]CC[place] Medicaid forms sent to patient to sign and return.

## 2022-08-25 ENCOUNTER — Encounter: Payer: Self-pay | Admitting: *Deleted

## 2022-08-28 ENCOUNTER — Ambulatory Visit: Payer: Self-pay | Admitting: Surgery

## 2022-08-28 DIAGNOSIS — C50912 Malignant neoplasm of unspecified site of left female breast: Secondary | ICD-10-CM

## 2022-08-29 ENCOUNTER — Other Ambulatory Visit: Payer: Self-pay | Admitting: Surgery

## 2022-08-29 ENCOUNTER — Other Ambulatory Visit: Payer: Self-pay | Admitting: *Deleted

## 2022-08-29 DIAGNOSIS — D0512 Intraductal carcinoma in situ of left breast: Secondary | ICD-10-CM

## 2022-08-30 ENCOUNTER — Telehealth: Payer: Self-pay | Admitting: Radiation Oncology

## 2022-08-30 ENCOUNTER — Telehealth: Payer: Self-pay | Admitting: Hematology and Oncology

## 2022-08-30 NOTE — Telephone Encounter (Signed)
Per 2/13 IB Scheduled patient for Post op. Patient aware and confirmed.

## 2022-08-30 NOTE — Telephone Encounter (Signed)
Left message for patient to call back to schedule consult per 2/13 referral.

## 2022-09-11 ENCOUNTER — Encounter: Payer: Self-pay | Admitting: Licensed Clinical Social Worker

## 2022-09-11 ENCOUNTER — Other Ambulatory Visit: Payer: Self-pay

## 2022-09-21 NOTE — Pre-Procedure Instructions (Signed)
Surgical Instructions    Your procedure is scheduled on Wednesday, March 13.  Report to [facility] Main Entrance "A" at 6:30 A.M., then check in with the Admitting office.  Call this number if you have problems the morning of surgery:  [phone]   If you have any questions prior to your surgery date call [phone]: Open Monday-Friday 8am-4pm If you experience any cold or flu symptoms such as cough, fever, chills, shortness of breath, etc. between now and your scheduled surgery, please notify us at the above number     Remember:  Do not eat after midnight the night before your surgery  You may drink clear liquids until 5:30AM the morning of your surgery.   Clear liquids allowed are: Water, Non-Citrus Juices (without pulp), Carbonated Beverages, Clear Tea, Black Coffee ONLY (NO MILK, CREAM OR POWDERED CREAMER of any kind), and Gatorade    Take these medicines the morning of surgery with A SIP OF WATER: NONE   As of today, STOP taking any Aspirin (unless otherwise instructed by your surgeon) Aleve, Naproxen, Ibuprofen, Motrin, Advil, Goody's, BC's, all herbal medications, fish oil, and all vitamins.             [facility] is not responsible for any belongings or valuables.    Do NOT Smoke (Tobacco/Vaping)  24 hours prior to your procedure  If you use a CPAP at night, you may bring your mask for your overnight stay.   Contacts, glasses, hearing aids, dentures or partials may not be worn into surgery, please bring cases for these belongings   For patients admitted to the hospital, discharge time will be determined by your treatment team.   Patients discharged the day of surgery will not be allowed to drive home, and someone needs to stay with them for 24 hours.   SURGICAL WAITING ROOM VISITATION Patients having surgery or a procedure may have no more than 2 support people in the waiting area - these visitors may rotate.   Children under the age of 54 must have an adult with  them who is not the patient. If the patient needs to stay at the hospital during part of their recovery, the visitor guidelines for inpatient rooms apply. Pre-op nurse will coordinate an appropriate time for 1 support person to accompany patient in pre-op.  This support person may not rotate.   Please refer to [email or web address] for the visitor guidelines for Inpatients (after your surgery is over and you are in a regular room).    Special instructions:    Oral Hygiene is also important to reduce your risk of infection.  Remember - BRUSH YOUR TEETH THE MORNING OF SURGERY WITH YOUR REGULAR TOOTHPASTE   [facility]- Preparing For Surgery  Before surgery, you can play an important role. Because skin is not sterile, your skin needs to be as free of germs as possible. You can reduce the number of germs on your skin by washing with CHG (chlorahexidine gluconate) Soap before surgery.  CHG is an antiseptic cleaner which kills germs and bonds with the skin to continue killing germs even after washing.     Please do not use if you have an allergy to CHG or antibacterial soaps. If your skin becomes reddened/irritated stop using the CHG.  Do not shave (including legs and underarms) for at least 48 hours prior to first CHG shower. It is OK to shave your face.  Please follow these instructions carefully.     Shower the [employer]  SURGERY and the MORNING OF SURGERY with CHG Soap.   If you chose to wash your hair, wash your hair first as usual with your normal shampoo. After you shampoo, rinse your hair and body thoroughly to remove the shampoo.  Then [employer] and genitals (private parts) with your normal soap and rinse thoroughly to remove soap.  After that Use CHG Soap as you would any other liquid soap. You can apply CHG directly to the skin and wash gently with a scrungie or a clean washcloth.   Apply the CHG Soap to your body ONLY FROM THE NECK  DOWN.  Do not use on open wounds or open sores. Avoid contact with your eyes, ears, mouth and genitals (private parts). Wash Face and genitals (private parts)  with your normal soap.   Wash thoroughly, paying special attention to the area where your surgery will be performed.  Thoroughly rinse your body with warm water from the neck down.  DO NOT shower/wash with your normal soap after using and rinsing off the CHG Soap.  Pat yourself dry with a CLEAN TOWEL.  Wear CLEAN PAJAMAS to bed the night before surgery  Place CLEAN SHEETS on your bed the night before your surgery  DO NOT SLEEP WITH PETS.   Day of Surgery:  Take a shower with CHG soap. Wear Clean/Comfortable clothing the morning of surgery Do not wear jewelry or makeup. Do not wear lotions, powders, perfumes/cologne or deodorant. Do not shave 48 hours prior to surgery.  Men may shave face and neck. Do not bring valuables to the hospital. Do not wear nail polish, gel polish, artificial nails, or any other type of covering on natural nails (fingers and toes) If you have artificial nails or gel coating that need to be removed by a nail salon, please have this removed prior to surgery. Artificial nails or gel coating may interfere with anesthesia's ability to adequately monitor your vital signs. Remember to brush your teeth WITH YOUR REGULAR TOOTHPASTE.    If you received a COVID test during your pre-op visit, it is requested that you wear a mask when out in public, stay away from anyone that may not be feeling well, and notify your surgeon if you develop symptoms. If you have been in contact with anyone that has tested positive in the last 10 days, please notify your surgeon.    Please read over the following fact sheets that you were given.

## 2022-09-22 ENCOUNTER — Encounter (HOSPITAL_COMMUNITY): Payer: Self-pay

## 2022-09-22 ENCOUNTER — Encounter (HOSPITAL_COMMUNITY)
Admission: RE | Admit: 2022-09-22 | Discharge: 2022-09-22 | Disposition: A | Discharge status: Home / Self Care | Payer: Self-pay | Source: Ambulatory Visit | Attending: Surgery | Admitting: Surgery

## 2022-09-22 ENCOUNTER — Other Ambulatory Visit: Payer: Self-pay

## 2022-09-22 VITALS — Ht 67.0 in | Wt 235.0 lb

## 2022-09-22 DIAGNOSIS — Z01812 Encounter for preprocedural laboratory examination: Secondary | ICD-10-CM | POA: Insufficient documentation

## 2022-09-22 DIAGNOSIS — Z01818 Encounter for other preprocedural examination: Secondary | ICD-10-CM

## 2022-09-22 LAB — CBC
HCT: 41.5 % (ref 36.0–46.0)
Hemoglobin: 13.4 g/dL (ref 12.0–15.0)
MCH: 27.9 pg (ref 26.0–34.0)
MCHC: 32.3 g/dL (ref 30.0–36.0)
MCV: 86.3 fL (ref 80.0–100.0)
Platelets: 352 10*3/uL (ref 150–400)
RBC: 4.81 MIL/uL (ref 3.87–5.11)
RDW: 14.6 % (ref 11.5–15.5)
WBC: 6.7 10*3/uL (ref 4.0–10.5)
nRBC: 0 % (ref 0.0–0.2)

## 2022-09-22 LAB — POCT PREGNANCY, URINE: Preg Test, Ur: NEGATIVE

## 2022-09-22 NOTE — Progress Notes (Addendum)
Patient given instructions over the phone after not showing up for her PAT appointment. All questions answered. Patient will be coming in shortly to give a urine sample for a urine pregnancy test. Chart sent to anesthesia for review due to seed placement.

## 2022-09-25 ENCOUNTER — Other Ambulatory Visit: Payer: Self-pay | Admitting: Surgery

## 2022-09-25 ENCOUNTER — Encounter (HOSPITAL_COMMUNITY): Payer: Self-pay | Admitting: Emergency Medicine

## 2022-09-25 DIAGNOSIS — R9389 Abnormal findings on diagnostic imaging of other specified body structures: Secondary | ICD-10-CM

## 2022-09-26 ENCOUNTER — Encounter: Payer: Self-pay | Admitting: *Deleted

## 2022-09-27 ENCOUNTER — Ambulatory Visit (HOSPITAL_COMMUNITY): Admission: RE | Admit: 2022-09-27 | Payer: Medicaid Other | Source: Ambulatory Visit | Admitting: Surgery

## 2022-09-27 ENCOUNTER — Telehealth: Payer: Self-pay | Admitting: Hematology and Oncology

## 2022-09-27 ENCOUNTER — Telehealth: Payer: Self-pay | Admitting: Radiation Oncology

## 2022-09-27 SURGERY — BREAST LUMPECTOMY WITH RADIOACTIVE SEED LOCALIZATION
Anesthesia: General | Site: Breast | Laterality: Bilateral

## 2022-09-27 NOTE — Telephone Encounter (Signed)
Cancelled 4/10 RadOnc appointments per Dr. Isidore Moos due to 3/13 surgery being cancelled. Dr. Brantley Stage to re-refer patient back after surgery in April. Called patient to update. Patient aware of changes. Closed referral.

## 2022-09-27 NOTE — Telephone Encounter (Signed)
Cancelled appointment per 3/12 scheduling message. Patient is aware of the changes made to her upcoming appointment.

## 2022-10-04 ENCOUNTER — Other Ambulatory Visit: Payer: Self-pay

## 2022-10-04 ENCOUNTER — Inpatient Hospital Stay
Admission: RE | Admit: 2022-10-04 | Discharge: 2022-10-04 | Disposition: A | Discharge status: Home / Self Care | Payer: Self-pay | Source: Ambulatory Visit | Attending: Surgery | Admitting: Surgery

## 2022-10-05 ENCOUNTER — Encounter: Payer: Self-pay | Admitting: *Deleted

## 2022-10-06 ENCOUNTER — Inpatient Hospital Stay: Payer: Self-pay | Admitting: Hematology and Oncology

## 2022-10-12 ENCOUNTER — Encounter: Payer: Self-pay | Admitting: Surgery

## 2022-10-12 ENCOUNTER — Ambulatory Visit
Admission: RE | Admit: 2022-10-12 | Discharge: 2022-10-12 | Disposition: A | Discharge status: Home / Self Care | Payer: Medicaid Other | Source: Ambulatory Visit | Attending: Surgery | Admitting: Surgery

## 2022-10-12 DIAGNOSIS — R9389 Abnormal findings on diagnostic imaging of other specified body structures: Secondary | ICD-10-CM

## 2022-10-12 MED ORDER — GADOPICLENOL 0.5 MMOL/ML IV SOLN
10.0000 mL | Freq: Once | INTRAVENOUS | Status: AC | PRN
  Administered 2022-10-12: 10 mL via INTRAVENOUS

## 2022-10-13 ENCOUNTER — Other Ambulatory Visit: Payer: Self-pay | Admitting: Surgery

## 2022-10-13 ENCOUNTER — Encounter: Payer: Self-pay | Admitting: *Deleted

## 2022-10-13 DIAGNOSIS — D0512 Intraductal carcinoma in situ of left breast: Secondary | ICD-10-CM

## 2022-10-16 ENCOUNTER — Encounter: Payer: Self-pay | Admitting: *Deleted

## 2022-10-18 ENCOUNTER — Ambulatory Visit: Payer: Self-pay | Admitting: Surgery

## 2022-10-18 DIAGNOSIS — D0512 Intraductal carcinoma in situ of left breast: Secondary | ICD-10-CM

## 2022-10-19 ENCOUNTER — Encounter: Payer: Self-pay | Admitting: *Deleted

## 2022-10-19 DIAGNOSIS — D0512 Intraductal carcinoma in situ of left breast: Secondary | ICD-10-CM

## 2022-10-20 ENCOUNTER — Telehealth: Payer: Self-pay | Admitting: Hematology and Oncology

## 2022-10-20 ENCOUNTER — Other Ambulatory Visit: Payer: Self-pay | Admitting: Surgery

## 2022-10-20 ENCOUNTER — Telehealth: Payer: Self-pay | Admitting: Radiation Oncology

## 2022-10-20 ENCOUNTER — Telehealth: Payer: Self-pay

## 2022-10-20 DIAGNOSIS — D0512 Intraductal carcinoma in situ of left breast: Secondary | ICD-10-CM

## 2022-10-20 NOTE — Telephone Encounter (Signed)
Attempted to call patient to schedule post op per 4/5 IB message. Voicemail not set up. Mailing reminder.

## 2022-10-20 NOTE — Telephone Encounter (Signed)
[facility] Medicaid approved 06/16/2022-06/16/2023, MID: [id number] M. Patient informed.

## 2022-10-20 NOTE — Telephone Encounter (Signed)
Called patient to schedule a FUN appt w. Dr. Basilio Cairo. No answer, LVM for a return call.

## 2022-10-25 ENCOUNTER — Ambulatory Visit: Payer: Self-pay | Admitting: Radiation Oncology

## 2022-10-25 ENCOUNTER — Ambulatory Visit: Payer: Self-pay

## 2022-11-03 ENCOUNTER — Other Ambulatory Visit: Payer: Self-pay

## 2022-11-03 ENCOUNTER — Encounter (HOSPITAL_BASED_OUTPATIENT_CLINIC_OR_DEPARTMENT_OTHER): Payer: Self-pay | Admitting: Surgery

## 2022-11-07 ENCOUNTER — Ambulatory Visit
Admission: RE | Admit: 2022-11-07 | Discharge: 2022-11-07 | Disposition: A | Discharge status: Home / Self Care | Payer: Medicaid Other | Source: Ambulatory Visit | Attending: Surgery | Admitting: Surgery

## 2022-11-07 ENCOUNTER — Other Ambulatory Visit: Payer: Self-pay | Admitting: Surgery

## 2022-11-07 DIAGNOSIS — D0512 Intraductal carcinoma in situ of left breast: Secondary | ICD-10-CM

## 2022-11-07 HISTORY — PX: BREAST BIOPSY: SHX20

## 2022-11-08 NOTE — Progress Notes (Signed)
       Patient Instructions  The night before surgery:  No food after midnight. ONLY clear liquids after midnight  The day of surgery (if you do NOT have diabetes):  Drink ONE (1) Pre-Surgery Clear Ensure as directed.   This drink was given to you during your hospital  pre-op appointment visit. The pre-op nurse will instruct you on the time to drink the  Pre-Surgery Ensure depending on your surgery time. Finish the drink at the designated time by the pre-op nurse.  Nothing else to drink after completing the  Pre-Surgery Clear Ensure.           If you have questions, please contact your surgeon's office.Gave patient CHG soap with instructions, patient verbalized understanding.

## 2022-11-09 ENCOUNTER — Ambulatory Visit (HOSPITAL_BASED_OUTPATIENT_CLINIC_OR_DEPARTMENT_OTHER): Payer: Medicaid Other | Admitting: Certified Registered"

## 2022-11-09 ENCOUNTER — Other Ambulatory Visit: Payer: Self-pay

## 2022-11-09 ENCOUNTER — Ambulatory Visit
Admission: RE | Admit: 2022-11-09 | Discharge: 2022-11-09 | Disposition: A | Discharge status: Home / Self Care | Payer: Medicaid Other | Source: Ambulatory Visit | Attending: Surgery | Admitting: Surgery

## 2022-11-09 ENCOUNTER — Encounter (HOSPITAL_BASED_OUTPATIENT_CLINIC_OR_DEPARTMENT_OTHER)
Admission: RE | Disposition: A | Discharge status: Home / Self Care | Payer: Self-pay | Source: Home / Self Care | Attending: Surgery

## 2022-11-09 ENCOUNTER — Ambulatory Visit (HOSPITAL_COMMUNITY)
Admission: RE | Admit: 2022-11-09 | Discharge: 2022-11-09 | Disposition: A | Discharge status: Home / Self Care | Payer: Medicaid Other | Attending: Surgery | Admitting: Surgery

## 2022-11-09 DIAGNOSIS — Z170 Estrogen receptor positive status [ER+]: Secondary | ICD-10-CM | POA: Diagnosis not present

## 2022-11-09 DIAGNOSIS — Z01818 Encounter for other preprocedural examination: Secondary | ICD-10-CM

## 2022-11-09 DIAGNOSIS — D0512 Intraductal carcinoma in situ of left breast: Secondary | ICD-10-CM

## 2022-11-09 DIAGNOSIS — N61 Mastitis without abscess: Secondary | ICD-10-CM | POA: Insufficient documentation

## 2022-11-09 DIAGNOSIS — J45909 Unspecified asthma, uncomplicated: Secondary | ICD-10-CM

## 2022-11-09 DIAGNOSIS — N6031 Fibrosclerosis of right breast: Secondary | ICD-10-CM | POA: Insufficient documentation

## 2022-11-09 DIAGNOSIS — N6489 Other specified disorders of breast: Secondary | ICD-10-CM | POA: Diagnosis not present

## 2022-11-09 DIAGNOSIS — N6042 Mammary duct ectasia of left breast: Secondary | ICD-10-CM | POA: Diagnosis not present

## 2022-11-09 DIAGNOSIS — N6022 Fibroadenosis of left breast: Secondary | ICD-10-CM | POA: Diagnosis not present

## 2022-11-09 DIAGNOSIS — N6001 Solitary cyst of right breast: Secondary | ICD-10-CM | POA: Diagnosis not present

## 2022-11-09 DIAGNOSIS — F419 Anxiety disorder, unspecified: Secondary | ICD-10-CM | POA: Diagnosis not present

## 2022-11-09 DIAGNOSIS — N6021 Fibroadenosis of right breast: Secondary | ICD-10-CM | POA: Insufficient documentation

## 2022-11-09 DIAGNOSIS — N6082 Other benign mammary dysplasias of left breast: Secondary | ICD-10-CM | POA: Insufficient documentation

## 2022-11-09 HISTORY — PX: BREAST LUMPECTOMY WITH RADIOACTIVE SEED LOCALIZATION: SHX6424

## 2022-11-09 HISTORY — DX: Unspecified asthma, uncomplicated: J45.909

## 2022-11-09 LAB — POCT PREGNANCY, URINE: Preg Test, Ur: NEGATIVE

## 2022-11-09 SURGERY — BREAST LUMPECTOMY WITH RADIOACTIVE SEED LOCALIZATION
Anesthesia: General | Site: Breast | Laterality: Bilateral

## 2022-11-09 MED ORDER — CHLORHEXIDINE GLUCONATE CLOTH 2 % EX PADS: 6.0000 | MEDICATED_PAD | Freq: Once | CUTANEOUS | Status: DC

## 2022-11-09 MED ORDER — LACTATED RINGERS IV SOLN: INTRAVENOUS | Status: DC

## 2022-11-09 MED ORDER — ONDANSETRON HCL 4 MG/2ML IJ SOLN
INTRAMUSCULAR | Status: AC
  Filled 2022-11-09: qty 2

## 2022-11-09 MED ORDER — CEFAZOLIN SODIUM-DEXTROSE 2-4 GM/100ML-% IV SOLN
2.0000 g | INTRAVENOUS | Status: AC
  Administered 2022-11-09: 2 g via INTRAVENOUS

## 2022-11-09 MED ORDER — PROMETHAZINE HCL 25 MG/ML IJ SOLN: 6.2500 mg | INTRAMUSCULAR | Status: DC | PRN

## 2022-11-09 MED ORDER — PROPOFOL 10 MG/ML IV BOLUS
INTRAVENOUS | Status: DC | PRN
  Administered 2022-11-09: 250 mg via INTRAVENOUS

## 2022-11-09 MED ORDER — OXYCODONE HCL 5 MG PO TABS
5.0000 mg | ORAL_TABLET | Freq: Once | ORAL | Status: AC | PRN
  Administered 2022-11-09: 5 mg via ORAL

## 2022-11-09 MED ORDER — DEXAMETHASONE SODIUM PHOSPHATE 10 MG/ML IJ SOLN
INTRAMUSCULAR | Status: AC
  Filled 2022-11-09: qty 1

## 2022-11-09 MED ORDER — ONDANSETRON HCL 4 MG/2ML IJ SOLN
INTRAMUSCULAR | Status: DC | PRN
  Administered 2022-11-09: 4 mg via INTRAVENOUS

## 2022-11-09 MED ORDER — DEXAMETHASONE SODIUM PHOSPHATE 10 MG/ML IJ SOLN
INTRAMUSCULAR | Status: DC | PRN
  Administered 2022-11-09: 10 mg via INTRAVENOUS

## 2022-11-09 MED ORDER — IBUPROFEN 800 MG PO TABS
800.0000 mg | ORAL_TABLET | Freq: Three times a day (TID) | ORAL | 0 refills | Status: DC | PRN
Start: 2022-11-09 — End: 2023-08-05

## 2022-11-09 MED ORDER — HYDROMORPHONE HCL 1 MG/ML IJ SOLN
0.2500 mg | INTRAMUSCULAR | Status: DC | PRN
  Administered 2022-11-09: 0.5 mg via INTRAVENOUS

## 2022-11-09 MED ORDER — MEPERIDINE HCL 25 MG/ML IJ SOLN: 6.2500 mg | INTRAMUSCULAR | Status: DC | PRN

## 2022-11-09 MED ORDER — OXYCODONE HCL 5 MG/5ML PO SOLN: 5.0000 mg | Freq: Once | ORAL | Status: AC | PRN

## 2022-11-09 MED ORDER — PROPOFOL 10 MG/ML IV BOLUS
INTRAVENOUS | Status: AC
  Filled 2022-11-09: qty 20

## 2022-11-09 MED ORDER — CEFAZOLIN SODIUM-DEXTROSE 2-4 GM/100ML-% IV SOLN: 2.0000 g | INTRAVENOUS | Status: DC

## 2022-11-09 MED ORDER — GABAPENTIN 300 MG PO CAPS
ORAL_CAPSULE | ORAL | Status: AC
  Filled 2022-11-09: qty 1

## 2022-11-09 MED ORDER — ACETAMINOPHEN 500 MG PO TABS
ORAL_TABLET | ORAL | Status: AC
  Filled 2022-11-09: qty 2

## 2022-11-09 MED ORDER — FENTANYL CITRATE (PF) 100 MCG/2ML IJ SOLN
INTRAMUSCULAR | Status: AC
  Filled 2022-11-09: qty 2

## 2022-11-09 MED ORDER — AMISULPRIDE (ANTIEMETIC) 5 MG/2ML IV SOLN: 10.0000 mg | Freq: Once | INTRAVENOUS | Status: DC | PRN

## 2022-11-09 MED ORDER — BUPIVACAINE HCL (PF) 0.25 % IJ SOLN
INTRAMUSCULAR | Status: DC | PRN
  Administered 2022-11-09: 30 mL

## 2022-11-09 MED ORDER — LIDOCAINE HCL (CARDIAC) PF 100 MG/5ML IV SOSY
PREFILLED_SYRINGE | INTRAVENOUS | Status: DC | PRN
  Administered 2022-11-09: 40 mg via INTRATRACHEAL

## 2022-11-09 MED ORDER — MIDAZOLAM HCL 2 MG/2ML IJ SOLN
INTRAMUSCULAR | Status: AC
  Filled 2022-11-09: qty 2

## 2022-11-09 MED ORDER — GABAPENTIN 300 MG PO CAPS
300.0000 mg | ORAL_CAPSULE | ORAL | Status: AC
  Administered 2022-11-09: 300 mg via ORAL

## 2022-11-09 MED ORDER — OXYCODONE HCL 5 MG PO TABS
5.0000 mg | ORAL_TABLET | Freq: Four times a day (QID) | ORAL | 0 refills | Status: DC | PRN
Start: 2022-11-09 — End: 2023-08-05

## 2022-11-09 MED ORDER — MIDAZOLAM HCL 5 MG/5ML IJ SOLN
INTRAMUSCULAR | Status: DC | PRN
  Administered 2022-11-09: 2 mg via INTRAVENOUS

## 2022-11-09 MED ORDER — FENTANYL CITRATE (PF) 100 MCG/2ML IJ SOLN
INTRAMUSCULAR | Status: DC | PRN
  Administered 2022-11-09: 25 ug via INTRAVENOUS
  Administered 2022-11-09: 50 ug via INTRAVENOUS
  Administered 2022-11-09: 25 ug via INTRAVENOUS

## 2022-11-09 MED ORDER — CEFAZOLIN SODIUM-DEXTROSE 2-4 GM/100ML-% IV SOLN
INTRAVENOUS | Status: AC
  Filled 2022-11-09: qty 100

## 2022-11-09 MED ORDER — HYDROMORPHONE HCL 1 MG/ML IJ SOLN
INTRAMUSCULAR | Status: AC
  Filled 2022-11-09: qty 0.5

## 2022-11-09 MED ORDER — ACETAMINOPHEN 500 MG PO TABS
1000.0000 mg | ORAL_TABLET | ORAL | Status: AC
  Administered 2022-11-09: 1000 mg via ORAL

## 2022-11-09 MED ORDER — LIDOCAINE 2% (20 MG/ML) 5 ML SYRINGE
INTRAMUSCULAR | Status: AC
  Filled 2022-11-09: qty 5

## 2022-11-09 MED ORDER — SODIUM CHLORIDE 0.9 % IV SOLN
INTRAVENOUS | Status: DC | PRN
  Administered 2022-11-09: 500 mL

## 2022-11-09 MED ORDER — OXYCODONE HCL 5 MG PO TABS
ORAL_TABLET | ORAL | Status: AC
  Filled 2022-11-09: qty 1

## 2022-11-09 SURGICAL SUPPLY — 49 items
ADH SKN CLS APL DERMABOND .7 (GAUZE/BANDAGES/DRESSINGS) ×1
APL PRP STRL LF DISP 70% ISPRP (MISCELLANEOUS) ×2
APPLIER CLIP 9.375 MED OPEN (MISCELLANEOUS) ×1
APR CLP MED 9.3 20 MLT OPN (MISCELLANEOUS) ×1
BINDER BREAST LRG (GAUZE/BANDAGES/DRESSINGS) IMPLANT
BINDER BREAST MEDIUM (GAUZE/BANDAGES/DRESSINGS) IMPLANT
BINDER BREAST XLRG (GAUZE/BANDAGES/DRESSINGS) IMPLANT
BINDER BREAST XXLRG (GAUZE/BANDAGES/DRESSINGS) IMPLANT
BLADE SURG 15 STRL LF DISP TIS (BLADE) ×1 IMPLANT
BLADE SURG 15 STRL SS (BLADE) ×2
CANISTER SUC SOCK COL 7IN (MISCELLANEOUS) IMPLANT
CANISTER SUCT 1200ML W/VALVE (MISCELLANEOUS) IMPLANT
CHLORAPREP W/TINT 26 (MISCELLANEOUS) ×1 IMPLANT
CLIP APPLIE 9.375 MED OPEN (MISCELLANEOUS) IMPLANT
COVER BACK TABLE 60X90IN (DRAPES) ×1 IMPLANT
COVER MAYO STAND STRL (DRAPES) ×1 IMPLANT
COVER PROBE CYLINDRICAL 5X96 (MISCELLANEOUS) ×1 IMPLANT
DERMABOND ADVANCED .7 DNX12 (GAUZE/BANDAGES/DRESSINGS) ×1 IMPLANT
DRAPE LAPAROSCOPIC ABDOMINAL (DRAPES) IMPLANT
DRAPE LAPAROTOMY 100X72 PEDS (DRAPES) ×1 IMPLANT
DRAPE UTILITY XL STRL (DRAPES) ×1 IMPLANT
ELECT COATED BLADE 2.86 ST (ELECTRODE) ×1 IMPLANT
ELECT REM PT RETURN 9FT ADLT (ELECTROSURGICAL) ×1
ELECTRODE REM PT RTRN 9FT ADLT (ELECTROSURGICAL) ×1 IMPLANT
GLOVE BIOGEL PI IND STRL 8 (GLOVE) ×1 IMPLANT
GLOVE ECLIPSE 8.0 STRL XLNG CF (GLOVE) ×1 IMPLANT
GOWN STRL REUS W/ TWL LRG LVL3 (GOWN DISPOSABLE) ×2 IMPLANT
GOWN STRL REUS W/ TWL XL LVL3 (GOWN DISPOSABLE) ×1 IMPLANT
GOWN STRL REUS W/TWL LRG LVL3 (GOWN DISPOSABLE) ×2
GOWN STRL REUS W/TWL XL LVL3 (GOWN DISPOSABLE) ×1
HEMOSTAT ARISTA ABSORB 3G PWDR (HEMOSTASIS) IMPLANT
HEMOSTAT SNOW SURGICEL 2X4 (HEMOSTASIS) IMPLANT
KIT MARKER MARGIN INK (KITS) ×1 IMPLANT
NDL HYPO 25X1 1.5 SAFETY (NEEDLE) ×1 IMPLANT
NEEDLE HYPO 25X1 1.5 SAFETY (NEEDLE) ×2 IMPLANT
NS IRRIG 1000ML POUR BTL (IV SOLUTION) ×1 IMPLANT
PACK BASIN DAY SURGERY FS (CUSTOM PROCEDURE TRAY) ×1 IMPLANT
PENCIL SMOKE EVACUATOR (MISCELLANEOUS) ×1 IMPLANT
SLEEVE SCD COMPRESS KNEE MED (STOCKING) ×1 IMPLANT
SPIKE FLUID TRANSFER (MISCELLANEOUS) IMPLANT
SPONGE T-LAP 4X18 ~~LOC~~+RFID (SPONGE) ×1 IMPLANT
SUT MNCRL AB 4-0 PS2 18 (SUTURE) ×1 IMPLANT
SUT SILK 2 0 SH (SUTURE) IMPLANT
SUT VICRYL 3-0 CR8 SH (SUTURE) ×1 IMPLANT
SYR CONTROL 10ML LL (SYRINGE) ×1 IMPLANT
TOWEL GREEN STERILE FF (TOWEL DISPOSABLE) ×1 IMPLANT
TRAY FAXITRON CT DISP (TRAY / TRAY PROCEDURE) ×1 IMPLANT
TUBE CONNECTING 20X1/4 (TUBING) IMPLANT
YANKAUER SUCT BULB TIP NO VENT (SUCTIONS) IMPLANT

## 2022-11-09 NOTE — Op Note (Signed)
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  She has undergone extensive workup and evaluation.  After reviewing her options she opted for left breast lumpectomy.  Of note she also had a radial scar on the right.  She has had numerous other MRI biopsies of the left which show fibrocystic change.  Risks and benefits as well as other options of treatment discussed.  Breast conserving surgery versus mastectomy reconstruction reviewed.  She opted for bilateral breast seed localized lumpectomy.The procedure has been discussed with the patient.  Alternative therapies have been discussed with the patient.  Operative risks include bleeding,  Infection,  Organ injury,  Nerve injury,  Blood vessel injury,  DVT,  Pulmonary embolism,  Death,  And possible reoperation.  Medical management risks include worsening of present situation.  The success of the procedure is 50 -90 % at treating patients symptoms.  The patient understands and agrees to proceed.   Description of procedure: The patient was met in the holding area and questions were answered.  She underwent seed placement in both breasts yesterday.  All questions were answered.  She was then taken back to the operating room.  She was placed supine upon the operating room table.  After induction of general anesthesia, both breasts were prepped and draped in sterile fashion and timeout performed.   Proper patient, site and procedure verified and films were available for review.  The right side was done first.  Prior to this, I injected 2 cc of mag trace in the left breast in the subareolar complex for potential lymph node mapping if necessary.  A curvilinear incision was made along the inferior border of the nipple areolar complex.  Dissection was carried down all tissue around the seed and clip were excised with a grossly negative margin.  The Faxitron image revealed the seed and clip to be present.  The cavity was irrigated.  Local anesthetic was infiltrated.  Deep tissue layers were approximated 3-0 Vicryl.  4 Monocryl was used to close the skin in a subcuticular fashion.  Left breast was then evaluated with the neoprobe.  The seed was identified left breast lower outer quadrant along the border of the nipple areolar complex.  A curvilinear incision was made there as well.  Dissection was carried down all tissue around the seed and clip were excised with a grossly negative margin.  The resection was taken to the undersurface of the nipple.  The all specimens were oriented with ink and sent to pathology.  This wound was closed with a deep layer 3-0 Vicryl.  4 Monocryl was used to close the skin in a subcuticular fashion.  Dermabond was applied.  All counts were found to be correct.  Breast binder placed.  The patient was then awoke extubated taken to recovery in satisfactory condition.

## 2022-11-09 NOTE — Interval H&P Note (Signed)
History and Physical Interval Note:  11/09/2022 1:05 PM  Erin Jacobs  has presented today for surgery, with the diagnosis of LEFT BREAST DCIS AND RIGHT BREAST RADIAL SCAR.  The various methods of treatment have been discussed with the patient and family. After consideration of risks, benefits and other options for treatment, the patient has consented to  Procedure(s): BILATERAL BREAST LUMPECTOMY WITH RADIOACTIVE SEED LOCALIZATION (Bilateral) as a surgical intervention.  The patient's history has been reviewed, patient examined, no change in status, stable for surgery.  I have reviewed the patient's chart and labs.  Questions were answered to the patient's satisfaction.     Janeth Terry A Parisha Beaulac

## 2022-11-09 NOTE — Anesthesia Preprocedure Evaluation (Addendum)
Anesthesia Evaluation  Patient identified by MRN, date of birth, ID band Patient awake    Reviewed: Allergy & Precautions, H&P , NPO status , Patient's Chart, lab work & pertinent test results  Airway Mallampati: II  TM Distance: >3 FB Neck ROM: Full    Dental no notable dental hx.    Pulmonary asthma    Pulmonary exam normal breath sounds clear to auscultation       Cardiovascular negative cardio ROS Normal cardiovascular exam Rhythm:Regular Rate:Normal     Neuro/Psych   Anxiety     negative neurological ROS  negative psych ROS   GI/Hepatic negative GI ROS, Neg liver ROS,,,  Endo/Other  negative endocrine ROS    Renal/GU negative Renal ROS  negative genitourinary   Musculoskeletal negative musculoskeletal ROS (+)    Abdominal  (+) + obese  Peds negative pediatric ROS (+)  Hematology negative hematology ROS (+)   Anesthesia Other Findings   Reproductive/Obstetrics negative OB ROS                             Anesthesia Physical Anesthesia Plan  ASA: 2  Anesthesia Plan: General   Post-op Pain Management: Minimal or no pain anticipated   Induction: Intravenous  PONV Risk Score and Plan: 3 and Ondansetron, Dexamethasone, Midazolam and Treatment may vary due to age or medical condition  Airway Management Planned: LMA  Additional Equipment:   Intra-op Plan:   Post-operative Plan: Extubation in OR  Informed Consent: I have reviewed the patients History and Physical, chart, labs and discussed the procedure including the risks, benefits and alternatives for the proposed anesthesia with the patient or authorized representative who has indicated his/her understanding and acceptance.     Dental advisory given  Plan Discussed with: CRNA  Anesthesia Plan Comments:        Anesthesia Quick Evaluation

## 2022-11-09 NOTE — Discharge Instructions (Addendum)
[facility] Office Phone Number [phone]  BREAST BIOPSY/ PARTIAL MASTECTOMY: POST OP INSTRUCTIONS  Always review your discharge instruction sheet given to you by the facility where your surgery was performed.  IF YOU HAVE DISABILITY OR FAMILY LEAVE FORMS, YOU MUST BRING THEM TO THE OFFICE FOR PROCESSING.  DO NOT GIVE THEM TO YOUR DOCTOR.  A prescription for pain medication may be given to you upon discharge.  Take your pain medication as prescribed, if needed.  If narcotic pain medicine is not needed, then you may take acetaminophen (Tylenol) or ibuprofen (Advil) as needed. Take your usually prescribed medications unless otherwise directed If you need a refill on your pain medication, please contact your pharmacy.  They will contact our office to request authorization.  Prescriptions will not be filled after 5pm or on week-ends. You should eat very light the first 24 hours after surgery, such as soup, crackers, pudding, etc.  Resume your normal diet the day after surgery. Most patients will experience some swelling and bruising in the breast.  Ice packs and a good support bra will help.  Swelling and bruising can take several days to resolve.  It is common to experience some constipation if taking pain medication after surgery.  Increasing fluid intake and taking a stool softener will usually help or prevent this problem from occurring.  A mild laxative (Milk of Magnesia or Miralax) should be taken according to package directions if there are no bowel movements after 48 hours. Unless discharge instructions indicate otherwise, you may remove your bandages 24-48 hours after surgery, and you may shower at that time.  You may have steri-strips (small skin tapes) in place directly over the incision.  These strips should be left on the skin for 7-10 days.  If your surgeon used skin glue on the incision, you may shower in 24 hours.  The glue will flake off over the next 2-3 weeks.  Any  sutures or staples will be removed at the office during your follow-up visit. ACTIVITIES:  You may resume regular daily activities (gradually increasing) beginning the next day.  Wearing a good support bra or sports bra minimizes pain and swelling.  You may have sexual intercourse when it is comfortable. You may drive when you no longer are taking prescription pain medication, you can comfortably wear a seatbelt, and you can safely maneuver your car and apply brakes. RETURN TO WORK:  ______________________________________________________________________________________ You should see your doctor in the office for a follow-up appointment approximately two weeks after your surgery.  Your doctor's nurse will typically make your follow-up appointment when she calls you with your pathology report.  Expect your pathology report 2-3 business days after your surgery.  You may call to check if you do not hear from us after three days. OTHER INSTRUCTIONS: _______________________________________________________________________________________________ _____________________________________________________________________________________________________________________________________ _____________________________________________________________________________________________________________________________________ _____________________________________________________________________________________________________________________________________  WHEN TO CALL YOUR DOCTOR: Fever over 101.0 Nausea and/or vomiting. Extreme swelling or bruising. Continued bleeding from incision. Increased pain, redness, or drainage from the incision.  The clinic staff is available to answer your questions during regular business hours.  Please don't hesitate to call and ask to speak to one of the nurses for clinical concerns.  If you have a medical emergency, go to the nearest emergency room or call 911.  A surgeon from [facility] is always on call at the hospital.  For further questions, please visit [email or web address]    Post Anesthesia Home Care Instructions  Activity: Get plenty of rest for the remainder of   the day. A responsible individual must stay with you for 24 hours following the procedure.  For the next 24 hours, DO NOT: -Drive a car -Operate machinery -Drink alcoholic beverages -Take any medication unless instructed by your physician -Make any legal decisions or sign important papers.  Meals: Start with liquid foods such as gelatin or soup. Progress to regular foods as tolerated. Avoid greasy, spicy, heavy foods. If nausea and/or vomiting occur, drink only clear liquids until the nausea and/or vomiting subsides. Call your physician if vomiting continues.  Special Instructions/Symptoms: Your throat may feel dry or sore from the anesthesia or the breathing tube placed in your throat during surgery. If this causes discomfort, gargle with warm salt water. The discomfort should disappear within 24 hours.  If you had a scopolamine patch placed behind your ear for the management of post- operative nausea and/or vomiting:  1. The medication in the patch is effective for 72 hours, after which it should be removed.  Wrap patch in a tissue and discard in the trash. Wash hands thoroughly with soap and water. 2. You may remove the patch earlier than 72 hours if you experience unpleasant side effects which may include dry mouth, dizziness or visual disturbances. 3. Avoid touching the patch. Wash your hands with soap and water after contact with the patch.      

## 2022-11-09 NOTE — Transfer of Care (Signed)
Immediate Anesthesia Transfer of Care Note  Patient: Erin Jacobs  Procedure(s) Performed: BILATERAL BREAST LUMPECTOMY WITH RADIOACTIVE SEED LOCALIZATION; MAGTRACE INJECTION LEFT BREAST (Bilateral: Breast)  Patient Location: PACU  Anesthesia Type:General  Level of Consciousness: drowsy and patient cooperative  Airway & Oxygen Therapy: Patient Spontanous Breathing and Patient connected to face mask oxygen  Post-op Assessment: Report given to RN and Post -op Vital signs reviewed and stable  Post vital signs: Reviewed and stable  Last Vitals:  Vitals Value Taken Time  BP    Temp    Pulse    Resp    SpO2      Last Pain:  Vitals:   11/09/22 1205  TempSrc: Oral  PainSc: 6       Patients Stated Pain Goal: 3 (11/09/22 1205)  Complications: No notable events documented.

## 2022-11-09 NOTE — H&P (Signed)
History of Present Illness: Erin Jacobs is a 27 y.o. female who is seen today as an office consultation for evaluation of New Consultation (Left Breast ) .  Patient sent for evaluation of newly diagnosed left breast DCIS. She noted prior breast pain about 2 months ago. Mammograms were obtained and multiple lesions were found in both breast. Her left breast biopsy 2 weeks ago had a focus of DCIS was encountered. MRI was done which showed multiple abnormalities this area being his largest 6 cm. She has multiple right-sided lesions as well and these were biopsied a few days ago results pending. Family history of uterine cancer in her mother. Other cancers were noted by her mother who is with her today but could not remember subtype. Complains of right breast pain from recent biopsies. Patient has noticed no masses in either breast.  Review of Systems: A complete review of systems was obtained from the patient. I have reviewed this information and discussed as appropriate with the patient. See HPI as well for other ROS.    Medical History: History reviewed. No pertinent past medical history.  There is no problem list on file for this patient.  History reviewed. No pertinent surgical history.  No Known Allergies  No current outpatient medications on file prior to visit.  No current facility-administered medications on file prior to visit.  History reviewed. No pertinent family history.  Social History  Tobacco Use Smoking Status Never Smokeless Tobacco Never   Social History  Socioeconomic History Marital status: Single Tobacco Use Smoking status: Never Smokeless tobacco: Never Substance and Sexual Activity Alcohol use: Yes Drug use: Never  Objective:  Vitals: 07/18/22 1409 BP: 124/82 Pulse: 86 Temp: 36.2 C (97.1 F) SpO2: 96% Weight: (!) 103 kg (227 lb) Height: 170.2 cm ( ) PainSc: 8 PainLoc: Breast  Body mass index is 35.55 kg/m.  Physical Exam Exam  conducted with a chaperone present. HENT: Head: Normocephalic. Eyes: Pupils: Pupils are equal, round, and reactive to light. Cardiovascular: Rate and Rhythm: Normal rate. Pulmonary: Effort: Pulmonary effort is normal. Breath sounds: No stridor. Chest: Breasts: Right: Tenderness present. No inverted nipple, mass or nipple discharge. Left: Tenderness present. No inverted nipple, mass or nipple discharge. Musculoskeletal: General: Normal range of motion. Cervical back: Normal range of motion. Lymphadenopathy: Upper Body: Right upper body: No supraclavicular or axillary adenopathy. Left upper body: No supraclavicular or axillary adenopathy. Skin: General: Skin is warm. Neurological: General: No focal deficit present. Mental Status: She is alert. Psychiatric: Mood and Affect: Mood normal.    Labs, Imaging and Diagnostic Testing:  CLINICAL DATA: Recent ultrasound-guided biopsy for a LEFT breast mass revealing DCIS.  EXAM: BILATERAL BREAST MRI WITH AND WITHOUT CONTRAST  TECHNIQUE: Multiplanar, multisequence MR images of both breasts were obtained prior to and following the intravenous administration of 10 ml of Vueway  Three-dimensional MR images were rendered by post-processing of the original MR data on an independent workstation. The three-dimensional MR images were interpreted, and findings are reported in the following complete MRI report for this study. Three dimensional images were evaluated at the independent interpreting workstation using the DynaCAD thin client.  COMPARISON: No previous breast MRI. Comparison is made to bilateral diagnostic mammogram and ultrasound dated 07/06/2022 and ultrasound-guided biopsy dated 07/04/2022.  FINDINGS: Breast composition: b. Scattered fibroglandular tissue.  Background parenchymal enhancement: Moderate to marked  Right breast: Irregular enhancing mass within the lower RIGHT breast, 6-7 o'clock axis region, measuring  9 mm greatest dimension (series 6, image 119).  Focal  non-mass enhancement within the lower outer quadrant to 9 o'clock axis of the RIGHT breast, at anterior depth measuring approximately 1.3 cm extent (series 6, image 114).  Left breast: Biopsy site marker within the outer periareolar LEFT breast (3 o'clock axis, 1 cm from the nipple, per earlier ultrasound localization), with surrounding non mass enhancement measuring 8 mm, corresponding to the biopsy-proven DCIS (series 6, image 81).  Additional segmental non-mass enhancement extends from the adjacent subareolar LEFT breast to middle depth, lower outer quadrant, measuring 6.2 cm extent, suspicious for additional extent of disease (series 6, images 81 through 86).  Lymph nodes: Single enlarged/morphologically abnormal lymph node in the RIGHT axilla (series 6, image 40).  Two enlarged/morphologically abnormal lymph nodes are seen within the LEFT axilla, 1 of which contains a biopsy clip from recent axillary lymph node biopsy which was negative for carcinoma.  Ancillary findings: None.  IMPRESSION: 1. 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 2. Additional segmental non-mass enhancement extending from the adjacent subareolar LEFT breast to middle depth, lower outer quadrant, measuring 6.2 cm extent, suspicious for additional extent of disease. MRI-guided biopsies are recommended, with targeting of this enhancement at its middle/central and posterior aspect (series 6, images 84 to 86). 3. Focal non-mass enhancement within the lower outer quadrant to 9 o'clock axis of the RIGHT breast, at anterior depth, measuring approximately 1.3 cm extent. A likely sonographic correlate was identified on RIGHT breast ultrasound of 07/06/2022, scheduled for ultrasound-guided biopsy on 07/14/2022. 4.  Additional irregular enhancing mass within the lower RIGHT breast, 6-7 o'clock axis region, measuring 9 mm. A likely sonographic correlate was identified on RIGHT breast ultrasound of 07/06/2022, scheduled for ultrasound-guided biopsy on 07/14/2022. 5. Single enlarged/morphologically abnormal lymph node in the RIGHT axilla. This is also scheduled for ultrasound-guided biopsy on 07/14/2022.  RECOMMENDATION: 1. Patient is scheduled on December 29th for ultrasound-guided biopsies of RIGHT breast masses at the 9 o'clock axis and 7 o'clock axis, and also for a morphologically abnormal lymph node in the RIGHT axilla. These breast masses are likely sonographic correlates for the focal non-mass enhancement and the irregular enhancing mass seen on today's MRI within the RIGHT breast. If ultrasound-guided biopsy results are benign, recommend a follow-up breast MRI in 6 months per protocol. 2. If breast conservation surgery is considered for the LEFT breast, recommend MRI-guided biopsies for the suspicious non-mass enhancement adjacent to and extending posterior to patient's biopsy-proven DCIS within the LEFT breast, measuring 6.2 cm extent, with 2-site biopsy targeting of the enhancement at its middle/central and posterior aspects (series 6, images 84-86).  BI-RADS CATEGORY 4: Suspicious.   Electronically Signed By: Bary Richard M.D. On: 07/11/2022 08:09  Diagnosis 1. Breast, left, needle core biopsy, 3:00 1 cmfn, coil clip - DUCTAL CARCINOMA IN SITU, INTERMEDIATE GRADE, WITH FOCAL AREAS SHOWING HIGH-GRADE MORPHOLOGY - NECROSIS: NOT IDENTIFIED - CALCIFICATIONS: NOT IDENTIFIED - DCIS LENGTH: 0.6 CM NOTE: DR. Kenyon Ana REVIEWED THE CASE AND CONCURS WITH THE INTERPRETATION. A BREAST PROGNOSTIC PROFILE (ER AND PR) IS PENDING AND WILL BE REPORTED IN AN ADDENDUM. [facility]AS NOTIFIED ON 07/05/2022. 2. Breast, axillary tail, hydromark clip - NEGATIVE FOR CARCINOMA -  LYMPHOID TISSUE PRESENT Nupur [place] Callas M.D. Pathologist, Electronic Signature (Case signed 07/05/2022) Specimen Gross and Clinical Information Specimen Comment 1. TIF: 12:15 pm, CIT: < 30 sec, left breast mass and thickened left axillary node 2. TIF:  12:25 pm, CIT: < 30 sec 1 of 3 FINAL for MYRTA, MERCER ([id number]) Specimen(s) Obtained: 1. Breast, left, needle core biopsy, 3:00 1 cmfn, coil clip 2. Breast, axillary tail, hydromark clip Specimen Clinical Information 1. Mass: c/f IDC vs FA vs papilloma node: reactive vs mets 2. Left axillary node Gross 1. Received in formalin labeled with the patient's name Paticia Stack Baratta) and "LtBR 3 o'clk 1 cmfn" are two pieces of yellow-tan fibrofatty tissue ranging from 0.4 x 0.2 x 0.2 cm to 0.6 x 0.2 x 0.2 cm, submitted in toto in a single cassette. TIF 12:15, CIT <30 seconds (coil clip). (LEF, 07/04/2022) 2. Received in formalin labeled with the patient's name Paticia Stack Blau) and "LtBR axilla" are two pieces of yellow-tan fibrofatty tissue, each 0.3 x 0.2 x 0.2 cm, submitted in toto in a single cassette. TIF 12:25, CIT <30 seconds (hydromark clip). (LEF, 07/04/2022) Stain(s) used in Diagnosis: The following stain(s) were used in diagnosing the case: PR-ACIS, ER-ACIS. The control(s) stained appropriately. ADDITIONAL INFORMATION: 1. Breast, left, needle core biopsy. 3:00 1 cmfn, coil clip PROGNOSTIC INDICATORS Results: IMMUNOHISTOCHEMICAL AND MORPHOMETRIC ANALYSIS PERFORMED MANUALLY Estrogen Receptor: 70%, POSITIVE, MODERATE STAINING INTENSITY Progesterone Receptor: 100%, POSITIVE, STRONG STAINING INTENSITY REFERENCE RANGE ESTROGEN RECEPTOR NEGATIVE 0% POSITIVE =>1% REFERENCE RANGE PROGESTERONE RECEPTOR NEGATIVE 0% POSITIVE =>1% All controls stained appropriately Jimmy Picket MD Pathologist, Electronic Signature ( Signed 07/07/2022)  Assessment and Plan:  Diagnoses and all orders for this visit:  Ductal carcinoma in situ (DCIS) of  left breast - Ambulatory Referral to Oncology-Medical - Ambulatory Referral to Radiation Oncology - Ambulatory Referral to Plastic Surgery - Ambulatory Referral to Cancer Genetics - [facility]    Full workup is not complete yet. She does have left breast DCIS. She also is quite young. Recommend genetic testing  Recommend referral to medical oncology  Recommend referral to radiation oncology  Recommend plastic surgery referral for potential bilateral mastectomies. Once we have all the data back, we can decide on surgical path. We discussed her young age as well as recurrence rates are for lifetime which can be significant. We discussed the pros and cons of radiation as well and local regional recurrence rates of breast conserving surgery versus mastectomy reconstruction. We discussed the potential need for sentinel lymph node mapping depending on findings. Once her workup is complete, a surgical plan can be put in place  No follow-ups on file.  Hayden Rasmussen, MD

## 2022-11-09 NOTE — Anesthesia Procedure Notes (Signed)
Procedure Name: LMA Insertion Date/Time: 11/09/2022 1:45 PM  Performed by: Thornell Mule, CRNAPre-anesthesia Checklist: Patient identified, Emergency Drugs available, Suction available and Patient being monitored Patient Re-evaluated:Patient Re-evaluated prior to induction Oxygen Delivery Method: Circle system utilized Preoxygenation: Pre-oxygenation with 100% oxygen Induction Type: IV induction LMA: LMA inserted LMA Size: 4.0 Number of attempts: 1 Placement Confirmation: positive ETCO2 Tube secured with: Tape Dental Injury: Teeth and Oropharynx as per pre-operative assessment

## 2022-11-09 NOTE — Anesthesia Postprocedure Evaluation (Signed)
Anesthesia Post Note  Patient: Erin Jacobs  Procedure(s) Performed: BILATERAL BREAST LUMPECTOMY WITH RADIOACTIVE SEED LOCALIZATION; MAGTRACE INJECTION LEFT BREAST (Bilateral: Breast)     Patient location during evaluation: PACU Anesthesia Type: General Level of consciousness: awake and alert Pain management: pain level controlled Vital Signs Assessment: post-procedure vital signs reviewed and stable Respiratory status: spontaneous breathing, nonlabored ventilation and respiratory function stable Cardiovascular status: blood pressure returned to baseline and stable Postop Assessment: no apparent nausea or vomiting Anesthetic complications: no   No notable events documented.  Last Vitals:  Vitals:   11/09/22 1610 11/09/22 1611  BP: 126/81   Pulse: 73 76  Resp: 14 17  Temp:    SpO2: 100% 98%    Last Pain:  Vitals:   11/09/22 1540  TempSrc:   PainSc: Asleep                 Lowella Curb

## 2022-11-13 ENCOUNTER — Encounter (HOSPITAL_BASED_OUTPATIENT_CLINIC_OR_DEPARTMENT_OTHER): Payer: Self-pay | Admitting: Surgery

## 2022-11-14 LAB — SURGICAL PATHOLOGY

## 2022-11-21 ENCOUNTER — Encounter (HOSPITAL_COMMUNITY): Payer: Self-pay | Admitting: Emergency Medicine

## 2022-11-21 ENCOUNTER — Inpatient Hospital Stay: Payer: Medicaid Other | Attending: Hematology and Oncology | Admitting: Hematology and Oncology

## 2022-11-21 ENCOUNTER — Ambulatory Visit (HOSPITAL_COMMUNITY)
Admission: EM | Admit: 2022-11-21 | Discharge: 2022-11-21 | Disposition: A | Discharge status: Home / Self Care | Payer: Medicaid Other | Attending: Family Medicine | Admitting: Family Medicine

## 2022-11-21 DIAGNOSIS — Z202 Contact with and (suspected) exposure to infections with a predominantly sexual mode of transmission: Secondary | ICD-10-CM | POA: Diagnosis not present

## 2022-11-21 DIAGNOSIS — N760 Acute vaginitis: Secondary | ICD-10-CM | POA: Diagnosis not present

## 2022-11-21 LAB — POCT URINALYSIS DIP (MANUAL ENTRY)
Bilirubin, UA: NEGATIVE
Glucose, UA: NEGATIVE mg/dL
Leukocytes, UA: NEGATIVE
Nitrite, UA: NEGATIVE
Protein Ur, POC: NEGATIVE mg/dL
Spec Grav, UA: >=1.03 — AB (ref 1.010–1.025)
Urobilinogen, UA: 0.2 E.U./dL
pH, UA: 5.5 (ref 5.0–8.0)

## 2022-11-21 LAB — POCT URINE PREGNANCY: Preg Test, Ur: NEGATIVE

## 2022-11-21 LAB — HIV ANTIBODY (ROUTINE TESTING W REFLEX): HIV Screen 4th Generation wRfx: NONREACTIVE

## 2022-11-21 MED ORDER — FLUCONAZOLE 150 MG PO TABS: 150.0000 mg | ORAL_TABLET | ORAL | 0 refills | Status: AC

## 2022-11-21 NOTE — ED Provider Notes (Signed)
[facility]    CSN: [id number] Arrival date & time: 11/21/22  1604      History   Chief Complaint Chief Complaint  Patient presents with   Vaginal Discharge    HPI Erin Jacobs is a 27 y.o. female.    Vaginal Discharge  Here for 1 week history of vaginal itching and white cottage cheese type discharge.  No abdominal pain and no fever or vomiting.  Last menstrual cycle was April 23.    Her last HIV and syphilis screening in [employer] were in 2022.  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   Maternal varicella, non-immune 10/23/2017    Past Surgical History:  Procedure Laterality Date   BREAST BIOPSY Left 07/04/2022   [place] LT BREAST BX W LOC DEV 1ST LESION IMG BX SPEC US GUIDE 07/04/2022 [facility]   BREAST BIOPSY Right 07/14/2022   times 3   BREAST BIOPSY Right 07/14/2022   [place] RT BREAST BX W LOC DEV 1ST LESION IMG BX SPEC US GUIDE 07/14/2022 [facility]   BREAST BIOPSY Right 07/14/2022   [place] RT BREAST BX W LOC DEV EA ADD LESION IMG BX SPEC US GUIDE 07/14/2022 [facility]   BREAST BIOPSY  11/07/2022   MM RT RADIOACTIVE SEED LOC MAMMO GUIDE 11/07/2022 [facility]   BREAST BIOPSY Left 11/07/2022   [place] LT RADIOACTIVE SEED LOC 11/07/2022 [facility]   BREAST LUMPECTOMY WITH RADIOACTIVE SEED LOCALIZATION Bilateral 11/09/2022   Procedure: BILATERAL BREAST LUMPECTOMY WITH RADIOACTIVE SEED LOCALIZATION; MAGTRACE INJECTION LEFT BREAST;  Surgeon: Harriette Bouillon, MD;  Location: [facility];  Service: General;  Laterality: Bilateral;   leg suregry- childhood      OB History     Gravida  5    Para  4   Term  4   Preterm  0   AB  1   Living  4      SAB  1   IAB  0   Ectopic  0   Multiple  0   Live Births  4            Home Medications    Prior to Admission medications   Medication Sig Start Date End Date Taking? Authorizing Provider  fluconazole (DIFLUCAN) 150 MG tablet Take 1 tablet (150 mg total) by mouth every 3 (three) days for 2 doses. 11/21/22 11/25/22 Yes Janie Strothman, Janace Aris, MD  Etonogestrel [facility]) Inject into the skin.    [provider]  ibuprofen (ADVIL) 800 MG tablet Take 1 tablet (800 mg total) by mouth every 8 (eight) hours as needed. 11/09/22   Cornett, Maisie Fus, MD  oxyCODONE (OXY IR/ROXICODONE) 5 MG immediate release tablet Take 1 tablet (5 mg total) by mouth every 6 (six) hours as needed for severe pain. 11/09/22   Harriette Bouillon, MD    Family History Family History  Problem Relation Age of Onset   Cervical cancer Mother 42   Heart disease Father    Brain cancer Maternal Grandmother 21   Heart attack Maternal Grandfather    Diabetes Maternal Aunt  Diabetes Maternal Uncle    Hypertension Maternal Uncle    Stroke Maternal Uncle    Prostate cancer Other        MGMs brother   Throat cancer Other        MGMs brother    Social History Social History   Tobacco Use   Smoking status: Never   Smokeless tobacco: Never  Vaping Use   Vaping Use: Former  Substance Use Topics   Alcohol use: Yes    Comment: rarely   Drug use: Not Currently     Allergies   Patient has no known allergies.   Review of Systems Review of Systems  Genitourinary:  Positive for vaginal discharge.     Physical Exam Triage Vital Signs ED Triage Vitals  Enc Vitals Group     BP 11/21/22 1656 115/79     Pulse Rate 11/21/22 1656 71     Resp 11/21/22 1656 16     Temp 11/21/22 1656 98.4 F (36.9 C)     Temp Source 11/21/22 1656 Oral     SpO2 11/21/22 1656 98 %     Weight --      Height --      Head Circumference --      Peak Flow  --      Pain Score 11/21/22 1657 0     Pain Loc --      Pain Edu? --      Excl. in GC? --    No data found.  Updated Vital Signs BP 115/79 (BP Location: Left Arm)   Pulse 71   Temp 98.4 F (36.9 C) (Oral)   Resp 16   LMP 11/07/2022 (Exact Date)   SpO2 98%   Visual Acuity Right Eye Distance:   Left Eye Distance:   Bilateral Distance:    Right Eye Near:   Left Eye Near:    Bilateral Near:     Physical Exam Vitals reviewed.  Constitutional:      General: She is not in acute distress.    Appearance: She is not ill-appearing, toxic-appearing or diaphoretic.  HENT:     Mouth/Throat:     Mouth: Mucous membranes are moist.  Eyes:     Extraocular Movements: Extraocular movements intact.     Pupils: Pupils are equal, round, and reactive to light.  Cardiovascular:     Rate and Rhythm: Normal rate and regular rhythm.  Pulmonary:     Effort: Pulmonary effort is normal.     Breath sounds: Normal breath sounds.  Skin:    Coloration: Skin is not pale.  Neurological:     Mental Status: She is alert and oriented to person, place, and time.  Psychiatric:        Behavior: Behavior normal.      UC Treatments / Results  Labs (all labs ordered are listed, but only abnormal results are displayed) Labs Reviewed  POCT URINALYSIS DIP (MANUAL ENTRY) - Abnormal; Notable for the following components:      Result Value   Color, UA yellow (*)    Clarity, UA hazy (*)    Ketones, POC UA trace (5) (*)    Spec Grav, UA >=1.030 (*)    Blood, UA small (*)    All other components within normal limits  HIV ANTIBODY (ROUTINE TESTING W REFLEX)  RPR  POCT URINE PREGNANCY  CERVICOVAGINAL ANCILLARY ONLY    EKG   Radiology No results found.  Procedures Procedures (including critical care time)  Medications Ordered in UC Medications - No data to display  Initial Impression / Assessment and Plan / UC Course  I have reviewed the triage vital signs and the nursing notes.  Pertinent  labs & imaging results that were available during my care of the patient were reviewed by me and considered in my medical decision making (see chart for details).       Staff will notify her of any positives on the vaginal self swab done today or on the lab work drawn today.  Fluconazole is sent in to treat possible yeast infection empirically. UPT is negative Urinalysis shows a small amount of blood, but there are no leukocytes or nitrites.  I think this is her menstrual flow finishing.   Final Clinical Impressions(s) / UC Diagnoses   Final diagnoses:  Acute vaginitis  Exposure to STD     Discharge Instructions      Urine pregnancy test is negative  Urinalysis has some blood, which I think is from your recent menstrual cycle.  There are no white blood cells and no nitrites, so there does not appear to be a urine infection  Staff will notify you of anything positive on the swab or the blood work  Take fluconazole 150 mg--1 tablet every 3 days for 2 doses       ED Prescriptions     Medication Sig Dispense Auth. Provider   fluconazole (DIFLUCAN) 150 MG tablet Take 1 tablet (150 mg total) by mouth every 3 (three) days for 2 doses. 2 tablet Marlinda Mike Janace Aris, MD      PDMP not reviewed this encounter.   Zenia Resides, MD 11/21/22 [phone]

## 2022-11-21 NOTE — ED Triage Notes (Signed)
Pt reports thick white vaginal discharge and itching x 1 week. States she has a hx of yeast infections.

## 2022-11-21 NOTE — Discharge Instructions (Signed)
Urine pregnancy test is negative  Urinalysis has some blood, which I think is from your recent menstrual cycle.  There are no white blood cells and no nitrites, so there does not appear to be a urine infection  Staff will notify you of anything positive on the swab or the blood work  Take fluconazole 150 mg--1 tablet every 3 days for 2 doses

## 2022-11-21 NOTE — Assessment & Plan Note (Deleted)
07/04/22: Patient presented with mastitis and palpable abnormality. 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 I discussed the margins.  We also discussed the final staging along with previously performed ER/PR testing.  Treatment plan: Adjuvant radiation Adjuvant tamoxifen x 5 years  Patient works as a Merchandiser, retail at [employer]. She has 4 children 3 sons and baby girl aged 1 years.  Return to clinic after radiation is complete

## 2022-11-22 ENCOUNTER — Telehealth (HOSPITAL_COMMUNITY): Payer: Self-pay

## 2022-11-22 LAB — CERVICOVAGINAL ANCILLARY ONLY
Bacterial Vaginitis (gardnerella): POSITIVE — AB
Candida Glabrata: NEGATIVE
Candida Vaginitis: POSITIVE — AB
Chlamydia: NEGATIVE
Comment: NEGATIVE
Comment: NEGATIVE
Comment: NEGATIVE
Comment: NEGATIVE
Comment: NEGATIVE
Comment: NORMAL
Neisseria Gonorrhea: NEGATIVE
Trichomonas: NEGATIVE

## 2022-11-22 LAB — RPR: RPR Ser Ql: NONREACTIVE

## 2022-11-22 MED ORDER — METRONIDAZOLE 500 MG PO TABS: 500.0000 mg | ORAL_TABLET | Freq: Two times a day (BID) | ORAL | 0 refills | Status: DC

## 2022-11-24 ENCOUNTER — Encounter: Payer: Self-pay | Admitting: *Deleted

## 2022-12-04 ENCOUNTER — Encounter: Payer: Self-pay | Admitting: *Deleted

## 2022-12-08 ENCOUNTER — Ambulatory Visit: Payer: Medicaid Other

## 2022-12-08 ENCOUNTER — Ambulatory Visit: Payer: Medicaid Other | Admitting: Radiation Oncology

## 2023-01-15 ENCOUNTER — Inpatient Hospital Stay: Admission: RE | Admit: 2023-01-15 | Payer: Medicaid Other | Source: Ambulatory Visit

## 2023-03-21 ENCOUNTER — Encounter (INDEPENDENT_AMBULATORY_CARE_PROVIDER_SITE_OTHER): Payer: Medicaid Other | Admitting: Ophthalmology

## 2023-03-21 ENCOUNTER — Encounter (INDEPENDENT_AMBULATORY_CARE_PROVIDER_SITE_OTHER): Payer: Self-pay

## 2023-03-21 DIAGNOSIS — H3581 Retinal edema: Secondary | ICD-10-CM

## 2023-08-05 ENCOUNTER — Encounter (HOSPITAL_COMMUNITY): Payer: Self-pay

## 2023-08-05 ENCOUNTER — Ambulatory Visit (HOSPITAL_COMMUNITY)
Admission: EM | Admit: 2023-08-05 | Discharge: 2023-08-05 | Disposition: A | Discharge status: Home / Self Care | Payer: 59 | Attending: Emergency Medicine | Admitting: Emergency Medicine

## 2023-08-05 DIAGNOSIS — Z113 Encounter for screening for infections with a predominantly sexual mode of transmission: Secondary | ICD-10-CM | POA: Diagnosis not present

## 2023-08-05 LAB — HIV ANTIBODY (ROUTINE TESTING W REFLEX): HIV Screen 4th Generation wRfx: NONREACTIVE

## 2023-08-05 NOTE — ED Triage Notes (Signed)
Routine testing. No known exposure or symptoms. Patient requesting swab and blood work.

## 2023-08-05 NOTE — ED Provider Notes (Signed)
[facility]    CSN: [id number] Arrival date & time: 08/05/23  1108      History   Chief Complaint Chief Complaint  Patient presents with   SEXUALLY TRANSMITTED DISEASE    HPI Erin Jacobs is a 28 y.o. female.   Patient presents for routine STD screening.  Denies any symptoms or known exposures.     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   Maternal varicella, non-immune 10/23/2017    Past Surgical History:  Procedure Laterality Date   BREAST BIOPSY Left 07/04/2022   [place] LT BREAST BX W LOC DEV 1ST LESION IMG BX SPEC US GUIDE 07/04/2022 [facility]   BREAST BIOPSY Right 07/14/2022   times 3   BREAST BIOPSY Right 07/14/2022   [place] RT BREAST BX W LOC DEV 1ST LESION IMG BX SPEC US GUIDE 07/14/2022 [facility]   BREAST BIOPSY Right 07/14/2022   [place] RT BREAST BX W LOC DEV EA ADD LESION IMG BX SPEC US GUIDE 07/14/2022 [facility]   BREAST BIOPSY  11/07/2022   MM RT RADIOACTIVE SEED LOC MAMMO GUIDE 11/07/2022 [facility]   BREAST BIOPSY Left 11/07/2022   [place] LT RADIOACTIVE SEED LOC 11/07/2022 [facility]   BREAST LUMPECTOMY WITH RADIOACTIVE SEED LOCALIZATION Bilateral 11/09/2022   Procedure: BILATERAL BREAST LUMPECTOMY WITH RADIOACTIVE SEED LOCALIZATION; MAGTRACE INJECTION LEFT BREAST;  Surgeon: Harriette Bouillon, MD;  Location: [facility];  Service: General;  Laterality: Bilateral;   leg suregry- childhood      OB History     Gravida  5   Para  4   Term  4   Preterm  0   AB  1   Living  4      SAB  1   IAB  0   Ectopic  0   Multiple  0   Live Births  4             Home Medications    Prior to Admission medications   Medication Sig Start Date End Date Taking? Authorizing Provider  Etonogestrel (NEXPLANON [place]) Inject into the skin.    [provider]    Family History Family History  Problem Relation Age of Onset   Cervical cancer Mother 48   Heart disease Father    Brain cancer Maternal Grandmother 61   Heart attack Maternal Grandfather    Diabetes Maternal Aunt    Diabetes Maternal Uncle    Hypertension Maternal Uncle    Stroke Maternal Uncle    Prostate cancer Other        MGMs brother   Throat cancer Other        MGMs brother    Social History Social History   Tobacco Use   Smoking status: Never   Smokeless tobacco: Never  Vaping Use   Vaping status: Former  Substance Use Topics   Alcohol use: Yes    Comment: rarely   Drug use: Not Currently     Allergies   Patient has no known allergies.   Review of Systems Review of Systems  All  other systems reviewed and are negative.    Physical Exam Triage Vital Signs ED Triage Vitals  Encounter Vitals Group     BP 08/05/23 1129 104/74     Systolic BP Percentile --      Diastolic BP Percentile --      Pulse Rate 08/05/23 1129 (!) 104     Resp 08/05/23 1129 16     Temp 08/05/23 1129 98 F (36.7 C)     Temp Source 08/05/23 1129 Oral     SpO2 08/05/23 1129 96 %     Weight 08/05/23 1128 218 lb (98.9 kg)     Height 08/05/23 1128 5\' 6"  (1.676 m)     Head Circumference --      Peak Flow --      Pain Score 08/05/23 1128 0     Pain Loc --      Pain Education --      Exclude from Growth Chart --    No data found.  Updated Vital Signs BP 104/74 (BP Location: Left Arm)   Pulse (!) 104   Temp 98 F (36.7 C) (Oral)   Resp 16   Ht 5\' 6"  (1.676 m)   Wt 218 lb (98.9 kg)   LMP 07/21/2023 (Approximate)   SpO2 96%   BMI 35.19 kg/m   Visual Acuity Right Eye Distance:   Left Eye Distance:   Bilateral Distance:    Right Eye Near:   Left Eye Near:     Bilateral Near:     Physical Exam Vitals and nursing note reviewed.  Constitutional:      General: She is awake. She is not in acute distress.    Appearance: Normal appearance. She is well-developed and well-groomed. She is not ill-appearing.  Genitourinary:    Comments: Exam deferred. Neurological:     Mental Status: She is alert.  Psychiatric:        Behavior: Behavior is cooperative.      UC Treatments / Results  Labs (all labs ordered are listed, but only abnormal results are displayed) Labs Reviewed  HIV ANTIBODY (ROUTINE TESTING W REFLEX)  RPR  CERVICOVAGINAL ANCILLARY ONLY    EKG   Radiology No results found.  Procedures Procedures (including critical care time)  Medications Ordered in UC Medications - No data to display  Initial Impression / Assessment and Plan / UC Course  I have reviewed the triage vital signs and the nursing notes.  Pertinent labs & imaging results that were available during my care of the patient were reviewed by me and considered in my medical decision making (see chart for details).     Patient presented for routine STD screening.  Denies any symptoms or known exposures.  No significant findings upon assessment.  GU exam deferred.  Patient perform self swab for STD.  HIV and syphilis testing ordered.  Discussed follow-up and return precautions. Final Clinical Impressions(s) / UC Diagnoses   Final diagnoses:  Screening examination for STD (sexually transmitted disease)     Discharge Instructions      Your results will return over the next few days and someone will call you if results are positive and require treatment.  Return here as needed.    ED Prescriptions   None    PDMP not reviewed this encounter.   Wynonia Lawman A, NP 08/05/23 1218

## 2023-08-05 NOTE — Discharge Instructions (Signed)
Your results will return over the next few days and someone will call you if results are positive and require treatment.  Return here as needed.

## 2023-08-06 LAB — CERVICOVAGINAL ANCILLARY ONLY
Chlamydia: NEGATIVE
Comment: NEGATIVE
Comment: NEGATIVE
Comment: NORMAL
Neisseria Gonorrhea: NEGATIVE
Trichomonas: NEGATIVE

## 2023-08-06 LAB — RPR: RPR Ser Ql: NONREACTIVE

## 2023-12-20 ENCOUNTER — Ambulatory Visit: Admitting: Obstetrics and Gynecology

## 2023-12-20 ENCOUNTER — Encounter: Payer: Self-pay | Admitting: Obstetrics and Gynecology

## 2023-12-20 VITALS — BP 130/73 | HR 86 | Ht 66.0 in | Wt 226.0 lb

## 2023-12-20 DIAGNOSIS — Z3046 Encounter for surveillance of implantable subdermal contraceptive: Secondary | ICD-10-CM

## 2023-12-20 MED ORDER — ETONOGESTREL 68 MG ~~LOC~~ IMPL
68.0000 mg | DRUG_IMPLANT | Freq: Once | SUBCUTANEOUS | Status: AC
  Administered 2023-12-20: 68 mg via SUBCUTANEOUS

## 2023-12-20 NOTE — Patient Instructions (Signed)
 Nexplanon Instructions After Insertion  Keep bandage clean and dry for 24 hours  May use ice/Tylenol/Ibuprofen for soreness or pain  If you develop fever, drainage or increased warmth from incision site-contact office immediately

## 2023-12-20 NOTE — Progress Notes (Signed)
 GYNECOLOGY OFFICE NEXPLANON  REMOVAL/INSERTION PROCEDURE NOTE  Ms. Erin Jacobs is a [age] female in the office today for removal and re-insertion of Nexplanon ; which was inserted on 01/19/2021 at [facility] after delivery. She has no complaints today.  BP 104/69   Pulse 72   Ht 4\' 10"  (1.473 m)   Wt 101 lb (45.8 kg)   BMI 21.11 kg/m    Nexplanon  Removal Procedure Note: Consent obtained and Time-Out conducted Implant palpated in LT upper arm Betadine prep done on area of excision/removal Lidocaine  infiltrated into intradermal and subcutaneous space Small 2mm incision made with scalpel Pressure applied to distal end of implant which exposed the tip through incision Tip of implant grasped with hemostat There was some adherence of implant in subcutaneous tissue.   Twisting and manipulation freed the implant from the capsule Implant removed Pressure held on incision until bleeding stopped Patient tolerated procedure well.    Nexplanon  Insertion Procedure: Patient identified, informed consent performed, consent signed. Patient does understand that irregular bleeding is a very common side effect of this medication. She was advised to have backup contraception for one week after placement. Pregnancy test in clinic today was negative.  Appropriate time out taken.  Patient's left arm was prepped and draped in the usual sterile fashion. The ruler used to measure and mark insertion area.  Patient was prepped with alcohol swab and then injected with 3 ml of 1% lidocaine .  She was prepped with betadine, Nexplanon  removed from packaging,  Device confirmed in needle, then inserted full length of needle and withdrawn per handbook instructions. Nexplanon  was able to palpated in the patient's arm; patient palpated the insert herself. There was minimal blood loss. Skin not well-approximated using steri-strips alone. 3.0 vicryl used to approximate skin with figure of 8 stitch.  Steri-strips applied with gentle traction to the skin. Insertion site covered with bandaid and a pressure bandage to reduce any bruising.  The patient tolerated the procedure well and was given post procedure instructions. Follow-up via My Chart video visit , unless having problems and need to be seen in the office.  Nexplanon  Lot#: [id number] / Expiration Date: 12/2025    Assessment and Plan: Encounter for surveillance of contraceptive device  - Return in 1 month for check-up - Information provided on Nexplanon  After Care Instructions - Patient verbalized an understanding of the plan of care and agrees.   Total face-to-face time spent during this encounter was 10 minutes. There was 5 minutes of chart review time spent prior to this encounter. Total time spent = 15 minutes.    Ania Levay, CNM  12/20/2023  10:38 AM

## 2023-12-20 NOTE — Progress Notes (Signed)
 Pt presents for Nexplanon  removal/ reinsertion. Nexplanon  placed 01/2021  Last PAP 06/2020

## 2023-12-20 NOTE — Addendum Note (Signed)
 Addended by: Whitfield Hamper on: 12/20/2023 11:56 AM   Modules accepted: Orders
